# Patient Record
Sex: Male | Born: 1965
Health system: Southern US, Community
[De-identification: ages and names within clinical notes are randomized; demographics above are authoritative.]

## PROBLEM LIST (undated history)

## (undated) DIAGNOSIS — D649 Anemia, unspecified: Secondary | ICD-10-CM

## (undated) DIAGNOSIS — M1990 Unspecified osteoarthritis, unspecified site: Secondary | ICD-10-CM

## (undated) DIAGNOSIS — Z86010 Personal history of colonic polyps: Secondary | ICD-10-CM

## (undated) DIAGNOSIS — J45909 Unspecified asthma, uncomplicated: Secondary | ICD-10-CM

## (undated) DIAGNOSIS — E785 Hyperlipidemia, unspecified: Secondary | ICD-10-CM

## (undated) DIAGNOSIS — K603 Anal fistula: Secondary | ICD-10-CM

## (undated) DIAGNOSIS — T8859XA Other complications of anesthesia, initial encounter: Secondary | ICD-10-CM

## (undated) DIAGNOSIS — Z8679 Personal history of other diseases of the circulatory system: Secondary | ICD-10-CM

## (undated) DIAGNOSIS — G4730 Sleep apnea, unspecified: Secondary | ICD-10-CM

## (undated) DIAGNOSIS — H1030 Unspecified acute conjunctivitis, unspecified eye: Secondary | ICD-10-CM

## (undated) DIAGNOSIS — A0472 Enterocolitis due to Clostridium difficile, not specified as recurrent: Secondary | ICD-10-CM

## (undated) DIAGNOSIS — I10 Essential (primary) hypertension: Secondary | ICD-10-CM

## (undated) DIAGNOSIS — J019 Acute sinusitis, unspecified: Secondary | ICD-10-CM

## (undated) DIAGNOSIS — I491 Atrial premature depolarization: Secondary | ICD-10-CM

## (undated) DIAGNOSIS — M109 Gout, unspecified: Secondary | ICD-10-CM

## (undated) HISTORY — DX: Hyperlipidemia, unspecified: E78.5

## (undated) HISTORY — DX: Anal fistula: K60.3

## (undated) HISTORY — DX: Enterocolitis due to Clostridium difficile, not specified as recurrent: A04.72

## (undated) HISTORY — PX: OTHER SURGICAL HISTORY: SHX169

## (undated) HISTORY — DX: Personal history of colonic polyps: Z86.010

## (undated) HISTORY — PX: ANKLE DEBRIDEMENT: SHX1147

## (undated) HISTORY — DX: Essential (primary) hypertension: I10

## (undated) HISTORY — DX: Unspecified acute conjunctivitis, unspecified eye: H10.30

## (undated) HISTORY — DX: Atrial premature depolarization: I49.1

## (undated) HISTORY — DX: Personal history of other diseases of the circulatory system: Z86.79

## (undated) HISTORY — DX: Gout, unspecified: M10.9

## (undated) HISTORY — DX: Acute sinusitis, unspecified: J01.90

---

## 2006-09-10 ENCOUNTER — Encounter: Payer: Self-pay | Admitting: Internal Medicine

## 2006-10-09 ENCOUNTER — Encounter: Payer: Self-pay | Admitting: Internal Medicine

## 2008-05-24 ENCOUNTER — Encounter: Payer: Self-pay | Admitting: Internal Medicine

## 2008-07-04 ENCOUNTER — Encounter: Payer: Self-pay | Admitting: Internal Medicine

## 2008-09-28 ENCOUNTER — Encounter: Payer: Self-pay | Admitting: Internal Medicine

## 2008-09-29 ENCOUNTER — Encounter: Payer: Self-pay | Admitting: Internal Medicine

## 2009-03-16 ENCOUNTER — Encounter: Payer: Self-pay | Admitting: Internal Medicine

## 2009-05-18 ENCOUNTER — Encounter: Payer: Self-pay | Admitting: Internal Medicine

## 2009-08-23 ENCOUNTER — Encounter: Payer: Self-pay | Admitting: Internal Medicine

## 2009-08-23 LAB — CONVERTED CEMR LAB: PSA: NORMAL ng/mL

## 2009-12-21 ENCOUNTER — Telehealth: Payer: Self-pay | Admitting: Internal Medicine

## 2009-12-21 ENCOUNTER — Ambulatory Visit: Payer: Self-pay | Admitting: Internal Medicine

## 2009-12-21 DIAGNOSIS — Z860100 Personal history of colon polyps, unspecified: Secondary | ICD-10-CM | POA: Insufficient documentation

## 2009-12-21 DIAGNOSIS — K603 Anal fistula, unspecified: Secondary | ICD-10-CM

## 2009-12-21 DIAGNOSIS — E785 Hyperlipidemia, unspecified: Secondary | ICD-10-CM | POA: Insufficient documentation

## 2009-12-21 DIAGNOSIS — M109 Gout, unspecified: Secondary | ICD-10-CM

## 2009-12-21 DIAGNOSIS — I10 Essential (primary) hypertension: Secondary | ICD-10-CM

## 2009-12-21 DIAGNOSIS — I491 Atrial premature depolarization: Secondary | ICD-10-CM

## 2009-12-21 HISTORY — DX: Atrial premature depolarization: I49.1

## 2009-12-21 HISTORY — DX: Essential (primary) hypertension: I10

## 2009-12-21 HISTORY — DX: Hyperlipidemia, unspecified: E78.5

## 2009-12-21 HISTORY — DX: Personal history of colon polyps, unspecified: Z86.0100

## 2009-12-21 HISTORY — DX: Personal history of colonic polyps: Z86.010

## 2009-12-21 HISTORY — DX: Anal fistula: K60.3

## 2009-12-21 HISTORY — DX: Gout, unspecified: M10.9

## 2009-12-21 HISTORY — DX: Anal fistula, unspecified: K60.30

## 2009-12-21 LAB — CONVERTED CEMR LAB
AST: 41 units/L — ABNORMAL HIGH (ref 0–37)
Alkaline Phosphatase: 56 units/L (ref 39–117)
Bilirubin, Direct: 0.1 mg/dL (ref 0.0–0.3)
Cholesterol: 251 mg/dL — ABNORMAL HIGH (ref 0–200)
Total Bilirubin: 0.6 mg/dL (ref 0.3–1.2)
Total CHOL/HDL Ratio: 6
Uric Acid, Serum: 5.3 mg/dL (ref 4.0–7.8)
VLDL: 179.6 mg/dL — ABNORMAL HIGH (ref 0.0–40.0)

## 2010-01-23 ENCOUNTER — Ambulatory Visit: Payer: Self-pay | Admitting: Internal Medicine

## 2010-01-23 LAB — CONVERTED CEMR LAB
AST: 28 units/L (ref 0–37)
Alkaline Phosphatase: 43 units/L (ref 39–117)
Bilirubin, Direct: 0.2 mg/dL (ref 0.0–0.3)
Cholesterol: 157 mg/dL (ref 0–200)
LDL Cholesterol: 81 mg/dL (ref 0–99)
Total Protein: 6.6 g/dL (ref 6.0–8.3)

## 2010-01-24 LAB — CONVERTED CEMR LAB
HCV Ab: NEGATIVE
Hep A IgM: NEGATIVE

## 2010-04-11 ENCOUNTER — Ambulatory Visit: Payer: Self-pay | Admitting: Internal Medicine

## 2010-04-11 DIAGNOSIS — J0101 Acute recurrent maxillary sinusitis: Secondary | ICD-10-CM | POA: Insufficient documentation

## 2010-04-11 DIAGNOSIS — H1030 Unspecified acute conjunctivitis, unspecified eye: Secondary | ICD-10-CM | POA: Insufficient documentation

## 2010-04-11 DIAGNOSIS — J019 Acute sinusitis, unspecified: Secondary | ICD-10-CM

## 2010-04-11 HISTORY — DX: Unspecified acute conjunctivitis, unspecified eye: H10.30

## 2010-04-11 HISTORY — DX: Acute sinusitis, unspecified: J01.90

## 2010-04-19 ENCOUNTER — Ambulatory Visit: Payer: Self-pay | Admitting: Internal Medicine

## 2010-04-19 DIAGNOSIS — A0472 Enterocolitis due to Clostridium difficile, not specified as recurrent: Secondary | ICD-10-CM

## 2010-04-19 HISTORY — DX: Enterocolitis due to Clostridium difficile, not specified as recurrent: A04.72

## 2010-04-20 LAB — CONVERTED CEMR LAB
ALT: 41 units/L (ref 0–53)
Basophils Relative: 1 % (ref 0.0–3.0)
Bilirubin Urine: NEGATIVE
Chloride: 108 meq/L (ref 96–112)
Eosinophils Relative: 2.6 % (ref 0.0–5.0)
HCT: 38 % — ABNORMAL LOW (ref 39.0–52.0)
Hemoglobin, Urine: NEGATIVE
Leukocytes, UA: NEGATIVE
Lipase: 13 units/L (ref 11.0–59.0)
Lymphs Abs: 2.2 10*3/uL (ref 0.7–4.0)
MCV: 91.9 fL (ref 78.0–100.0)
Monocytes Absolute: 0.4 10*3/uL (ref 0.1–1.0)
Neutro Abs: 3.3 10*3/uL (ref 1.4–7.7)
Nitrite: NEGATIVE
Potassium: 4.4 meq/L (ref 3.5–5.1)
RBC: 4.14 M/uL — ABNORMAL LOW (ref 4.22–5.81)
Total Protein, Urine: NEGATIVE mg/dL
Total Protein: 6.8 g/dL (ref 6.0–8.3)
WBC: 6.1 10*3/uL (ref 4.5–10.5)

## 2010-04-24 ENCOUNTER — Encounter: Payer: Self-pay | Admitting: Internal Medicine

## 2010-08-09 ENCOUNTER — Ambulatory Visit: Payer: Self-pay | Admitting: Internal Medicine

## 2010-08-09 ENCOUNTER — Encounter: Payer: Self-pay | Admitting: Internal Medicine

## 2010-08-09 LAB — CONVERTED CEMR LAB
BUN: 17 mg/dL (ref 6–23)
Basophils Absolute: 0.1 10*3/uL (ref 0.0–0.1)
Bilirubin, Direct: 0.1 mg/dL (ref 0.0–0.3)
Chloride: 107 meq/L (ref 96–112)
Creatinine, Ser: 0.8 mg/dL (ref 0.4–1.5)
Eosinophils Absolute: 0.3 10*3/uL (ref 0.0–0.7)
Eosinophils Relative: 4.2 % (ref 0.0–5.0)
HDL: 43.5 mg/dL (ref >39.00)
Hemoglobin, Urine: NEGATIVE
Ketones, ur: NEGATIVE mg/dL
MCV: 90.2 fL (ref 78.0–100.0)
Monocytes Absolute: 0.4 10*3/uL (ref 0.1–1.0)
Neutrophils Relative %: 60.1 % (ref 43.0–77.0)
Platelets: 266 10*3/uL (ref 150.0–400.0)
TSH: 1.16 microintl units/mL (ref 0.35–5.50)
Total Bilirubin: 0.5 mg/dL (ref 0.3–1.2)
Total CHOL/HDL Ratio: 4
Triglycerides: 239 mg/dL — ABNORMAL HIGH (ref 0.0–149.0)
Urine Glucose: NEGATIVE mg/dL
Urobilinogen, UA: 0.2 (ref 0.0–1.0)
VLDL: 47.8 mg/dL — ABNORMAL HIGH (ref 0.0–40.0)
WBC: 7.1 10*3/uL (ref 4.5–10.5)

## 2010-12-24 NOTE — Letter (Signed)
Summary: Tereasa Coop MD  Tereasa Coop MD   Imported By: Sherian Rein 12/25/2009 14:46:42  _____________________________________________________________________  External Attachment:    Type:   Image     Comment:   External Document

## 2010-12-24 NOTE — Assessment & Plan Note (Signed)
Summary: CPX / [facility]  #   Vital Signs:  Patient profile:   45 year old male Height:      71 inches Weight:      263.13 pounds BMI:     36.83 O2 Sat:      95 % on Room air Temp:     98.5 degrees F oral Pulse rate:   59 / minute BP sitting:   100 / 70  (left arm) Cuff size:   large  Vitals Entered By: Zella Ball Ewing CMA Duncan Dull) (August 09, 2010 8:41 AM)  O2 Flow:  Room air  CC: Adult Physical/RE   CC:  Adult Physical/RE.  History of Present Illness: here for wellness; crestor costs $500 for 3 mo;  Pt denies CP, worsening sob, doe, wheezing, orthopnea, pnd, worsening LE edema, palps, dizziness or syncope Pt denies new neuro symptoms such as headache, facial or extremity weakness  No fever, wt loss, night sweats, loss of appetite or other constitutional symptoms  No overt OSA symptoms.   Problems Prior to Update: 1)  Preventive Health Care  (ICD-V70.0) 2)  Clostridium Difficile Colitis  (ICD-008.45) 3)  Sinusitis- Acute-nos  (ICD-461.9) 4)  Conjunctivitis, Acute  (ICD-372.00) 5)  Hepatotoxicity, Drug-induced, Risk of  (ICD-V58.69) 6)  Anal Fistula  (ICD-565.1) 7)  Colonic Polyps, Hx of  (ICD-V12.72) 8)  Pac  (ICD-427.61) 9)  Hypertension  (ICD-401.9) 10)  Hyperlipidemia  (ICD-272.4) 11)  Gout  (ICD-274.9)  Medications Prior to Update: 1)  Ambien 10 Mg Tabs (Zolpidem Tartrate) .Marland Kitchen.. 1 By Mouth As Needed 2)  Lisinopril 10 Mg Tabs (Lisinopril) .Marland Kitchen.. 1 By Mouth Once Daily 3)  Allopurinol 300 Mg Tabs (Allopurinol) .Marland Kitchen.. 1 By Mouth Once Daily 4)  Crestor 10 Mg Tabs (Rosuvastatin Calcium) .Marland Kitchen.. 1 By Mouth Once Daily 5)  Metoprolol Tartrate 50 Mg Tabs (Metoprolol Tartrate) .Marland Kitchen.. 1 By Mouth Once Daily 6)  Aspir-Low 81 Mg Tbec (Aspirin) .Marland Kitchen.. 1 By Mouth Once Daily 7)  Daily Multiple Vitamins  Tabs (Multiple Vitamin) .Marland Kitchen.. 1 By Mouth Once Daily 8)  Fenofibrate 160 Mg Tabs (Fenofibrate) .Marland Kitchen.. 1 By Mouth Once Daily 9)  Fish Oil 1000 Mg Caps (Omega-3 Fatty Acids) .Marland Kitchen.. 1 By Mouth Once Daily 10)   Metronidazole 250 Mg Tabs (Metronidazole) .Marland Kitchen.. 1 By Mouth Four Times Per Day 11)  Vancocin Hcl 250 Mg Caps (Vancomycin Hcl) .Marland Kitchen.. 1 By Mouth Four Times Per Day For 14 Days 12)  Promethazine Hcl 25 Mg Supp (Promethazine Hcl) .Marland Kitchen.. 1 Pr Q 6 Hrs As Needed Nausea  Current Medications (verified): 1)  Zolpidem Tartrate 10 Mg Tabs (Zolpidem Tartrate) .Marland Kitchen.. 1 By Mouth At Bedtime As Needed 2)  Lisinopril 10 Mg Tabs (Lisinopril) .Marland Kitchen.. 1 By Mouth Once Daily 3)  Allopurinol 300 Mg Tabs (Allopurinol) .Marland Kitchen.. 1 By Mouth Once Daily 4)  Simvastatin 40 Mg Tabs (Simvastatin) .Marland Kitchen.. 1po Once Daily 5)  Metoprolol Tartrate 50 Mg Tabs (Metoprolol Tartrate) .... 1/2 By Mouth Once Daily 6)  Aspir-Low 81 Mg Tbec (Aspirin) .Marland Kitchen.. 1 By Mouth Once Daily 7)  Daily Multiple Vitamins  Tabs (Multiple Vitamin) .Marland Kitchen.. 1 By Mouth Once Daily 8)  Fenofibrate 160 Mg Tabs (Fenofibrate) .Marland Kitchen.. 1 By Mouth Once Daily 9)  Fish Oil 1000 Mg Caps (Omega-3 Fatty Acids) .Marland Kitchen.. 1 By Mouth Once Daily  Allergies (verified): 1)  * Avelox  Past History:  Past Medical History: Last updated: 12/21/2009 Gout Hyperlipidemia Hypertension hx of C Diff colitis after avelox 2009 PAC's PFT's normal 2007 Colonic polyps, hx of  hx of pneumonia chronic right shoulder pain   Past Surgical History: Last updated: 12/21/2009 s/p anal lateral inferior sphincterotomy from c diff, subsequent fistula s/p right ankle ORIF about 45yo  Family History: Last updated: 12/21/2009 adopted; biol mother native [place], father cuacasion  Social History: Last updated: 12/21/2009 Single no children work - Therapist, music - Arts development officer Never Smoked Alcohol use-yes Drug use-no  Risk Factors: Smoking Status: never (12/21/2009)  Review of Systems  The patient denies anorexia, fever, vision loss, decreased hearing, hoarseness, chest pain, syncope, dyspnea on exertion, peripheral edema, prolonged cough, headaches, hemoptysis, abdominal pain, melena, hematochezia,  severe indigestion/heartburn, hematuria, muscle weakness, suspicious skin lesions, transient blindness, difficulty walking, depression, unusual weight change, abnormal bleeding, enlarged lymph nodes, and angioedema.         all otherwise negative per pt -    Physical Exam  General:  alert and overweight-appearing. Head:  normocephalic and atraumatic.   Eyes:  vision grossly intact, pupils equal, and pupils round.   Ears:  R ear normal and L ear normal.   Nose:  no external deformity and no nasal discharge.   Mouth:  no gingival abnormalities and pharyngeal erythema.   Neck:  supple and no masses.   Lungs:  normal respiratory effort and normal breath sounds.   Heart:  normal rate and regular rhythm.   Abdomen:  soft, normal bowel sounds, no distention, no masses, and no guarding Msk:  no joint tenderness and no joint swelling.   Extremities:  no edema, no erythema  Neurologic:  cranial nerves II-XII intact and strength normal in all extremities.   Skin:  color normal and no rashes.   Psych:  not anxious appearing and not depressed appearing.     Impression & Recommendations:  Problem # 1:  Preventive Health Care (ICD-V70.0)  Overall doing well, age appropriate education and counseling updated and referral for appropriate preventive services done unless declined, immunizations up to date or declined, diet counseling done if overweight, urged to quit smoking if smokes , most recent labs reviewed and current ordered if appropriate, ecg reviewed or declined (interpretation per ECG scanned in the EMR if done); information regarding Medicare Prevention requirements given if appropriate; speciality referrals updated as appropriate   Orders: TLB-BMP (Basic Metabolic Panel-BMET) (80048-METABOL) TLB-CBC Platelet - w/Differential (85025-CBCD) TLB-Hepatic/Liver Function Pnl (80076-HEPATIC) TLB-Lipid Panel (80061-LIPID) TLB-TSH (Thyroid Stimulating Hormone) (84443-TSH) TLB-PSA (Prostate Specific  Antigen) (84153-PSA) TLB-Udip ONLY (81003-UDIP)  Problem # 2:  HYPERLIPIDEMIA (ICD-272.4)  His updated medication list for this problem includes:    Simvastatin 40 Mg Tabs (Simvastatin) .Marland Kitchen... 1po once daily    Fenofibrate 160 Mg Tabs (Fenofibrate) .Marland Kitchen... 1 by mouth once daily ok to change to simvastatin due to cost  Labs Reviewed: SGOT: 32 (04/19/2010)   SGPT: 41 (04/19/2010)   HDL:51.80 (01/23/2010), 45.50 (12/21/2009)  LDL:81 (01/23/2010)  Chol:157 (01/23/2010), 251 (12/21/2009)  Trig:120.0 (01/23/2010), 898.0 (12/21/2009)  Problem # 3:  HYPERTENSION (ICD-401.9)  His updated medication list for this problem includes:    Lisinopril 10 Mg Tabs (Lisinopril) .Marland Kitchen... 1 by mouth once daily    Metoprolol Tartrate 50 Mg Tabs (Metoprolol tartrate) .Marland Kitchen... 1/2 by mouth once daily  BP today: 100/70 Prior BP: 110/72 (04/19/2010)  Labs Reviewed: K+: 4.4 (04/19/2010) Creat: : 0.8 (04/19/2010)   Chol: 157 (01/23/2010)   HDL: 51.80 (01/23/2010)   LDL: 81 (01/23/2010)   TG: 120.0 (01/23/2010) ? overcontrolled with home fatigue - ok to decrease the metoprolol to 265 mg per day  Complete  Medication List: 1)  Zolpidem Tartrate 10 Mg Tabs (Zolpidem tartrate) .Marland Kitchen.. 1 by mouth at bedtime as needed 2)  Lisinopril 10 Mg Tabs (Lisinopril) .Marland Kitchen.. 1 by mouth once daily 3)  Allopurinol 300 Mg Tabs (Allopurinol) .Marland Kitchen.. 1 by mouth once daily 4)  Simvastatin 40 Mg Tabs (Simvastatin) .Marland Kitchen.. 1po once daily 5)  Metoprolol Tartrate 50 Mg Tabs (Metoprolol tartrate) .... 1/2 by mouth once daily 6)  Aspir-low 81 Mg Tbec (Aspirin) .Marland Kitchen.. 1 by mouth once daily 7)  Daily Multiple Vitamins Tabs (Multiple vitamin) .Marland Kitchen.. 1 by mouth once daily 8)  Fenofibrate 160 Mg Tabs (Fenofibrate) .Marland Kitchen.. 1 by mouth once daily 9)  Fish Oil 1000 Mg Caps (Omega-3 fatty acids) .Marland Kitchen.. 1 by mouth once daily  Other Orders: EKG w/ Interpretation (93000) Admin 1st Vaccine (16109) Flu Vaccine 26yrs + (60454) TLB-Uric Acid, Blood (84550-URIC)  Patient  Instructions: 1)  you had  the flu shot today 2)  stop the crestor 3)  start the simvastatin 40 mg per day 4)  Please go to the Lab in the basement for your blood and/or urine tests today 5)  Please call the number on the [facility] Card for results of your testing  6)  Please schedule a follow-up appointment in 1 year or sooner if needed Prescriptions: FENOFIBRATE 160 MG TABS (FENOFIBRATE) 1 by mouth once daily  #90 x 3   Entered and Authorized by:   Corwin Levins MD   Signed by:   Corwin Levins MD on 08/09/2010   Method used:   Print then Give to Patient   RxID:   [id number] ALLOPURINOL 300 MG TABS (ALLOPURINOL) 1 by mouth once daily  #90 x 3   Entered and Authorized by:   Corwin Levins MD   Signed by:   Corwin Levins MD on 08/09/2010   Method used:   Print then Give to Patient   RxID:   [id number] METOPROLOL TARTRATE 50 MG TABS (METOPROLOL TARTRATE) 1/2 by mouth once daily  #45 x 3   Entered and Authorized by:   Corwin Levins MD   Signed by:   Corwin Levins MD on 08/09/2010   Method used:   Print then Give to Patient   RxID:   [id number] LISINOPRIL 10 MG TABS (LISINOPRIL) 1 by mouth once daily  #90 x 3   Entered and Authorized by:   Corwin Levins MD   Signed by:   Corwin Levins MD on 08/09/2010   Method used:   Print then Give to Patient   RxID:   [id number] ZOLPIDEM TARTRATE 10 MG TABS (ZOLPIDEM TARTRATE) 1 by mouth at bedtime as needed  #90 x 1   Entered and Authorized by:   Corwin Levins MD   Signed by:   Corwin Levins MD on 08/09/2010   Method used:   Print then Give to Patient   RxID:   [id number] SIMVASTATIN 40 MG TABS (SIMVASTATIN) 1po once daily  #90 x 3   Entered and Authorized by:   Corwin Levins MD   Signed by:   Corwin Levins MD on 08/09/2010   Method used:   Print then Give to Patient   RxID:   [id number]      Flu Vaccine Consent Questions     Do you have a history of severe allergic reactions to this vaccine? no    Any prior history of  allergic reactions to egg and/or gelatin? no  Do you have a sensitivity to the preservative Thimersol? no    Do you have a past history of Guillan-Barre Syndrome? no    Do you currently have an acute febrile illness? no    Have you ever had a severe reaction to latex? no    Vaccine information given and explained to patient? yes    Are you currently pregnant? no    Lot Number:[id number]   Exp Date:05/24/2011   Site Given  Right Deltoid IMu

## 2010-12-24 NOTE — Assessment & Plan Note (Signed)
Summary: R FACE SWELLING--EYE INFECTION ???---STC   Vital Signs:  Patient profile:   45 year old male Height:      71 inches Weight:      261 pounds BMI:     36.53 O2 Sat:      97 % on Room air Temp:     99.5 degrees F oral Pulse rate:   69 / minute BP sitting:   124 / 74  (left arm) Cuff size:   large  Vitals Entered ByZella Ball Ewing (Apr 11, 2010 2:35 PM)  O2 Flow:  Room air  CC: Right eye swollen and red. Sinus pain/RE   CC:  Right eye swollen and red. Sinus pain/RE.  History of Present Illness: here wtih mild to mod acute onset 3 days facial pain, pressure, fever  and greenish d/c; with slight ST and mild nonprod cough, but also right eye matting this AM and noticed a tender white area to the inner aspect of the lower lid  - tender but not draining, and not swollen.  Pt denies CP, sob, doe, wheezing, orthopnea, pnd, worsening LE edema, palps, dizziness or syncope     Problems Prior to Update: 1)  Sinusitis- Acute-nos  (ICD-461.9) 2)  Conjunctivitis, Acute  (ICD-372.00) 3)  Hepatotoxicity, Drug-induced, Risk of  (ICD-V58.69) 4)  Anal Fistula  (ICD-565.1) 5)  Colonic Polyps, Hx of  (ICD-V12.72) 6)  Pac  (ICD-427.61) 7)  Hypertension  (ICD-401.9) 8)  Hyperlipidemia  (ICD-272.4) 9)  Gout  (ICD-274.9)  Medications Prior to Update: 1)  Ambien 10 Mg Tabs (Zolpidem Tartrate) .Marland Kitchen.. 1 By Mouth As Needed 2)  Lisinopril 10 Mg Tabs (Lisinopril) .Marland Kitchen.. 1 By Mouth Once Daily 3)  Allopurinol 300 Mg Tabs (Allopurinol) .Marland Kitchen.. 1 By Mouth Once Daily 4)  Crestor 10 Mg Tabs (Rosuvastatin Calcium) .Marland Kitchen.. 1 By Mouth Once Daily 5)  Metoprolol Tartrate 50 Mg Tabs (Metoprolol Tartrate) .Marland Kitchen.. 1 By Mouth Once Daily 6)  Aspir-Low 81 Mg Tbec (Aspirin) .Marland Kitchen.. 1 By Mouth Once Daily 7)  Daily Multiple Vitamins  Tabs (Multiple Vitamin) .Marland Kitchen.. 1 By Mouth Once Daily 8)  Fenofibrate 160 Mg Tabs (Fenofibrate) .Marland Kitchen.. 1 By Mouth Once Daily  Current Medications (verified): 1)  Ambien 10 Mg Tabs (Zolpidem Tartrate) .Marland Kitchen..  1 By Mouth As Needed 2)  Lisinopril 10 Mg Tabs (Lisinopril) .Marland Kitchen.. 1 By Mouth Once Daily 3)  Allopurinol 300 Mg Tabs (Allopurinol) .Marland Kitchen.. 1 By Mouth Once Daily 4)  Crestor 10 Mg Tabs (Rosuvastatin Calcium) .Marland Kitchen.. 1 By Mouth Once Daily 5)  Metoprolol Tartrate 50 Mg Tabs (Metoprolol Tartrate) .Marland Kitchen.. 1 By Mouth Once Daily 6)  Aspir-Low 81 Mg Tbec (Aspirin) .Marland Kitchen.. 1 By Mouth Once Daily 7)  Daily Multiple Vitamins  Tabs (Multiple Vitamin) .Marland Kitchen.. 1 By Mouth Once Daily 8)  Fenofibrate 160 Mg Tabs (Fenofibrate) .Marland Kitchen.. 1 By Mouth Once Daily 9)  Fish Oil 1000 Mg Caps (Omega-3 Fatty Acids) .Marland Kitchen.. 1 By Mouth Once Daily 10)  Azithromycin 250 Mg Tabs (Azithromycin) .... 2po Qd For 1 Day, Then 1po Qd For 4days, Then Stop 11)  Erythromycin 5 Mg/gm Oint (Erythromycin) .... 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  , mild ill  Head:  normocephalic and atraumatic.   Eyes:  vision grossly intact, pupils equal, and pupils round.   Ears:  bilat tm's red, sinus tender bilat,  right conjunct with erythema and slight d/c, inner mid aspect lower lid with 3 mm area whitis tender area but not palpable from the outer aspect, nonfluctuant, Nose:  nasal dischargemucosal pallor and mucosal edema.   Mouth:   pharyngeal erythema and fair dentition.   Neck:  supple and cervical lymphadenopathy.   Lungs:  normal respiratory effort and normal breath sounds.   Heart:  normal rate and regular rhythm.   Extremities:  no edema, no erythema    Impression & Recommendations:  Problem # 1:  SINUSITIS- ACUTE-NOS (ICD-461.9)  His updated medication list for this problem includes:    Azithromycin 250 Mg Tabs (Azithromycin) .Marland Kitchen... 2po qd for 1 day, then 1po qd for 4days, then stop treat as above, f/u any worsening signs or symptoms   Problem # 2:  CONJUNCTIVITIS, ACUTE (ICD-372.00)  His updated medication list for this problem includes:    Erythromycin 5 Mg/gm Oint (Erythromycin) ..... Use asd four times per day treat as above, f/u any worsening signs or symptoms   Problem # 3:  HYPERTENSION (ICD-401.9)  His updated medication list for this problem includes:    Lisinopril 10 Mg Tabs (Lisinopril) .Marland Kitchen... 1 by mouth once daily    Metoprolol Tartrate 50 Mg Tabs (Metoprolol tartrate) .Marland Kitchen... 1 by mouth once daily  BP today: 124/74 Prior BP: 122/80 (12/21/2009)  Labs Reviewed: Chol: 157 (01/23/2010)   HDL: 51.80 (01/23/2010)   LDL: 81 (01/23/2010)   TG: 120.0 (01/23/2010) stable overall by hx and exam, ok to continue meds/tx as is   Complete Medication List: 1)  Ambien 10 Mg Tabs (Zolpidem tartrate) .Marland Kitchen.. 1 by mouth as needed 2)  Lisinopril 10 Mg Tabs (Lisinopril) .Marland Kitchen.. 1 by mouth once daily 3)  Allopurinol 300 Mg Tabs (Allopurinol) .Marland Kitchen.. 1 by mouth once daily 4)  Crestor 10 Mg Tabs (Rosuvastatin calcium) .Marland Kitchen.. 1 by mouth once daily 5)  Metoprolol Tartrate 50 Mg Tabs (Metoprolol tartrate) .Marland Kitchen.. 1 by mouth once daily 6)  Aspir-low 81 Mg Tbec (Aspirin) .Marland Kitchen.. 1 by mouth once daily 7)  Daily Multiple Vitamins Tabs (Multiple vitamin) .Marland Kitchen.. 1 by mouth once daily 8)  Fenofibrate 160 Mg Tabs (Fenofibrate) .Marland Kitchen.. 1 by mouth once daily 9)  Fish Oil 1000 Mg Caps (Omega-3 fatty acids) .Marland Kitchen.. 1 by mouth once daily 10)   Azithromycin 250 Mg Tabs (Azithromycin) .... 2po qd for 1 day, then 1po qd for 4days, then stop 11)  Erythromycin 5 Mg/gm Oint (Erythromycin) .... Use asd four times per day  Patient Instructions: 1)  Please take all new medications as prescribed 2)  Continue all previous medications as before this visit  3)  Please schedule a follow-up appointment in 4 months with CPX labs Prescriptions: ERYTHROMYCIN 5 MG/GM OINT (ERYTHROMYCIN) use asd four times per day  #1 x 1   Entered and Authorized by:   Corwin Levins MD   Signed by:   Corwin Levins MD on 04/11/2010  Method used:   Print then Give to Patient   RxID:   [id number] AZITHROMYCIN 250 MG TABS (AZITHROMYCIN) 2po qd for 1 day, then 1po qd for 4days, then stop  #6 x 1   Entered and Authorized by:   Corwin Levins MD   Signed by:   Corwin Levins MD on 04/11/2010   Method used:   Print then Give to Patient   RxID:   [id number]

## 2010-12-24 NOTE — Letter (Signed)
Summary: H&P/George Esham MD  H&P/George Esham MD   Imported By: Sherian Rein 12/25/2009 14:45:41  _____________________________________________________________________  External Attachment:    Type:   Image     Comment:   External Document

## 2010-12-24 NOTE — Assessment & Plan Note (Signed)
Summary: INFECTION/NWS   Vital Signs:  Patient profile:   45 year old male Height:      71 inches Weight:      261 pounds BMI:     36.53 O2 Sat:      98 % on Room air Temp:     98.7 degrees F oral Pulse rate:   50 / minute BP sitting:   110 / 72  (left arm) Cuff size:   large  Vitals Entered ByZella Ball Ewing (Apr 19, 2010 1:55 PM)  O2 Flow:  Room air CC: Infection/RE   CC:  Infection/RE.  History of Present Illness: here after zpack course with onset 1-2 days general weakness, malaise , low grade temp, diffuse abd pain mild worse lower abd, assoc with multiple episode watery stool foul smelling  and yellowish as well - has had 4 episodes this am alone.  States he may be early in the course but is very concerned due to the very complicated prolonged previous episode ending in surgury as well.  No current vomiting athough has significant nausea and requests phenergan supp as this did help 2009 as well.  No overt bleeding or heme pos stool to date.  Also with loss of appetitie and hot flashes  Problems Prior to Update: 1)  Clostridium Difficile Colitis  (ICD-008.45) 2)  Sinusitis- Acute-nos  (ICD-461.9) 3)  Conjunctivitis, Acute  (ICD-372.00) 4)  Hepatotoxicity, Drug-induced, Risk of  (ICD-V58.69) 5)  Anal Fistula  (ICD-565.1) 6)  Colonic Polyps, Hx of  (ICD-V12.72) 7)  Pac  (ICD-427.61) 8)  Hypertension  (ICD-401.9) 9)  Hyperlipidemia  (ICD-272.4) 10)  Gout  (ICD-274.9)  Medications Prior to Update: 1)  Ambien 10 Mg Tabs (Zolpidem Tartrate) .Marland Kitchen.. 1 By Mouth As Needed 2)  Lisinopril 10 Mg Tabs (Lisinopril) .Marland Kitchen.. 1 By Mouth Once Daily 3)  Allopurinol 300 Mg Tabs (Allopurinol) .Marland Kitchen.. 1 By Mouth Once Daily 4)  Crestor 10 Mg Tabs (Rosuvastatin Calcium) .Marland Kitchen.. 1 By Mouth Once Daily 5)  Metoprolol Tartrate 50 Mg Tabs (Metoprolol Tartrate) .Marland Kitchen.. 1 By Mouth Once Daily 6)  Aspir-Low 81 Mg Tbec (Aspirin) .Marland Kitchen.. 1 By Mouth Once Daily 7)  Daily Multiple Vitamins  Tabs (Multiple Vitamin) .Marland Kitchen.. 1 By  Mouth Once Daily 8)  Fenofibrate 160 Mg Tabs (Fenofibrate) .Marland Kitchen.. 1 By Mouth Once Daily 9)  Fish Oil 1000 Mg Caps (Omega-3 Fatty Acids) .Marland Kitchen.. 1 By Mouth Once Daily 10)  Azithromycin 250 Mg Tabs (Azithromycin) .... 2po Qd For 1 Day, Then 1po Qd For 4days, Then Stop 11)  Erythromycin 5 Mg/gm Oint (Erythromycin) .... Use Asd Four Times Per Day  Current Medications (verified): 1)  Ambien 10 Mg Tabs (Zolpidem Tartrate) .Marland Kitchen.. 1 By Mouth As Needed 2)  Lisinopril 10 Mg Tabs (Lisinopril) .Marland Kitchen.. 1 By Mouth Once Daily 3)  Allopurinol 300 Mg Tabs (Allopurinol) .Marland Kitchen.. 1 By Mouth Once Daily 4)  Crestor 10 Mg Tabs (Rosuvastatin Calcium) .Marland Kitchen.. 1 By Mouth Once Daily 5)  Metoprolol Tartrate 50 Mg Tabs (Metoprolol Tartrate) .Marland Kitchen.. 1 By Mouth Once Daily 6)  Aspir-Low 81 Mg Tbec (Aspirin) .Marland Kitchen.. 1 By Mouth Once Daily 7)  Daily Multiple Vitamins  Tabs (Multiple Vitamin) .Marland Kitchen.. 1 By Mouth Once Daily 8)  Fenofibrate 160 Mg Tabs (Fenofibrate) .Marland Kitchen.. 1 By Mouth Once Daily 9)  Fish Oil 1000 Mg Caps (Omega-3 Fatty Acids) .Marland Kitchen.. 1 By Mouth Once Daily 10)  Metronidazole 250 Mg Tabs (Metronidazole) .Marland Kitchen.. 1 By Mouth Four Times Per Day 11)  Vancocin Hcl 250 Mg Caps (  Vancomycin Hcl) .Marland Kitchen.. 1 By Mouth Four Times Per Day For 14 Days 12)  Promethazine Hcl 25 Mg Supp (Promethazine Hcl) .Marland Kitchen.. 1 Pr Q 6 Hrs As Needed Nausea  Allergies (verified): 1)  * Avelox  Past History:  Past Medical History: Last updated: 12/21/2009 Gout Hyperlipidemia Hypertension hx of C Diff colitis after avelox 2009 PAC's PFT's normal 2007 Colonic polyps, hx of hx of pneumonia chronic right shoulder pain   Past Surgical History: Last updated: 12/21/2009 s/p anal lateral inferior sphincterotomy from c diff, subsequent fistula s/p right ankle ORIF about 45yo  Social History: Last updated: 12/21/2009 Single no children work - Therapist, music - student affairs admin Never Smoked Alcohol use-yes Drug use-no  Risk Factors: Smoking Status: never  (12/21/2009)  Review of Systems       all otherwise negative per pt -    Physical Exam  General:  alert and overweight-appearing., mild ill , uncomfortable feeling Head:  normocephalic and atraumatic.   Eyes:  vision grossly intact, pupils equal, and pupils round.   Ears:  R ear normal and L ear normal.   Nose:  no external deformity and no nasal discharge.   Mouth:  no gingival abnormalities and pharyngeal erythema.   Neck:  supple and no masses.   Lungs:  normal respiratory effort and normal breath sounds.   Heart:  normal rate and regular rhythm.   Abdomen:  soft, normal bowel sounds, no distention, no masses, and no guarding.  but diffuse mild tender to the mid and lower abd bilteral Msk:  no joint tenderness and no joint swelling.   Extremities:  no edema, no erythema  Skin:  color normal and no rashes.     Impression & Recommendations:  Problem # 1:  CLOSTRIDIUM DIFFICILE COLITIS (ICD-008.45)  presumed given hx of similar symptoms  and severe previous prolonged course of tx and assoc surgury  2009 -   for flagyl and vancocin dual med to start, check c diff toxin, routine labs, refer to GI if not immed improved over the next 3 to 4 days  Orders: T-Culture, C-Diff Toxin A/B (01027-25366) T-Stool for O&P (44034-74259) T-Culture, Stool ([id number]) TLB-BMP (Basic Metabolic Panel-BMET) (80048-METABOL) TLB-CBC Platelet - w/Differential (85025-CBCD) TLB-Hepatic/Liver Function Pnl (80076-HEPATIC) TLB-Lipase (83690-LIPASE) TLB-Udip ONLY (81003-UDIP) TLB-Sedimentation Rate (ESR) (85652-ESR)  Problem # 2:  SINUSITIS- ACUTE-NOS (ICD-461.9)  The following medications were removed from the medication list:    Azithromycin 250 Mg Tabs (Azithromycin) .Marland Kitchen... 2po qd for 1 day, then 1po qd for 4days, then stop His updated medication list for this problem includes:    Metronidazole 250 Mg Tabs (Metronidazole) .Marland Kitchen... 1 by mouth four times per day    Vancocin Hcl 250 Mg Caps  (Vancomycin hcl) .Marland Kitchen... 1 by mouth four times per day for 14 days resolved = no further antibx needed at this time  Problem # 3:  HYPERTENSION (ICD-401.9)  His updated medication list for this problem includes:    Lisinopril 10 Mg Tabs (Lisinopril) .Marland Kitchen... 1 by mouth once daily    Metoprolol Tartrate 50 Mg Tabs (Metoprolol tartrate) .Marland Kitchen... 1 by mouth once daily  BP today: 110/72 Prior BP: 124/74 (04/11/2010)  Labs Reviewed: Chol: 157 (01/23/2010)   HDL: 51.80 (01/23/2010)   LDL: 81 (01/23/2010)   TG: 120.0 (01/23/2010) stable overall by hx and exam, ok to continue meds/tx as is   Complete Medication List: 1)  Ambien 10 Mg Tabs (Zolpidem tartrate) .Marland Kitchen.. 1 by mouth as needed 2)  Lisinopril 10 Mg Tabs (  Lisinopril) .Marland Kitchen.. 1 by mouth once daily 3)  Allopurinol 300 Mg Tabs (Allopurinol) .Marland Kitchen.. 1 by mouth once daily 4)  Crestor 10 Mg Tabs (Rosuvastatin calcium) .Marland Kitchen.. 1 by mouth once daily 5)  Metoprolol Tartrate 50 Mg Tabs (Metoprolol tartrate) .Marland Kitchen.. 1 by mouth once daily 6)  Aspir-low 81 Mg Tbec (Aspirin) .Marland Kitchen.. 1 by mouth once daily 7)  Daily Multiple Vitamins Tabs (Multiple vitamin) .Marland Kitchen.. 1 by mouth once daily 8)  Fenofibrate 160 Mg Tabs (Fenofibrate) .Marland Kitchen.. 1 by mouth once daily 9)  Fish Oil 1000 Mg Caps (Omega-3 fatty acids) .Marland Kitchen.. 1 by mouth once daily 10)  Metronidazole 250 Mg Tabs (Metronidazole) .Marland Kitchen.. 1 by mouth four times per day 11)  Vancocin Hcl 250 Mg Caps (Vancomycin hcl) .Marland Kitchen.. 1 by mouth four times per day for 14 days 12)  Promethazine Hcl 25 Mg Supp (Promethazine hcl) .Marland Kitchen.. 1 pr q 6 hrs as needed nausea  Patient Instructions: 1)  Please take all new medications as prescribed 2)  Continue all previous medications as before this visit , except obviously no further azithromycin 3)  Please go to the Lab in the basement for your blood and/or stool  tests today  4)  call on june 1 (or sooner if needed) for any worsening, for GI referral 5)  Please schedule a follow-up appointment as  needed. Prescriptions: PROMETHAZINE HCL 25 MG SUPP (PROMETHAZINE HCL) 1 pr q 6 hrs as needed nausea  #20 x 2   Entered and Authorized by:   Corwin Levins MD   Signed by:   Corwin Levins MD on 04/19/2010   Method used:   Print then Give to Patient   RxID:   [id number] VANCOCIN HCL 250 MG CAPS (VANCOMYCIN HCL) 1 by mouth four times per day for 14 days  #56 x 0   Entered and Authorized by:   Corwin Levins MD   Signed by:   Corwin Levins MD on 04/19/2010   Method used:   Print then Give to Patient   RxID:   [id number] METRONIDAZOLE 250 MG TABS (METRONIDAZOLE) 1 by mouth four times per day  #56 x 0   Entered and Authorized by:   Corwin Levins MD   Signed by:   Corwin Levins MD on 04/19/2010   Method used:   Print then Give to Patient   RxID:   [id number]

## 2010-12-24 NOTE — Assessment & Plan Note (Signed)
Summary: NEW / BCBS / #/ CD   Vital Signs:  Patient profile:   45 year old male Height:      71 inches Weight:      256 pounds BMI:     35.83 O2 Sat:      97 % on Room air Temp:     97.8 degrees F oral Pulse rate:   66 / minute BP sitting:   122 / 80  (left arm) Cuff size:   large  Vitals Entered ByZella Ball Ewing (December 21, 2009 9:34 AM)  O2 Flow:  Room air  Preventive Care Screening  Last Tetanus Booster:    Date:  12/21/2009    Results:  Tdap  PSA:    Date:  08/23/2009    Results:  normal ng/mL  Colonoscopy:    Date:  05/24/2008    Results:  normal   CC: new pt, new BCBS/RE   CC:  new pt and new BCBS/RE.  History of Present Illness: overall doing well, but still has some anal leakage that he does not want surgury eval at this time;  not sure when he is due for f/u colonoscopy;  no gout symptoms;  trying to follow lower chol diet, good complaince with meds,  needs med refills,  Pt denies CP, sob, doe, wheezing, orthopnea, pnd, worsening LE edema, palps, dizziness or syncope   Pt denies new neuro symptoms such as headache, facial or extremity weakness     Preventive Screening-Counseling & Management  Alcohol-Tobacco     Smoking Status: never      Drug Use:  no.    Problems Prior to Update: 1)  Hepatotoxicity, Drug-induced, Risk of  (ICD-V58.69) 2)  Anal Fistula  (ICD-565.1) 3)  Colonic Polyps, Hx of  (ICD-V12.72) 4)  Pac  (ICD-427.61) 5)  Hypertension  (ICD-401.9) 6)  Hyperlipidemia  (ICD-272.4) 7)  Gout  (ICD-274.9)  Medications Prior to Update: 1)  None  Current Medications (verified): 1)  Ambien 10 Mg Tabs (Zolpidem Tartrate) .Marland Kitchen.. 1 By Mouth As Needed 2)  Lisinopril 10 Mg Tabs (Lisinopril) .Marland Kitchen.. 1 By Mouth Once Daily 3)  Allopurinol 300 Mg Tabs (Allopurinol) .Marland Kitchen.. 1 By Mouth Once Daily 4)  Crestor 10 Mg Tabs (Rosuvastatin Calcium) .Marland Kitchen.. 1 By Mouth Once Daily 5)  Metoprolol Tartrate 50 Mg Tabs (Metoprolol Tartrate) .Marland Kitchen.. 1 By Mouth Once Daily 6)   Aspir-Low 81 Mg Tbec (Aspirin) .Marland Kitchen.. 1 By Mouth Once Daily 7)  Daily Multiple Vitamins  Tabs (Multiple Vitamin) .Marland Kitchen.. 1 By Mouth Once Daily  Allergies (verified): No Known Drug Allergies  Past History:  Family History: Last updated: 12/21/2009 adopted; biol mother native [place], father [place]  Social History: Last updated: 12/21/2009 Single no children work - Therapist, music - Arts development officer Never Smoked Alcohol use-yes Drug use-no  Risk Factors: Smoking Status: never (12/21/2009)  Past Medical History: Gout Hyperlipidemia Hypertension hx of C Diff colitis after avelox 2009 PAC's PFT's normal 2007 Colonic polyps, hx of hx of pneumonia chronic right shoulder pain   Past Surgical History: s/p anal lateral inferior sphincterotomy from c diff, subsequent fistula s/p right ankle ORIF about 45yo  Family History: Reviewed history and no changes required. adopted; biol mother native [place], father cuacasion  Social History: Reviewed history and no changes required. Single no children work - Therapist, music - Arts development officer Never Smoked Alcohol use-yes Drug use-no Smoking Status:  never Drug Use:  no  Review of Systems       all  otherwise negative per pt   Physical Exam  General:  alert and overweight-appearing.   Head:  normocephalic and atraumatic.   Eyes:  vision grossly intact, pupils equal, and pupils round.   Ears:  R ear normal and L ear normal.   Nose:  no external deformity and no nasal discharge.   Mouth:  no gingival abnormalities and pharynx pink and moist.   Neck:  supple and no masses.   Lungs:  normal respiratory effort and normal breath sounds.   Heart:  normal rate and regular rhythm.   Abdomen:  soft, non-tender, and normal bowel sounds.   Rectal:  deferred Msk:  no joint tenderness and no joint swelling.   Extremities:  no edema, no erythema    Impression & Recommendations:  Problem # 1:  HYPERLIPIDEMIA (ICD-272.4)  His  updated medication list for this problem includes:    Crestor 10 Mg Tabs (Rosuvastatin calcium) .Marland Kitchen... 1 by mouth once daily  Orders: TLB-Lipid Panel (80061-LIPID) treat as above, f/u any worsening signs or symptoms , Pt to continue diet efforts, good med tolerance; to check labs - goal LDL less than 70   Problem # 2:  ANAL FISTULA (ICD-565.1) still mild sympt with leakage;  declines referral to GI and surgury  Problem # 3:  COLONIC POLYPS, HX OF (ICD-V12.72) pt to call prev GI to find out pathology and/or rec for f/u colonoscopy  Problem # 4:  HYPERTENSION (ICD-401.9)  His updated medication list for this problem includes:    Lisinopril 10 Mg Tabs (Lisinopril) .Marland Kitchen... 1 by mouth once daily    Metoprolol Tartrate 50 Mg Tabs (Metoprolol tartrate) .Marland Kitchen... 1 by mouth once daily treat as above, f/u any worsening signs or symptoms   BP today: 122/80  Problem # 5:  GOUT (ICD-274.9)  His updated medication list for this problem includes:    Allopurinol 300 Mg Tabs (Allopurinol) .Marland Kitchen... 1 by mouth once daily to check uric acid level  Orders: TLB-Uric Acid, Blood (84550-URIC)  Complete Medication List: 1)  Ambien 10 Mg Tabs (Zolpidem tartrate) .Marland Kitchen.. 1 by mouth as needed 2)  Lisinopril 10 Mg Tabs (Lisinopril) .Marland Kitchen.. 1 by mouth once daily 3)  Allopurinol 300 Mg Tabs (Allopurinol) .Marland Kitchen.. 1 by mouth once daily 4)  Crestor 10 Mg Tabs (Rosuvastatin calcium) .Marland Kitchen.. 1 by mouth once daily 5)  Metoprolol Tartrate 50 Mg Tabs (Metoprolol tartrate) .Marland Kitchen.. 1 by mouth once daily 6)  Aspir-low 81 Mg Tbec (Aspirin) .Marland Kitchen.. 1 by mouth once daily 7)  Daily Multiple Vitamins Tabs (Multiple vitamin) .Marland Kitchen.. 1 by mouth once daily  Other Orders: Tdap => 54yrs IM (66063) Admin 1st Vaccine (01601) TLB-Hepatic/Liver Function Pnl (80076-HEPATIC)   Patient Instructions: 1)  please call your previous GI to find out pathology and/or recomendation for f/u colonoscopy 2)  Please go to the Lab in the basement for your blood and/or  urine tests today  3)  You are given the refills today 4)  Please schedule a follow-up appointment in Sept 2011 with CPX labs and: 5)  uric acid: 274.9 Prescriptions: METOPROLOL TARTRATE 50 MG TABS (METOPROLOL TARTRATE) 1 by mouth once daily  #90 x 3   Entered and Authorized by:   Corwin Levins MD   Signed by:   Corwin Levins MD on 12/21/2009   Method used:   Print then Give to Patient   RxID:   [id number] CRESTOR 10 MG TABS (ROSUVASTATIN CALCIUM) 1 by mouth once daily  #90 x 3  Entered and Authorized by:   Corwin Levins MD   Signed by:   Corwin Levins MD on 12/21/2009   Method used:   Print then Give to Patient   RxID:   [id number] ALLOPURINOL 300 MG TABS (ALLOPURINOL) 1 by mouth once daily  #90 x 3   Entered and Authorized by:   Corwin Levins MD   Signed by:   Corwin Levins MD on 12/21/2009   Method used:   Print then Give to Patient   RxID:   [id number] LISINOPRIL 10 MG TABS (LISINOPRIL) 1 by mouth once daily  #90 x 3   Entered and Authorized by:   Corwin Levins MD   Signed by:   Corwin Levins MD on 12/21/2009   Method used:   Print then Give to Patient   RxID:   [id number] AMBIEN 10 MG TABS (ZOLPIDEM TARTRATE) 1 by mouth as needed  #90 x 1   Entered and Authorized by:   Corwin Levins MD   Signed by:   Corwin Levins MD on 12/21/2009   Method used:   Print then Give to Patient   RxID:   [id number]    Immunization History:  Influenza Immunization History:    Influenza:  historical (08/24/2009)  Immunizations Administered:  Tetanus Vaccine:    Vaccine Type: Tdap    Site: right deltoid    Mfr: GlaxoSmithKline    Dose: 0.5 ml    Route: IM    Given by: Robin Ewing    Exp. Date: 01/19/2012    Lot #: [id number]    VIS given: 10/12/07 version given December 21, 2009.

## 2010-12-24 NOTE — Letter (Signed)
Summary: [facility]  [facility]   Imported By: Sherian Rein 12/25/2009 14:44:33  _____________________________________________________________________  External Attachment:    Type:   Image     Comment:   External Document

## 2010-12-24 NOTE — Progress Notes (Signed)
  Phone Note Refill Request  on December 21, 2009 4:58 PM  Refills Requested: Medication #1:  FENOFIBRATE 160 MG TABS 1 by mouth once daily.   Dosage confirmed as above?Dosage Confirmed   Notes: [facility] Initial call taken by: Scharlene Gloss,  December 21, 2009 4:58 PM    Prescriptions: FENOFIBRATE 160 MG TABS (FENOFIBRATE) 1 by mouth once daily  #90 x 3   Entered by:   Scharlene Gloss   Authorized by:   Corwin Levins MD   Signed by:   Scharlene Gloss on 12/21/2009   Method used:   Faxed to ...       [facility]* (mail-order)             ,          Ph: [phone]       Fax: [phone]   RxID:   [id number]

## 2011-04-03 ENCOUNTER — Encounter: Payer: Self-pay | Admitting: Internal Medicine

## 2011-04-04 ENCOUNTER — Ambulatory Visit (INDEPENDENT_AMBULATORY_CARE_PROVIDER_SITE_OTHER): Payer: BC Managed Care – PPO | Admitting: Internal Medicine

## 2011-04-04 ENCOUNTER — Encounter: Payer: Self-pay | Admitting: Internal Medicine

## 2011-04-04 VITALS — BP 100/68 | HR 60 | Temp 98.1°F | Ht 71.0 in | Wt 252.4 lb

## 2011-04-04 DIAGNOSIS — R21 Rash and other nonspecific skin eruption: Secondary | ICD-10-CM

## 2011-04-04 DIAGNOSIS — R062 Wheezing: Secondary | ICD-10-CM

## 2011-04-04 DIAGNOSIS — J209 Acute bronchitis, unspecified: Secondary | ICD-10-CM | POA: Insufficient documentation

## 2011-04-04 DIAGNOSIS — Z0000 Encounter for general adult medical examination without abnormal findings: Secondary | ICD-10-CM | POA: Insufficient documentation

## 2011-04-04 DIAGNOSIS — K603 Anal fistula: Secondary | ICD-10-CM

## 2011-04-04 MED ORDER — SULFACETAMIDE SODIUM-SULFUR 10-5 % EX LOTN: 1.0000 "application " | TOPICAL_LOTION | Freq: Two times a day (BID) | CUTANEOUS | Status: DC | PRN

## 2011-04-04 MED ORDER — PREDNISONE 10 MG PO TABS: 10.0000 mg | ORAL_TABLET | Freq: Every day | ORAL | Status: AC

## 2011-04-04 MED ORDER — LEVOFLOXACIN 500 MG PO TABS: 500.0000 mg | ORAL_TABLET | Freq: Every day | ORAL | Status: AC

## 2011-04-04 MED ORDER — KETOCONAZOLE 2 % EX CREA: TOPICAL_CREAM | Freq: Every day | CUTANEOUS | Status: DC

## 2011-04-04 NOTE — Patient Instructions (Signed)
Take all new medications as prescribed Continue all other medications as before You will be contacted regarding the referral for: Colorectal Surgeon, [facility]

## 2011-04-06 ENCOUNTER — Encounter: Payer: Self-pay | Admitting: Internal Medicine

## 2011-04-06 NOTE — Assessment & Plan Note (Signed)
Mild to mod, for antibx course,  to f/u any worsening symptoms or concerns 

## 2011-04-06 NOTE — Assessment & Plan Note (Signed)
Mild, declines inhaler, but will tx with predpack,  to f/u any worsening symptoms or concerns

## 2011-04-06 NOTE — Progress Notes (Signed)
Subjective:    Patient ID: Rickey Hurley, male    DOB: 28-Jul-1966, 45 y.o.   MRN: [id number]  HPI Here with acute onset mild to mod 2 wks ST, HA, general weakness and malaise, with prod cough greenish and rusty sputum despite mucinex, and antihist use, generalized anterior pleuritic bilat CP Left > right, but Pt denies other chest pain, orthopnea, PND, increased LE swelling, palpitations, dizziness or syncope, with mild onset wheezing, with mild sob/doe today.  Pt denies new neurological symptoms such as new headache, or facial or extremity weakness or numbness  Pt denies polydipsia, polyuria  Pt states overall good compliance with meds, trying to follow lower cholesterol diet, wt overall stable but little exercise however, and has gained signficant wt.  Also asks for refills of 2 creams for race for recurrent rash that were previously effective after derm evaluation.  Also mentions a chronic anal fistula problem with occasional stool accidents, that he blames on scarring related from his previous c diff colitis.   Past Medical History  Diagnosis Date  . CLOSTRIDIUM DIFFICILE COLITIS 04/19/2010  . HYPERLIPIDEMIA 12/21/2009  . GOUT 12/21/2009  . CONJUNCTIVITIS, ACUTE 04/11/2010  . HYPERTENSION 12/21/2009  . [facility] 12/21/2009  . COLONIC POLYPS, HX OF 12/21/2009  . Anal fistula 12/21/2009  . SINUSITIS- ACUTE-NOS 04/11/2010   Past Surgical History  Procedure Date  . Anal lateral inferior sphincterotomy from c diff   . Right ankle orif     reports that he has never smoked. He does not have any smokeless tobacco history on file. He reports that he drinks alcohol. He reports that he does not use illicit drugs. family history is not on file.  He is adopted. Allergies  Allergen Reactions  . Moxifloxacin     REACTION: c diff with subsequent bowel resection   Current Outpatient Prescriptions on File Prior to Visit  Medication Sig Dispense Refill  . allopurinol (ZYLOPRIM) 300 MG tablet Take 300 mg by mouth  daily.        Marland Kitchen aspirin 81 MG tablet Take 81 mg by mouth daily.        . fenofibrate 160 MG tablet Take 160 mg by mouth daily.        . metoprolol (TOPROL-XL) 50 MG 24 hr tablet Take 50 mg by mouth daily. Take 1/2 tab daily       . Multiple Vitamin (MULTIVITAMIN) tablet Take 1 tablet by mouth daily.        . Omega-3 Fatty Acids (FISH OIL) 1000 MG CAPS Take by mouth daily.        . simvastatin (ZOCOR) 40 MG tablet Take 40 mg by mouth at bedtime.        Marland Kitchen zolpidem (AMBIEN) 10 MG tablet Take 10 mg by mouth at bedtime as needed.          Review of SystemsAll otherwise neg per pt     Objective:   Physical Exam BP 100/68  Pulse 60  Temp(Src) 98.1 F (36.7 C) (Oral)  Ht 5\' 11"  (1.803 m)  Wt 252 lb 6 oz (114.477 kg)  BMI 35.20 kg/m2  SpO2 93% Physical Exam  VS noted obese, mild ill Constitutional: Pt appears well-developed and well-nourished.  HENT: Head: Normocephalic.  Right Ear: External ear normal.  Left Ear: External ear normal.  Bilat tm's mild erythema.  Sinus nontender.  Pharynx mild erythema Eyes: Conjunctivae and EOM are normal. Pupils are equal, round, and reactive to light.  Neck: Normal range of motion. Neck  supple.  Cardiovascular: Normal rate and regular rhythm.   Pulmonary/Chest: Effort normal and breath sounds decreased with mild wheezes Abd:  Soft, NT, non-distended, + BS Neurological: Pt is alert. No cranial nerve deficit.  Skin: Skin is warm. No erythema. except for mild erythema diffusely to nasal and cheek areas Psychiatric: Pt behavior is normal. Thought content normal. 1+ nervous       Assessment & Plan:

## 2011-04-06 NOTE — Assessment & Plan Note (Signed)
Chronic recurrent problem, for gen surgury at [facility] per pt reqeust

## 2011-04-06 NOTE — Assessment & Plan Note (Signed)
Mild, for cream tx as per emr;  to f/u any worsening symptoms or concerns Consider f/u with derm

## 2011-08-08 ENCOUNTER — Ambulatory Visit: Payer: BC Managed Care – PPO | Admitting: Internal Medicine

## 2011-08-21 ENCOUNTER — Other Ambulatory Visit: Payer: Self-pay | Admitting: Internal Medicine

## 2011-08-21 ENCOUNTER — Encounter: Payer: Self-pay | Admitting: Internal Medicine

## 2011-08-21 ENCOUNTER — Other Ambulatory Visit (INDEPENDENT_AMBULATORY_CARE_PROVIDER_SITE_OTHER): Payer: BC Managed Care – PPO

## 2011-08-21 ENCOUNTER — Ambulatory Visit (INDEPENDENT_AMBULATORY_CARE_PROVIDER_SITE_OTHER): Payer: BC Managed Care – PPO | Admitting: Internal Medicine

## 2011-08-21 ENCOUNTER — Telehealth: Payer: Self-pay

## 2011-08-21 VITALS — BP 104/72 | HR 67 | Temp 98.9°F | Ht 71.0 in | Wt 227.0 lb

## 2011-08-21 DIAGNOSIS — Z1289 Encounter for screening for malignant neoplasm of other sites: Secondary | ICD-10-CM

## 2011-08-21 DIAGNOSIS — R21 Rash and other nonspecific skin eruption: Secondary | ICD-10-CM

## 2011-08-21 DIAGNOSIS — Z0000 Encounter for general adult medical examination without abnormal findings: Secondary | ICD-10-CM

## 2011-08-21 DIAGNOSIS — Z23 Encounter for immunization: Secondary | ICD-10-CM

## 2011-08-21 DIAGNOSIS — M109 Gout, unspecified: Secondary | ICD-10-CM

## 2011-08-21 DIAGNOSIS — L989 Disorder of the skin and subcutaneous tissue, unspecified: Secondary | ICD-10-CM | POA: Insufficient documentation

## 2011-08-21 LAB — CBC WITH DIFFERENTIAL/PLATELET
Basophils Relative: 0.7 % (ref 0.0–3.0)
Eosinophils Relative: 2.4 % (ref 0.0–5.0)
HCT: 38.6 % — ABNORMAL LOW (ref 39.0–52.0)
Hemoglobin: 13.3 g/dL (ref 13.0–17.0)
Lymphs Abs: 1.5 10*3/uL (ref 0.7–4.0)
MCV: 90.8 fl (ref 78.0–100.0)
Monocytes Absolute: 0.3 10*3/uL (ref 0.1–1.0)
Neutro Abs: 2.3 10*3/uL (ref 1.4–7.7)
RBC: 4.25 Mil/uL (ref 4.22–5.81)
WBC: 4.3 10*3/uL — ABNORMAL LOW (ref 4.5–10.5)

## 2011-08-21 LAB — URINALYSIS, ROUTINE W REFLEX MICROSCOPIC
Leukocytes, UA: NEGATIVE
Specific Gravity, Urine: 1.025 (ref 1.000–1.030)
Urobilinogen, UA: 0.2 (ref 0.0–1.0)

## 2011-08-21 MED ORDER — SULFACETAMIDE SODIUM-SULFUR 10-5 % EX CREA: 1.0000 "application " | TOPICAL_CREAM | Freq: Two times a day (BID) | CUTANEOUS | Status: DC | PRN

## 2011-08-21 MED ORDER — KETOCONAZOLE 2 % EX CREA: TOPICAL_CREAM | Freq: Every day | CUTANEOUS | Status: DC

## 2011-08-21 MED ORDER — SIMVASTATIN 40 MG PO TABS: 40.0000 mg | ORAL_TABLET | Freq: Every day | ORAL | Status: DC

## 2011-08-21 MED ORDER — FENOFIBRATE 160 MG PO TABS: 160.0000 mg | ORAL_TABLET | Freq: Every day | ORAL | Status: DC

## 2011-08-21 MED ORDER — ZOLPIDEM TARTRATE 10 MG PO TABS: 10.0000 mg | ORAL_TABLET | Freq: Every evening | ORAL | Status: DC | PRN

## 2011-08-21 MED ORDER — ALLOPURINOL 300 MG PO TABS: 300.0000 mg | ORAL_TABLET | Freq: Every day | ORAL | Status: DC

## 2011-08-21 NOTE — Telephone Encounter (Signed)
Put order in for physical labs. 

## 2011-08-21 NOTE — Patient Instructions (Addendum)
You had the blood tests today and the results are pending (the uric acid was not ordered, but will be added) Please call the phone number [phone] (the [employer]Tree System) for results of testing in 2-3 days;  When calling, simply dial the number, and when prompted enter the MRN number above (the Medical Record Number) and the # key, then the message should start. You had the flu shot today Continue all other medications as before;  All of the refills were sent in You will be contacted regarding the referral for: dermatology Ok to stay off the toprol as you have done Please return in 1 year for your yearly visit, or sooner if needed, with Lab testing done 3-5 days before

## 2011-08-22 LAB — BASIC METABOLIC PANEL
BUN: 17 mg/dL (ref 6–23)
Calcium: 9 mg/dL (ref 8.4–10.5)
GFR: 132.06 mL/min (ref >60.00)
Glucose, Bld: 93 mg/dL (ref 70–99)

## 2011-08-22 LAB — TSH: TSH: 0.79 u[IU]/mL (ref 0.35–5.50)

## 2011-08-22 LAB — HEPATIC FUNCTION PANEL: Albumin: 4.3 g/dL (ref 3.5–5.2)

## 2011-08-22 LAB — LIPID PANEL: VLDL: 47.6 mg/dL — ABNORMAL HIGH (ref 0.0–40.0)

## 2011-08-22 LAB — PSA: PSA: 0.43 ng/mL (ref 0.10–4.00)

## 2011-08-23 ENCOUNTER — Encounter: Payer: Self-pay | Admitting: Internal Medicine

## 2011-08-23 NOTE — Assessment & Plan Note (Signed)
Overall doing well, age appropriate education and counseling updated, referrals for preventative services and immunizations addressed, dietary and smoking counseling addressed, most recent labs and ECG reviewed.  I have personally reviewed and have noted: 1) the patient's medical and social history 2) The pt's use of alcohol, tobacco, and illicit drugs 3) The patient's current medications and supplements 4) Functional ability including ADL's, fall risk, home safety risk, hearing and visual impairment 5) Diet and physical activities 6) Evidence for depression or mood disorder 7) The patient's height, weight, and BMI have been recorded in the chart I have made referrals, and provided counseling and education based on review of the above For flu shot

## 2011-08-23 NOTE — Progress Notes (Signed)
Subjective:    Patient ID: Rickey Hurley, male    DOB: 1966-04-01, 45 y.o.   MRN: [id number]  HPI  Here for wellness and f/u;  Overall doing ok;  Pt denies CP, worsening SOB, DOE, wheezing, orthopnea, PND, worsening LE edema, palpitations, dizziness or syncope.  Pt denies neurological change such as new Headache, facial or extremity weakness.  Pt denies polydipsia, polyuria, or low sugar symptoms. Pt states overall good compliance with treatment and medications, good tolerability, and trying to follow lower cholesterol diet.  Pt denies worsening depressive symptoms, suicidal ideation or panic. No fever, unintentional, night sweats, loss of appetite, or other constitutional symptoms.  Pt states good ability with ADL's, low fall risk, home safety reviewed and adequate, no significant changes in hearing or vision, and occasionally active with exercise.  Needs all med refills, flu shot. Did stop the toprol xl due to dizziness and lower BP with recent low carb diet and wt loss.  Needs derm referall for total body check with hx of mult moles removed in the past Wt down from from 263 to 227 in the past yr documented Past Medical History  Diagnosis Date  . CLOSTRIDIUM DIFFICILE COLITIS 04/19/2010  . HYPERLIPIDEMIA 12/21/2009  . GOUT 12/21/2009  . CONJUNCTIVITIS, ACUTE 04/11/2010  . HYPERTENSION 12/21/2009  . [facility] 12/21/2009  . COLONIC POLYPS, HX OF 12/21/2009  . Anal fistula 12/21/2009  . SINUSITIS- ACUTE-NOS 04/11/2010   Past Surgical History  Procedure Date  . Anal lateral inferior sphincterotomy from c diff   . Right ankle orif     reports that he has never smoked. He does not have any smokeless tobacco history on file. He reports that he drinks alcohol. He reports that he does not use illicit drugs. family history is not on file.  He is adopted. Allergies  Allergen Reactions  . Moxifloxacin     REACTION: c diff with subsequent bowel resection   Current Outpatient Prescriptions on File Prior to Visit    Medication Sig Dispense Refill  . aspirin 81 MG tablet Take 81 mg by mouth daily.        . Multiple Vitamin (MULTIVITAMIN) tablet Take 1 tablet by mouth daily.        . Omega-3 Fatty Acids (FISH OIL) 1000 MG CAPS Take by mouth daily.         Review of Systems Review of Systems  Constitutional: Negative for diaphoresis, activity change, appetite change and unexpected weight change.  HENT: Negative for hearing loss, ear pain, facial swelling, mouth sores and neck stiffness.   Eyes: Negative for pain, redness and visual disturbance.  Respiratory: Negative for shortness of breath and wheezing.   Cardiovascular: Negative for chest pain and palpitations.  Gastrointestinal: Negative for diarrhea, blood in stool, abdominal distention and rectal pain.  Genitourinary: Negative for hematuria, flank pain and decreased urine volume.  Musculoskeletal: Negative for myalgias and joint swelling.  Skin: Negative for color change and wound.  Neurological: Negative for syncope and numbness.  Hematological: Negative for adenopathy.  Psychiatric/Behavioral: Negative for hallucinations, self-injury, decreased concentration and agitation.     Objective:   Physical Exam BP 104/72  Pulse 67  Temp(Src) 98.9 F (37.2 C) (Oral)  Ht 5\' 11"  (1.803 m)  Wt 227 lb (102.967 kg)  BMI 31.66 kg/m2  SpO2 96% Physical Exam  VS noted Constitutional: Pt is oriented to person, place, and time. Appears well-developed and well-nourished.  HENT:  Head: Normocephalic and atraumatic.  Right Ear: External ear normal.  Left Ear: External ear normal.  Nose: Nose normal.  Mouth/Throat: Oropharynx is clear and moist.  Eyes: Conjunctivae and EOM are normal. Pupils are equal, round, and reactive to light.  Neck: Normal range of motion. Neck supple. No JVD present. No tracheal deviation present.  Cardiovascular: Normal rate, regular rhythm, normal heart sounds and intact distal pulses.   Pulmonary/Chest: Effort normal and breath  sounds normal.  Abdominal: Soft. Bowel sounds are normal. There is no tenderness.  Musculoskeletal: Normal range of motion. Exhibits no edema.  Lymphadenopathy:  Has no cervical adenopathy.  Neurological: Pt is alert and oriented to person, place, and time. Pt has normal reflexes. No cranial nerve deficit.  Skin: Skin is warm and dry. No rash noted. mult moles noted to back , none atypical appearing Psychiatric:  Has  normal mood and affect. Behavior is normal. 1+ nervous       Assessment & Plan:

## 2011-08-25 NOTE — Progress Notes (Signed)
Quick Note:  Voice message left on [employer] system - lab is negative, normal or otherwise stable, pt to continue same tx ______ 

## 2011-09-18 ENCOUNTER — Other Ambulatory Visit: Payer: Self-pay

## 2011-09-18 MED ORDER — FENOFIBRATE 160 MG PO TABS: 160.0000 mg | ORAL_TABLET | Freq: Every day | ORAL | Status: DC

## 2011-09-18 MED ORDER — SIMVASTATIN 40 MG PO TABS: 40.0000 mg | ORAL_TABLET | Freq: Every day | ORAL | Status: DC

## 2011-09-18 MED ORDER — ALLOPURINOL 300 MG PO TABS: 300.0000 mg | ORAL_TABLET | Freq: Every day | ORAL | Status: DC

## 2012-05-13 ENCOUNTER — Telehealth: Payer: Self-pay | Admitting: Internal Medicine

## 2012-05-14 ENCOUNTER — Other Ambulatory Visit (INDEPENDENT_AMBULATORY_CARE_PROVIDER_SITE_OTHER): Payer: BC Managed Care – PPO

## 2012-05-14 ENCOUNTER — Ambulatory Visit (INDEPENDENT_AMBULATORY_CARE_PROVIDER_SITE_OTHER)
Admission: RE | Admit: 2012-05-14 | Discharge: 2012-05-14 | Disposition: A | Discharge status: Home / Self Care | Payer: BC Managed Care – PPO | Source: Ambulatory Visit | Attending: Internal Medicine | Admitting: Internal Medicine

## 2012-05-14 ENCOUNTER — Ambulatory Visit (INDEPENDENT_AMBULATORY_CARE_PROVIDER_SITE_OTHER): Payer: BC Managed Care – PPO | Admitting: Internal Medicine

## 2012-05-14 ENCOUNTER — Encounter: Payer: Self-pay | Admitting: Internal Medicine

## 2012-05-14 VITALS — BP 104/70 | HR 59 | Temp 97.2°F | Ht 71.0 in | Wt 269.0 lb

## 2012-05-14 DIAGNOSIS — R002 Palpitations: Secondary | ICD-10-CM

## 2012-05-14 DIAGNOSIS — R079 Chest pain, unspecified: Secondary | ICD-10-CM

## 2012-05-14 DIAGNOSIS — R42 Dizziness and giddiness: Secondary | ICD-10-CM

## 2012-05-14 DIAGNOSIS — I10 Essential (primary) hypertension: Secondary | ICD-10-CM

## 2012-05-14 LAB — BASIC METABOLIC PANEL
BUN: 16 mg/dL (ref 6–23)
Chloride: 106 mEq/L (ref 96–112)
Potassium: 4.1 mEq/L (ref 3.5–5.1)

## 2012-05-14 LAB — CBC WITH DIFFERENTIAL/PLATELET
Basophils Relative: 0.8 % (ref 0.0–3.0)
Eosinophils Absolute: 0.1 10*3/uL (ref 0.0–0.7)
MCHC: 34.4 g/dL (ref 30.0–36.0)
MCV: 89.9 fl (ref 78.0–100.0)
Monocytes Absolute: 0.6 10*3/uL (ref 0.1–1.0)
Neutrophils Relative %: 53.6 % (ref 43.0–77.0)
Platelets: 242 10*3/uL (ref 150.0–400.0)

## 2012-05-14 LAB — HEPATIC FUNCTION PANEL
Bilirubin, Direct: 0.1 mg/dL (ref 0.0–0.3)
Total Protein: 6.9 g/dL (ref 6.0–8.3)

## 2012-05-14 LAB — TSH: TSH: 1.31 u[IU]/mL (ref 0.35–5.50)

## 2012-05-14 NOTE — Patient Instructions (Addendum)
Your EKG was OK today Please go to XRAY in the Basement for the x-ray test Please go to LAB in the Basement for the blood and/or urine tests to be done today You will be contacted by phone if any changes need to be made immediately.  Otherwise, you will receive a letter about your results with an explanation. You will be contacted regarding the referral for: Holter monitor We can consider an Echocardiogram and/or Cardiology referral if symptoms persist

## 2012-05-14 NOTE — Assessment & Plan Note (Addendum)
ECG reviewed as per emr, not orthostatic today, also for urine tests r/o pheo,  to f/u any worsening symptoms or concerns

## 2012-05-15 ENCOUNTER — Encounter: Payer: Self-pay | Admitting: Internal Medicine

## 2012-05-15 NOTE — Progress Notes (Signed)
Subjective:    Patient ID: Rickey Hurley, male    DOB: Jan 02, 1966, 46 y.o.   MRN: [id number]  HPI here with c/o onset increased now daily recurrent palp's described as skipped beat followed by hard beat for 6 wks occuring mult times per day and today with a 6 beat episode in a row per pt;  Has had dizziness intermittent as well for 2 wks, not clear if associated, and more sob in the past wk.  Denies worsening depressive symptoms, suicidal ideation, or panic, though has ongoing anxiety.  Denies worsening stressors.  Has 3 cups coffee per day, maybe 4 soda per wk.  Has long hx of APC on toprol for yrs.  No fever, sweats, and  Pt denies fever, wt loss, night sweats, loss of appetite, or other constitutional symptoms  Pt denies , wheezing, orthopnea, PND, increased LE swelling, or syncope but has had a kind of left  chest heaviness off and on with a mild new nonprod cough.   Past Medical History  Diagnosis Date  . CLOSTRIDIUM DIFFICILE COLITIS 04/19/2010  . HYPERLIPIDEMIA 12/21/2009  . GOUT 12/21/2009  . CONJUNCTIVITIS, ACUTE 04/11/2010  . HYPERTENSION 12/21/2009  . [facility] 12/21/2009  . COLONIC POLYPS, HX OF 12/21/2009  . Anal fistula 12/21/2009  . SINUSITIS- ACUTE-NOS 04/11/2010   Past Surgical History  Procedure Date  . Anal lateral inferior sphincterotomy from c diff   . Right ankle orif     reports that he has never smoked. He does not have any smokeless tobacco history on file. He reports that he drinks alcohol. He reports that he does not use illicit drugs. family history is not on file.  He is adopted. Allergies  Allergen Reactions  . Moxifloxacin     REACTION: c diff with subsequent bowel resection   Current Outpatient Prescriptions on File Prior to Visit  Medication Sig Dispense Refill  . allopurinol (ZYLOPRIM) 300 MG tablet Take 1 tablet (300 mg total) by mouth daily.  90 tablet  3  . aspirin 81 MG tablet Take 81 mg by mouth daily.        . fenofibrate 160 MG tablet Take 1 tablet (160 mg  total) by mouth daily.  90 tablet  3  . ketoconazole (NIZORAL) 2 % cream Apply topically daily.  75 g  2  . Multiple Vitamin (MULTIVITAMIN) tablet Take 1 tablet by mouth daily.        . Omega-3 Fatty Acids (FISH OIL) 1000 MG CAPS Take by mouth daily.        . simvastatin (ZOCOR) 40 MG tablet Take 1 tablet (40 mg total) by mouth at bedtime.  90 tablet  3  . Sulfacetamide Sodium-Sulfur 10-5 % CREA Apply 1 application topically 2 (two) times daily as needed.  120 g  1  . zolpidem (AMBIEN) 10 MG tablet Take 1 tablet (10 mg total) by mouth at bedtime as needed.  90 tablet  1   Review of Systems Review of Systems  Constitutional: Negative for diaphoresis and unexpected weight change.  HENT: Negative for drooling and tinnitus.   Eyes: Negative for photophobia and visual disturbance.  Respiratory: Negative for choking and stridor.   Gastrointestinal: Negative for vomiting and blood in stool.  Genitourinary: Negative for hematuria and decreased urine volume.  Musculoskeletal: Negative for gait problem.  Skin: Negative for color change and wound.  Neurological: Negative for tremors and numbness.  Psychiatric/Behavioral: Negative for decreased concentration. The patient is not hyperactive.  Objective:   Physical Exam BP 104/70  Pulse 59  Temp 97.2 F (36.2 C) (Oral)  Ht 5\' 11"  (1.803 m)  Wt 269 lb (122.018 kg)  BMI 37.52 kg/m2  SpO2 95% Physical Exam  VS noted Constitutional: Pt appears well-developed and well-nourished.  HENT: Head: Normocephalic.  Right Ear: External ear normal.  Left Ear: External ear normal.  Eyes: Conjunctivae and EOM are normal. Pupils are equal, round, and reactive to light.  Neck: Normal range of motion. Neck supple.  Cardiovascular: Normal rate and regular rhythm.   Pulmonary/Chest: Effort normal and breath sounds normal.  Abd:  Soft, NT, non-distended, + BS Neurological: Pt is alert. Not confused, motor.gait intact Skin: Skin is warm. No erythema.    Psychiatric: Pt behavior is normal. Thought content normal. 1+ nervous    Assessment & Plan:

## 2012-05-15 NOTE — Assessment & Plan Note (Signed)
ECG reviewed as per emr, suspect increased apc's, to cont toprol for now due to current bradycardia, for holter to confirm, consider cardiology,

## 2012-05-15 NOTE — Assessment & Plan Note (Signed)
Unclear etiology, doubt cardiac, GI, [facility] - for CXR to r/o pulm, doubt PE as well

## 2012-05-15 NOTE — Assessment & Plan Note (Signed)
stable overall by hx and exam, most recent data reviewed with pt, and pt to continue medical treatment as before BP Readings from Last 3 Encounters:  05/14/12 104/70  08/21/11 104/72  04/04/11 100/68

## 2012-05-18 ENCOUNTER — Other Ambulatory Visit: Payer: BC Managed Care – PPO

## 2012-05-18 DIAGNOSIS — R002 Palpitations: Secondary | ICD-10-CM

## 2012-05-21 LAB — CATECHOLAMINES, FRACTIONATED, URINE, 24 HOUR
Calculated Total (E+NE): 72 mcg/24 h (ref 26–121)
Total Volume - CF 24Hr U: 2600 mL

## 2012-05-22 LAB — METANEPHRINES, URINE, 24 HOUR
Metaneph Total, Ur: 596 mcg/24 h (ref 182–739)
Metanephrines, Ur: 103 mcg/24 h (ref 58–203)
Normetanephrine, 24H Ur: 493 mcg/24 h (ref 88–649)

## 2012-05-24 ENCOUNTER — Encounter (INDEPENDENT_AMBULATORY_CARE_PROVIDER_SITE_OTHER): Payer: BC Managed Care – PPO

## 2012-05-24 ENCOUNTER — Encounter: Payer: Self-pay | Admitting: Internal Medicine

## 2012-05-24 DIAGNOSIS — R002 Palpitations: Secondary | ICD-10-CM

## 2012-05-31 ENCOUNTER — Encounter: Payer: Self-pay | Admitting: Internal Medicine

## 2012-05-31 ENCOUNTER — Telehealth: Payer: Self-pay | Admitting: Internal Medicine

## 2012-05-31 DIAGNOSIS — R002 Palpitations: Secondary | ICD-10-CM

## 2012-05-31 DIAGNOSIS — I471 Supraventricular tachycardia: Secondary | ICD-10-CM

## 2012-05-31 NOTE — Telephone Encounter (Signed)
Message copied by Corwin Levins on Mon May 31, 2012  6:11 PM ------      Message from: Scharlene Gloss B      Created: Mon May 31, 2012  4:49 PM       Called the patient and he has not ever seen a cardiologist. Corinda [facility] Card. Is fine with him

## 2012-05-31 NOTE — Telephone Encounter (Signed)
Ok for referral - done 

## 2012-06-03 ENCOUNTER — Ambulatory Visit (INDEPENDENT_AMBULATORY_CARE_PROVIDER_SITE_OTHER): Payer: BC Managed Care – PPO | Admitting: Cardiology

## 2012-06-03 ENCOUNTER — Encounter: Payer: Self-pay | Admitting: Cardiology

## 2012-06-03 VITALS — BP 122/78 | HR 48 | Ht 71.0 in | Wt 273.0 lb

## 2012-06-03 DIAGNOSIS — I491 Atrial premature depolarization: Secondary | ICD-10-CM

## 2012-06-03 DIAGNOSIS — I4949 Other premature depolarization: Secondary | ICD-10-CM

## 2012-06-03 DIAGNOSIS — R079 Chest pain, unspecified: Secondary | ICD-10-CM

## 2012-06-03 DIAGNOSIS — I493 Ventricular premature depolarization: Secondary | ICD-10-CM

## 2012-06-03 NOTE — Patient Instructions (Signed)
We will schedule you for a stress Echo.  Continue your current dose of Toprol

## 2012-06-03 NOTE — Assessment & Plan Note (Signed)
I suspect his palpitations are related to his PACs but his Holter monitor was really fairly benign. His PACs are infrequent. He is well beta blocked on metoprolol. I don't feel that further titration is going to help him. I have recommended avoidance of caffeine. I think that regular aerobic exercise would be beneficial. He does not smoke or drink alcohol excessively. He does not use decongestants.

## 2012-06-03 NOTE — Progress Notes (Signed)
Rickey Hurley Date of Birth: 09-15-1966 Medical Record #[id number]  History of Present Illness: Rickey Hurley is seen today at the request of Dr. Jonny Ruiz for evaluation of palpitations, shortness of breath, and chest discomfort. 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 He reports a long history of PACs and has been on metoprolol for many years. Over the past 5 weeks he has noted increased palpitations. These may awaken him at night. He thinks they are more frequent and may be more severe. His palpitations are described as a skipped beat followed by a forceful beat. He rarely has any tachycardia. In addition of the past 2 weeks he has complained of symptoms of shortness of breath and tightness in his chest. He has a decrease in energy level. Usually he is very active and walk 30-40 minutes a day. He has found this very difficult recently. He has experienced some lightheadedness. He does report a history of viral pericarditis 11 years ago. He thinks he had a stress test 5 years ago that was normal.  Current Outpatient Prescriptions on File Prior to Visit  Medication Sig Dispense Refill  . allopurinol (ZYLOPRIM) 300 MG tablet Take 1 tablet (300 mg total) by mouth daily.  90 tablet  3  . aspirin 81 MG tablet Take 81 mg by mouth daily.        . fenofibrate 160 MG tablet Take 1 tablet (160 mg total) by mouth daily.  90 tablet  3  . ketoconazole (NIZORAL) 2 % cream Apply topically daily.  75 g  2  . lisinopril (PRINIVIL,ZESTRIL) 10 MG tablet Take 10 mg by mouth daily.      . metoprolol tartrate (LOPRESSOR) 25 MG tablet Take 25 mg by mouth 2 (two) times daily.       . Multiple Vitamin (MULTIVITAMIN) tablet Take 1 tablet by mouth daily.        . Omega-3 Fatty Acids (FISH OIL) 1000 MG CAPS Take by mouth daily.        . simvastatin (ZOCOR) 40 MG tablet Take 1 tablet (40 mg total) by mouth at bedtime.  90 tablet  3  . Sulfacetamide Sodium-Sulfur 10-5 % CREA Apply 1 application topically  2 (two) times daily as needed.  120 g  1  . zolpidem (AMBIEN) 10 MG tablet Take 1 tablet (10 mg total) by mouth at bedtime as needed.  90 tablet  1    Allergies  Allergen Reactions  . Moxifloxacin     REACTION: c diff with subsequent bowel resection    Past Medical History  Diagnosis Date  . CLOSTRIDIUM DIFFICILE COLITIS 04/19/2010  . HYPERLIPIDEMIA 12/21/2009  . GOUT 12/21/2009  . CONJUNCTIVITIS, ACUTE 04/11/2010  . HYPERTENSION 12/21/2009  . [facility] 12/21/2009  . COLONIC POLYPS, HX OF 12/21/2009  . Anal fistula 12/21/2009  . SINUSITIS- ACUTE-NOS 04/11/2010  . H/O viral pericarditis     Past Surgical History  Procedure Date  . Anal lateral inferior sphincterotomy from c diff   . Right ankle orif     History  Smoking status  . Never Smoker   Smokeless tobacco  . Not on file    History  Alcohol Use  . Yes    Family History  Problem Relation Age of Onset  . Adopted: Yes    Review of Systems: As noted in history of present illness.  All other systems were reviewed and are negative.  Physical Exam: BP 122/78  Pulse 48  Ht  5\' 11"  (1.803 m)  Wt 273 lb (123.832 kg)  BMI 38.08 kg/m2  SpO2 98% He is an obese white male in no acute distress.The patient is alert and oriented x 3.  The mood and affect are normal.  The skin is warm and dry.  Color is normal.  The HEENT exam reveals that the sclera are nonicteric.  The mucous membranes are moist.  The carotids are 2+ without bruits.  There is no thyromegaly.  There is no JVD.  The lungs are clear.  The chest wall is non tender.  The heart exam reveals a regular rate with a normal S1 and S2.  There are no murmurs, gallops, or rubs.  The PMI is not displaced.   Abdominal exam reveals good bowel sounds.  There is no guarding or rebound.  There is no hepatosplenomegaly or tenderness.  There are no masses.  Exam of the legs reveal no clubbing, cyanosis, or edema.  The legs are without rashes.  The distal pulses are intact.  Cranial nerves II  - XII are intact.  Motor and sensory functions are intact.  The gait is normal.  LABORATORY DATA: ECG demonstrates normal sinus rhythm with a normal ECG. Recent lab work including chemistries, CBC, and TSH were all normal. Urine catecholamine levels were normal. Chest x-ray was normal. 24-hour Holter monitor demonstrated rare PACs and PVCs. He had a total of 19 PVCs that were unifocal. He had a total of 24 PACs with one PAC triplet.  Assessment / Plan:

## 2012-06-03 NOTE — Assessment & Plan Note (Signed)
He has symptoms of chest pain and dyspnea on exertion. We will schedule her for a stress echo. This will allow [place] to rule out significant structural heart disease or ischemia. If normal I think we can reassure him and I would encourage him to resume his exercise program.

## 2012-06-11 ENCOUNTER — Encounter: Payer: Self-pay | Admitting: Cardiology

## 2012-06-11 ENCOUNTER — Ambulatory Visit (HOSPITAL_COMMUNITY): Payer: BC Managed Care – PPO | Attending: Cardiovascular Disease | Admitting: Radiology

## 2012-06-11 ENCOUNTER — Ambulatory Visit (HOSPITAL_BASED_OUTPATIENT_CLINIC_OR_DEPARTMENT_OTHER): Payer: BC Managed Care – PPO

## 2012-06-11 DIAGNOSIS — I4949 Other premature depolarization: Secondary | ICD-10-CM | POA: Insufficient documentation

## 2012-06-11 DIAGNOSIS — R079 Chest pain, unspecified: Secondary | ICD-10-CM | POA: Insufficient documentation

## 2012-06-11 DIAGNOSIS — R0989 Other specified symptoms and signs involving the circulatory and respiratory systems: Secondary | ICD-10-CM

## 2012-06-11 DIAGNOSIS — E785 Hyperlipidemia, unspecified: Secondary | ICD-10-CM | POA: Insufficient documentation

## 2012-06-11 DIAGNOSIS — R42 Dizziness and giddiness: Secondary | ICD-10-CM | POA: Insufficient documentation

## 2012-06-11 DIAGNOSIS — R0609 Other forms of dyspnea: Secondary | ICD-10-CM | POA: Insufficient documentation

## 2012-06-11 DIAGNOSIS — I491 Atrial premature depolarization: Secondary | ICD-10-CM

## 2012-06-11 DIAGNOSIS — R002 Palpitations: Secondary | ICD-10-CM | POA: Insufficient documentation

## 2012-06-11 DIAGNOSIS — I10 Essential (primary) hypertension: Secondary | ICD-10-CM | POA: Insufficient documentation

## 2012-06-11 DIAGNOSIS — I493 Ventricular premature depolarization: Secondary | ICD-10-CM

## 2012-06-11 DIAGNOSIS — R072 Precordial pain: Secondary | ICD-10-CM | POA: Insufficient documentation

## 2012-06-11 DIAGNOSIS — Z8679 Personal history of other diseases of the circulatory system: Secondary | ICD-10-CM | POA: Insufficient documentation

## 2012-06-11 NOTE — Progress Notes (Signed)
Echocardiogram performed.  

## 2012-06-27 ENCOUNTER — Encounter: Payer: Self-pay | Admitting: Internal Medicine

## 2012-06-28 ENCOUNTER — Telehealth: Payer: Self-pay | Admitting: Internal Medicine

## 2012-06-28 NOTE — Telephone Encounter (Signed)
Message copied by Etheleen Sia on Mon Jun 28, 2012 10:21 AM ------      Message from: Etta Grandchild      Created: Sun Jun 27, 2012  5:06 PM      Regarding: RE: SWITCH PCP       yes      ----- Message -----         From: Etheleen Sia         Sent: 06/24/2012  11:07 AM           To: Corwin Levins, MD, Etheleen Sia, #      Subject: [facility] PCP                                               Caralyn Guile would like to switch PCP from Dr. Jonny Ruiz to Dr. Yetta Barre.      Will this be ok?      Pt's phone [phone]

## 2012-06-28 NOTE — Telephone Encounter (Signed)
Message copied by Etheleen Sia on Mon Jun 28, 2012 10:20 AM ------      Message from: Corwin Levins      Created: Thu Jun 24, 2012  7:36 PM      Regarding: RE: SWITCH PCP       Ok with me      ----- Message -----         From: Etheleen Sia         Sent: 06/24/2012  11:07 AM           To: Corwin Levins, MD, Etheleen Sia, #      Subject: [facility] PCP                                               Rickey Hurley would like to switch PCP from Dr. Jonny Ruiz to Dr. Yetta Barre.      Will this be ok?      Pt's phone [phone]

## 2012-07-21 ENCOUNTER — Encounter: Payer: Self-pay | Admitting: Internal Medicine

## 2012-07-21 ENCOUNTER — Ambulatory Visit (INDEPENDENT_AMBULATORY_CARE_PROVIDER_SITE_OTHER): Payer: BC Managed Care – PPO | Admitting: Internal Medicine

## 2012-07-21 VITALS — BP 120/80 | HR 58 | Temp 98.3°F | Resp 14 | Ht 72.0 in | Wt 277.5 lb

## 2012-07-21 DIAGNOSIS — M109 Gout, unspecified: Secondary | ICD-10-CM

## 2012-07-21 DIAGNOSIS — I10 Essential (primary) hypertension: Secondary | ICD-10-CM

## 2012-07-21 DIAGNOSIS — I491 Atrial premature depolarization: Secondary | ICD-10-CM

## 2012-07-21 DIAGNOSIS — E785 Hyperlipidemia, unspecified: Secondary | ICD-10-CM

## 2012-07-21 MED ORDER — ZOLPIDEM TARTRATE 10 MG PO TABS: 10.0000 mg | ORAL_TABLET | Freq: Every evening | ORAL | Status: DC | PRN

## 2012-07-21 MED ORDER — METOPROLOL TARTRATE 25 MG PO TABS: 25.0000 mg | ORAL_TABLET | Freq: Two times a day (BID) | ORAL | Status: DC

## 2012-07-21 MED ORDER — FENOFIBRATE 160 MG PO TABS: 160.0000 mg | ORAL_TABLET | Freq: Every day | ORAL | Status: DC

## 2012-07-21 MED ORDER — ALLOPURINOL 300 MG PO TABS: 300.0000 mg | ORAL_TABLET | Freq: Every day | ORAL | Status: DC

## 2012-07-21 MED ORDER — SIMVASTATIN 40 MG PO TABS: 40.0000 mg | ORAL_TABLET | Freq: Every day | ORAL | Status: DC

## 2012-07-21 MED ORDER — LISINOPRIL 10 MG PO TABS: 10.0000 mg | ORAL_TABLET | Freq: Every day | ORAL | Status: DC

## 2012-07-21 NOTE — Assessment & Plan Note (Signed)
No palpitations, continue  metoprolol

## 2012-07-21 NOTE — Assessment & Plan Note (Signed)
His BP is well controlled, I will check his lytes and renal function 

## 2012-07-21 NOTE — Assessment & Plan Note (Signed)
I will check his uric acid level and renal function today

## 2012-07-21 NOTE — Progress Notes (Signed)
  Subjective:    Patient ID: Rickey Hurley, male    DOB: [birth year], 46 y.o.   MRN: [id number]  Hyperlipidemia This is a chronic problem. The current episode started more than 1 year ago. The problem is controlled. Recent lipid tests were reviewed and are variable. Exacerbating diseases include obesity. He has no history of chronic renal disease, diabetes, liver disease or nephrotic syndrome. Factors aggravating his hyperlipidemia include fatty foods. Pertinent negatives include no chest pain, focal sensory loss, focal weakness, leg pain, myalgias or shortness of breath. Current antihyperlipidemic treatment includes statins and fibric acid derivatives. The current treatment provides moderate improvement of lipids. Compliance problems include adherence to exercise and adherence to diet.  Risk factors for coronary artery disease include hypertension.      Review of Systems  Constitutional: Positive for unexpected weight change (some weight gain). Negative for fever, chills, diaphoresis, activity change, appetite change and fatigue.  HENT: Negative.   Eyes: Negative.   Respiratory: Negative for cough, chest tightness, shortness of breath, wheezing and stridor.   Cardiovascular: Negative for chest pain, palpitations and leg swelling.  Gastrointestinal: Negative for nausea, vomiting, abdominal pain, diarrhea and blood in stool.  Genitourinary: Negative.   Musculoskeletal: Negative for myalgias, joint swelling, arthralgias and gait problem.  Skin: Negative.   Neurological: Negative.  Negative for focal weakness.  Hematological: Negative for adenopathy. Does not bruise/bleed easily.  Psychiatric/Behavioral: Negative.        Objective:   Physical Exam  Vitals reviewed. Constitutional: He is oriented to person, place, and time. He appears well-developed and well-nourished. No distress.  HENT:  Head: Normocephalic and atraumatic.  Mouth/Throat: Oropharynx is clear and moist. No oropharyngeal  exudate.  Eyes: Conjunctivae are normal. Right eye exhibits no discharge. Left eye exhibits no discharge. No scleral icterus.  Neck: Normal range of motion. Neck supple. No JVD present. No tracheal deviation present. No thyromegaly present.  Cardiovascular: Normal rate, regular rhythm, normal heart sounds and intact distal pulses.  Exam reveals no gallop and no friction rub.   No murmur heard. Pulmonary/Chest: Effort normal and breath sounds normal. No stridor. No respiratory distress. He has no wheezes. He has no rales. He exhibits no tenderness.  Abdominal: Soft. Bowel sounds are normal. He exhibits no distension and no mass. There is no tenderness. There is no rebound and no guarding.  Musculoskeletal: Normal range of motion. He exhibits no edema and no tenderness.  Lymphadenopathy:    He has no cervical adenopathy.  Neurological: He is oriented to person, place, and time.  Skin: Skin is warm and dry. No rash noted. He is not diaphoretic. No erythema. No pallor.  Psychiatric: He has a normal mood and affect. His behavior is normal. Judgment and thought content normal.      Lab Results  Component Value Date   WBC 6.2 05/14/2012   HGB 13.2 05/14/2012   HCT 38.4* 05/14/2012   PLT 242.0 05/14/2012   GLUCOSE 80 05/14/2012   CHOL 177 08/21/2011   TRIG 238.0* 08/21/2011   HDL 34.70* 08/21/2011   LDLDIRECT 100.8 08/21/2011   LDLCALC 81 01/23/2010   ALT 45 05/14/2012   AST 31 05/14/2012   NA 141 05/14/2012   K 4.1 05/14/2012   CL 106 05/14/2012   CREATININE 0.9 05/14/2012   BUN 16 05/14/2012   CO2 26 05/14/2012   TSH 1.31 05/14/2012   PSA 0.43 08/21/2011      Assessment & Plan:

## 2012-07-21 NOTE — Patient Instructions (Signed)
Hypertriglyceridemia  Diet for High blood levels of Triglycerides Most fats in food are triglycerides. Triglycerides in your blood are stored as fat in your body. High levels of triglycerides in your blood may put you at a greater risk for heart disease and stroke.  Normal triglyceride levels are less than 150 mg/dL. Borderline high levels are 150-199 mg/dl. High levels are 200 - 499 mg/dL, and very high triglyceride levels are greater than 500 mg/dL. The decision to treat high triglycerides is generally based on the level. For people with borderline or high triglyceride levels, treatment includes weight loss and exercise. Drugs are recommended for people with very high triglyceride levels. Many people who need treatment for high triglyceride levels have metabolic syndrome. This syndrome is a collection of disorders that often include: insulin resistance, high blood pressure, blood clotting problems, high cholesterol and triglycerides. TESTING PROCEDURE FOR TRIGLYCERIDES  You should not eat 4 hours before getting your triglycerides measured. The normal range of triglycerides is between 10 and 250 milligrams per deciliter (mg/dl). Some people may have extreme levels (1000 or above), but your triglyceride level may be too high if it is above 150 mg/dl, depending on what other risk factors you have for heart disease.   People with high blood triglycerides may also have high blood cholesterol levels. If you have high blood cholesterol as well as high blood triglycerides, your risk for heart disease is probably greater than if you only had high triglycerides. High blood cholesterol is one of the main risk factors for heart disease.  CHANGING YOUR DIET  Your weight can affect your blood triglyceride level. If you are more than 20% above your ideal body weight, you may be able to lower your blood triglycerides by losing weight. Eating less and exercising regularly is the best way to combat this. Fat provides  more calories than any other food. The best way to lose weight is to eat less fat. Only 30% of your total calories should come from fat. Less than 7% of your diet should come from saturated fat. A diet low in fat and saturated fat is the same as a diet to decrease blood cholesterol. By eating a diet lower in fat, you may lose weight, lower your blood cholesterol, and lower your blood triglyceride level.  Eating a diet low in fat, especially saturated fat, may also help you lower your blood triglyceride level. Ask your dietitian to help you figure how much fat you can eat based on the number of calories your caregiver has prescribed for you.  Exercise, in addition to helping with weight loss may also help lower triglyceride levels.   Alcohol can increase blood triglycerides. You may need to stop drinking alcoholic beverages.   Too much carbohydrate in your diet may also increase your blood triglycerides. Some complex carbohydrates are necessary in your diet. These may include bread, rice, potatoes, other starchy vegetables and cereals.   Reduce "simple" carbohydrates. These may include pure sugars, candy, honey, and jelly without losing other nutrients. If you have the kind of high blood triglycerides that is affected by the amount of carbohydrates in your diet, you will need to eat less sugar and less high-sugar foods. Your caregiver can help you with this.   Adding 2-4 grams of fish oil (EPA+ DHA) may also help lower triglycerides. Speak with your caregiver before adding any supplements to your regimen.  Following the Diet  Maintain your ideal weight. Your caregivers can help you with a diet. Generally,   eating less food and getting more exercise will help you lose weight. Joining a weight control group may also help. Ask your caregivers for a good weight control group in your area.  Eat low-fat foods instead of high-fat foods. This can help you lose weight too.  These foods are lower in fat. Eat MORE  of these:   Dried beans, peas, and lentils.   Egg whites.   Low-fat cottage cheese.   Fish.   Lean cuts of meat, such as round, sirloin, rump, and flank (cut extra fat off meat you fix).   Whole grain breads, cereals and pasta.   Skim and nonfat dry milk.   Low-fat yogurt.   Poultry without the skin.   Cheese made with skim or part-skim milk, such as mozzarella, parmesan, farmers', ricotta, or pot cheese.  These are higher fat foods. Eat LESS of these:   Whole milk and foods made from whole milk, such as American, blue, cheddar, monterey jack, and swiss cheese   High-fat meats, such as luncheon meats, sausages, knockwurst, bratwurst, hot dogs, ribs, corned beef, ground pork, and regular ground beef.   Fried foods.  Limit saturated fats in your diet. Substituting unsaturated fat for saturated fat may decrease your blood triglyceride level. You will need to read package labels to know which products contain saturated fats.  These foods are high in saturated fat. Eat LESS of these:   Fried pork skins.   Whole milk.   Skin and fat from poultry.   Palm oil.   Butter.   Shortening.   Cream cheese.   Bacon.   Margarines and baked goods made from listed oils.   Vegetable shortenings.   Chitterlings.   Fat from meats.   Coconut oil.   Palm kernel oil.   Lard.   Cream.   Sour cream.   Fatback.   Coffee whiteners and non-dairy creamers made with these oils.   Cheese made from whole milk.  Use unsaturated fats (both polyunsaturated and monounsaturated) moderately. Remember, even though unsaturated fats are better than saturated fats; you still want a diet low in total fat.  These foods are high in unsaturated fat:   Canola oil.   Sunflower oil.   Mayonnaise.   Almonds.   Peanuts.   Pine nuts.   Margarines made with these oils.   Safflower oil.   Olive oil.   Avocados.   Cashews.   Peanut butter.   Sunflower seeds.   Soybean oil.     Peanut oil.   Olives.   Pecans.   Walnuts.   Pumpkin seeds.  Avoid sugar and other high-sugar foods. This will decrease carbohydrates without decreasing other nutrients. Sugar in your food goes rapidly to your blood. When there is excess sugar in your blood, your liver may use it to make more triglycerides. Sugar also contains calories without other important nutrients.  Eat LESS of these:   Sugar, brown sugar, powdered sugar, jam, jelly, preserves, honey, syrup, molasses, pies, candy, cakes, cookies, frosting, pastries, colas, soft drinks, punches, fruit drinks, and regular gelatin.   Avoid alcohol. Alcohol, even more than sugar, may increase blood triglycerides. In addition, alcohol is high in calories and low in nutrients. Ask for sparkling water, or a diet soft drink instead of an alcoholic beverage.  Suggestions for planning and preparing meals   Bake, broil, grill or roast meats instead of frying.   Remove fat from meats and skin from poultry before cooking.   Add spices,   herbs, lemon juice or vinegar to vegetables instead of salt, rich sauces or gravies.   Use a non-stick skillet without fat or use no-stick sprays.   Cool and refrigerate stews and broth. Then remove the hardened fat floating on the surface before serving.   Refrigerate meat drippings and skim off fat to make low-fat gravies.   Serve more fish.   Use less butter, margarine and other high-fat spreads on bread or vegetables.   Use skim or reconstituted non-fat dry milk for cooking.   Cook with low-fat cheeses.   Substitute low-fat yogurt or cottage cheese for all or part of the sour cream in recipes for sauces, dips or congealed salads.   Use half yogurt/half mayonnaise in salad recipes.   Substitute evaporated skim milk for cream. Evaporated skim milk or reconstituted non-fat dry milk can be whipped and substituted for whipped cream in certain recipes.   Choose fresh fruits for dessert instead of  high-fat foods such as pies or cakes. Fruits are naturally low in fat.  When Dining Out   Order low-fat appetizers such as fruit or vegetable juice, pasta with vegetables or tomato sauce.   Select clear, rather than cream soups.   Ask that dressings and gravies be served on the side. Then use less of them.   Order foods that are baked, broiled, poached, steamed, stir-fried, or roasted.   Ask for margarine instead of butter, and use only a small amount.   Drink sparkling water, unsweetened tea or coffee, or diet soft drinks instead of alcohol or other sweet beverages.  QUESTIONS AND ANSWERS ABOUT OTHER FATS IN THE BLOOD: SATURATED FAT, TRANS FAT, AND CHOLESTEROL What is trans fat? Trans fat is a type of fat that is formed when vegetable oil is hardened through a process called hydrogenation. This process helps makes foods more solid, gives them shape, and prolongs their shelf life. Trans fats are also called hydrogenated or partially hydrogenated oils.  What do saturated fat, trans fat, and cholesterol in foods have to do with heart disease? Saturated fat, trans fat, and cholesterol in the diet all raise the level of LDL "bad" cholesterol in the blood. The higher the LDL cholesterol, the greater the risk for coronary heart disease (CHD). Saturated fat and trans fat raise LDL similarly.  What foods contain saturated fat, trans fat, and cholesterol? High amounts of saturated fat are found in animal products, such as fatty cuts of meat, chicken skin, and full-fat dairy products like butter, whole milk, cream, and cheese, and in tropical vegetable oils such as palm, palm kernel, and coconut oil. Trans fat is found in some of the same foods as saturated fat, such as vegetable shortening, some margarines (especially hard or stick margarine), crackers, cookies, baked goods, fried foods, salad dressings, and other processed foods made with partially hydrogenated vegetable oils. Small amounts of trans fat  also occur naturally in some animal products, such as milk products, beef, and lamb. Foods high in cholesterol include liver, other organ meats, egg yolks, shrimp, and full-fat dairy products. How can I use the new food label to make heart-healthy food choices? Check the Nutrition Facts panel of the food label. Choose foods lower in saturated fat, trans fat, and cholesterol. For saturated fat and cholesterol, you can also use the Percent Daily Value (%DV): 5% DV or less is low, and 20% DV or more is high. (There is no %DV for trans fat.) Use the Nutrition Facts panel to choose foods low in   saturated fat and cholesterol, and if the trans fat is not listed, read the ingredients and limit products that list shortening or hydrogenated or partially hydrogenated vegetable oil, which tend to be high in trans fat. POINTS TO REMEMBER: YOU NEED A LITTLE TLC (THERAPEUTIC LIFESTYLE CHANGES)  Discuss your risk for heart disease with your caregivers, and take steps to reduce risk factors.   Change your diet. Choose foods that are low in saturated fat, trans fat, and cholesterol.   Add exercise to your daily routine if it is not already being done. Participate in physical activity of moderate intensity, like brisk walking, for at least 30 minutes on most, and preferably all days of the week. No time? Break the 30 minutes into three, 10-minute segments during the day.   Stop smoking. If you do smoke, contact your caregiver to discuss ways in which they can help you quit.   Do not use street drugs.   Maintain a normal weight.   Maintain a healthy blood pressure.   Keep up with your blood work for checking the fats in your blood as directed by your caregiver.  Document Released: 08/28/2004 Document Revised: 10/30/2011 Document Reviewed: 03/26/2009 [employer] Patient Information 2012 [employer]. 

## 2012-07-21 NOTE — Assessment & Plan Note (Signed)
He is doing well on his current regimen, he will work to improve his lifestyle modifications, I will check his FLP and will advise further

## 2012-07-30 ENCOUNTER — Other Ambulatory Visit (INDEPENDENT_AMBULATORY_CARE_PROVIDER_SITE_OTHER): Payer: BC Managed Care – PPO

## 2012-07-30 DIAGNOSIS — M109 Gout, unspecified: Secondary | ICD-10-CM

## 2012-07-30 DIAGNOSIS — E785 Hyperlipidemia, unspecified: Secondary | ICD-10-CM

## 2012-07-30 DIAGNOSIS — I10 Essential (primary) hypertension: Secondary | ICD-10-CM

## 2012-07-30 LAB — COMPREHENSIVE METABOLIC PANEL
ALT: 69 U/L — ABNORMAL HIGH (ref 0–53)
AST: 46 U/L — ABNORMAL HIGH (ref 0–37)
CO2: 20 mEq/L (ref 19–32)
Calcium: 9.5 mg/dL (ref 8.4–10.5)
Chloride: 110 mEq/L (ref 96–112)
GFR: 83.76 mL/min (ref >60.00)
Sodium: 142 mEq/L (ref 135–145)
Total Bilirubin: 0.5 mg/dL (ref 0.3–1.2)
Total Protein: 7.3 g/dL (ref 6.0–8.3)

## 2012-07-30 LAB — LIPID PANEL
HDL: 37 mg/dL — ABNORMAL LOW (ref >39.00)
Total CHOL/HDL Ratio: 4
VLDL: 34.8 mg/dL (ref 0.0–40.0)

## 2012-07-30 LAB — TSH: TSH: 1.13 u[IU]/mL (ref 0.35–5.50)

## 2012-08-01 ENCOUNTER — Encounter: Payer: Self-pay | Admitting: Internal Medicine

## 2012-08-23 ENCOUNTER — Encounter: Payer: Self-pay | Admitting: Internal Medicine

## 2012-08-23 ENCOUNTER — Ambulatory Visit (INDEPENDENT_AMBULATORY_CARE_PROVIDER_SITE_OTHER): Payer: BC Managed Care – PPO | Admitting: Internal Medicine

## 2012-08-23 ENCOUNTER — Ambulatory Visit (INDEPENDENT_AMBULATORY_CARE_PROVIDER_SITE_OTHER)
Admission: RE | Admit: 2012-08-23 | Discharge: 2012-08-23 | Disposition: A | Discharge status: Home / Self Care | Payer: BC Managed Care – PPO | Source: Ambulatory Visit | Attending: Internal Medicine | Admitting: Internal Medicine

## 2012-08-23 VITALS — BP 112/70 | HR 65 | Temp 99.0°F | Resp 16 | Ht 71.0 in | Wt 275.0 lb

## 2012-08-23 DIAGNOSIS — S99919A Unspecified injury of unspecified ankle, initial encounter: Secondary | ICD-10-CM

## 2012-08-23 DIAGNOSIS — M7660 Achilles tendinitis, unspecified leg: Secondary | ICD-10-CM

## 2012-08-23 DIAGNOSIS — S86002A Unspecified injury of left Achilles tendon, initial encounter: Secondary | ICD-10-CM

## 2012-08-23 MED ORDER — CELECOXIB 200 MG PO CAPS: 200.0000 mg | ORAL_CAPSULE | Freq: Two times a day (BID) | ORAL | Status: DC

## 2012-08-23 NOTE — Patient Instructions (Signed)
Achilles Tendinitis Tendinitis a swelling and soreness of the tendon. The pain in the tendon (cord-like structure which attaches muscle to bone) is produced by tiny tears and the inflammation present in that tendon. It commonly occurs at the shoulders, heels, and elbows. It is usually caused by overusing the tendon and joint involved. Achilles tendinitis involves the Achilles tendon. This is the large tendon in the back of the leg just above the foot. It attaches the large muscles of the lower leg to the heel bone (called calcaneus).  This diagnosis (learning what is wrong) is made by examination. X-rays will be generally be normal if only tendinitis is present. HOME CARE INSTRUCTIONS   Apply ice to the injury for 15 to 20 minutes, 3 to 4 times per day. Put the ice in a plastic bag and place a towel between the bag of ice and your skin.   Try to avoid use other than gentle range of motion while the tendon is painful. Do not resume use until instructed by your caregiver. Then begin use gradually. Do not increase use to the point of pain. If pain does develop, decrease use and continue the above measures. Gradually increase activities that do not cause discomfort until you gradually achieve normal use.   Only take over-the-counter or prescription medicines for pain, discomfort, or fever as directed by your caregiver.  SEEK MEDICAL CARE IF:   Your pain and swelling increase or pain is uncontrolled with medications.   You develop new, unexplained problems (symptoms) or an increase of the symptoms that brought you to your caregiver.   You develop an inability to move your toes or foot, develop warmth and swelling in your foot, or begin running an unexplained temperature.  MAKE SURE YOU:   Understand these instructions.   Will watch your condition.   Will get help right away if you are not doing well or get worse.  Document Released: 08/20/2005 Document Revised: 10/30/2011 Document Reviewed:  06/28/2008 [employer] Patient Information 2012 [employer]. 

## 2012-08-24 ENCOUNTER — Encounter: Payer: Self-pay | Admitting: Internal Medicine

## 2012-08-24 NOTE — Assessment & Plan Note (Signed)
Start celebrex. He was given pt ed material. I asked him to rest and ice the left ankle and to start PT soon.

## 2012-08-24 NOTE — Progress Notes (Signed)
  Subjective:    Patient ID: Rickey Hurley, male    DOB: December 02, 1965, 46 y.o.   MRN: [id number]  Ankle Injury  Incident onset: 8 days ago. The incident occurred at home. The injury mechanism was a twisting injury. The pain is present in the left ankle. The quality of the pain is described as aching. The pain is at a severity of 3/10. The pain is mild. The pain has been constant since onset. Associated symptoms include a loss of motion. Pertinent negatives include no inability to bear weight, loss of sensation, muscle weakness, numbness or tingling. The symptoms are aggravated by movement, palpation and weight bearing. He has tried acetaminophen for the symptoms. The treatment provided no relief.      Review of Systems  Constitutional: Negative.   HENT: Negative.   Eyes: Negative.   Respiratory: Negative.   Cardiovascular: Negative.   Gastrointestinal: Negative.   Genitourinary: Negative.   Musculoskeletal: Positive for arthralgias (left posterior ankle). Negative for myalgias, back pain, joint swelling and gait problem.  Skin: Negative.   Neurological: Negative.  Negative for tingling and numbness.  Hematological: Negative.   Psychiatric/Behavioral: Negative.        Objective:   Physical Exam  Musculoskeletal:       Left ankle: He exhibits normal range of motion, no swelling, no ecchymosis, no deformity, no laceration and normal pulse. tenderness. Achilles tendon exhibits pain. Achilles tendon exhibits no defect and normal Thompson's test results.          Assessment & Plan:

## 2012-08-24 NOTE — Assessment & Plan Note (Signed)
Plain film is negative for fracture

## 2012-08-27 ENCOUNTER — Telehealth: Payer: Self-pay | Admitting: Internal Medicine

## 2012-08-27 DIAGNOSIS — M7660 Achilles tendinitis, unspecified leg: Secondary | ICD-10-CM

## 2012-08-27 DIAGNOSIS — S86002A Unspecified injury of left Achilles tendon, initial encounter: Secondary | ICD-10-CM

## 2012-08-27 NOTE — Telephone Encounter (Signed)
Pt advised.

## 2012-08-27 NOTE — Telephone Encounter (Signed)
Patient was under the impression that he was being referred to be fitted for a boot before starting PT, he would like to discuss this with someone

## 2012-08-27 NOTE — Telephone Encounter (Signed)
Referral done

## 2012-08-27 NOTE — Telephone Encounter (Signed)
Pt states that [facility] advised him that they do not do fittings for boot at that clinic. Pt is requesting additional referral to have this done as discussed at [facility], please advise.

## 2013-01-08 ENCOUNTER — Other Ambulatory Visit: Payer: Self-pay

## 2013-03-04 ENCOUNTER — Encounter: Payer: Self-pay | Admitting: Internal Medicine

## 2013-03-04 ENCOUNTER — Ambulatory Visit (INDEPENDENT_AMBULATORY_CARE_PROVIDER_SITE_OTHER): Payer: BC Managed Care – PPO | Admitting: Internal Medicine

## 2013-03-04 ENCOUNTER — Ambulatory Visit (INDEPENDENT_AMBULATORY_CARE_PROVIDER_SITE_OTHER)
Admission: RE | Admit: 2013-03-04 | Discharge: 2013-03-04 | Disposition: A | Discharge status: Home / Self Care | Payer: BC Managed Care – PPO | Source: Ambulatory Visit | Attending: Internal Medicine | Admitting: Internal Medicine

## 2013-03-04 VITALS — BP 130/72 | HR 60 | Temp 98.2°F | Resp 16 | Wt 293.8 lb

## 2013-03-04 DIAGNOSIS — M7660 Achilles tendinitis, unspecified leg: Secondary | ICD-10-CM

## 2013-03-04 DIAGNOSIS — M25579 Pain in unspecified ankle and joints of unspecified foot: Secondary | ICD-10-CM

## 2013-03-04 DIAGNOSIS — M25572 Pain in left ankle and joints of left foot: Secondary | ICD-10-CM

## 2013-03-04 DIAGNOSIS — M7662 Achilles tendinitis, left leg: Secondary | ICD-10-CM

## 2013-03-04 NOTE — Patient Instructions (Signed)
Foot Sprain The muscles and cord like structures which attach muscle to bone (tendons) that surround the feet are made up of units. A foot sprain can occur at the weakest spot in any of these units. This condition is most often caused by injury to or overuse of the foot, as from playing contact sports, or aggravating a previous injury, or from poor conditioning, or obesity. SYMPTOMS  Pain with movement of the foot.  Tenderness and swelling at the injury site.  Loss of strength is present in moderate or severe sprains. THE THREE GRADES OR SEVERITY OF FOOT SPRAIN ARE:  Mild (Grade I): Slightly pulled muscle without tearing of muscle or tendon fibers or loss of strength.  Moderate (Grade II): Tearing of fibers in a muscle, tendon, or at the attachment to bone, with small decrease in strength.  Severe (Grade III): Rupture of the muscle-tendon-bone attachment, with separation of fibers. Severe sprain requires surgical repair. Often repeating (chronic) sprains are caused by overuse. Sudden (acute) sprains are caused by direct injury or over-use. DIAGNOSIS  Diagnosis of this condition is usually by your own observation. If problems continue, a caregiver may be required for further evaluation and treatment. X-rays may be required to make sure there are not breaks in the bones (fractures) present. Continued problems may require physical therapy for treatment. PREVENTION  Use strength and conditioning exercises appropriate for your sport.  Warm up properly prior to working out.  Use athletic shoes that are made for the sport you are participating in.  Allow adequate time for healing. Early return to activities makes repeat injury more likely, and can lead to an unstable arthritic foot that can result in prolonged disability. Mild sprains generally heal in 3 to 10 days, with moderate and severe sprains taking 2 to 10 weeks. Your caregiver can help you determine the proper time required for  healing. HOME CARE INSTRUCTIONS   Apply ice to the injury for 15 to 20 minutes, 3 to 4 times per day. Put the ice in a plastic bag and place a towel between the bag of ice and your skin.  An elastic wrap (like an Ace bandage) may be used to keep swelling down.  Keep foot above the level of the heart, or at least raised on a footstool, when swelling and pain are present.  Try to avoid use other than gentle range of motion while the foot is painful. Do not resume use until instructed by your caregiver. Then begin use gradually, not increasing use to the point of pain. If pain does develop, decrease use and continue the above measures, gradually increasing activities that do not cause discomfort, until you gradually achieve normal use.  Use crutches if and as instructed, and for the length of time instructed.  Keep injured foot and ankle wrapped between treatments.  Massage foot and ankle for comfort and to keep swelling down. Massage from the toes up towards the knee.  Only take over-the-counter or prescription medicines for pain, discomfort, or fever as directed by your caregiver. SEEK IMMEDIATE MEDICAL CARE IF:   Your pain and swelling increase, or pain is not controlled with medications.  You have loss of feeling in your foot or your foot turns cold or blue.  You develop new, unexplained symptoms, or an increase of the symptoms that brought you to your caregiver. MAKE SURE YOU:   Understand these instructions.  Will watch your condition.  Will get help right away if you are not doing well or   get worse. Document Released: 05/02/2002 Document Revised: 02/02/2012 Document Reviewed: 06/29/2008 [employer] Patient Information 2013 [employer].  

## 2013-03-04 NOTE — Progress Notes (Signed)
  Subjective:    Patient ID: Rickey Hurley, male    DOB: [birth year], 47 y.o.   MRN: [id number]  Arthritis Presents for follow-up visit. He complains of pain, stiffness and joint swelling. He reports no joint warmth. The symptoms have been worsening. Affected locations include the left foot and left ankle. His pain is at a severity of 2/10. Pertinent negatives include no diarrhea, dry eyes, dry mouth, dysuria, fatigue, fever, pain at night, pain while resting, rash, Raynaud's syndrome, uveitis or weight loss. Compliance with total regimen is 0-25%. Compliance problems include psychosocial issues.  Compliance with medications is 51-75%.      Review of Systems  Constitutional: Positive for unexpected weight change (weight gain). Negative for fever, chills, weight loss, diaphoresis, activity change, appetite change and fatigue.  HENT: Negative.   Eyes: Negative.   Respiratory: Negative.  Negative for cough, choking, chest tightness, shortness of breath, wheezing and stridor.   Cardiovascular: Negative.  Negative for chest pain, palpitations and leg swelling.  Gastrointestinal: Negative.  Negative for nausea, vomiting, abdominal pain and diarrhea.  Endocrine: Negative.   Genitourinary: Negative.  Negative for dysuria.  Musculoskeletal: Positive for joint swelling, arthritis and stiffness. Negative for myalgias, back pain, arthralgias and gait problem.  Skin: Negative.  Negative for color change, pallor, rash and wound.  Allergic/Immunologic: Negative.   Neurological: Negative.   Hematological: Negative.  Negative for adenopathy. Does not bruise/bleed easily.  Psychiatric/Behavioral: Negative.        Objective:   Physical Exam  Musculoskeletal:       Left ankle: He exhibits swelling. He exhibits normal range of motion, no ecchymosis, no deformity, no laceration and normal pulse. Tenderness. Lateral malleolus tenderness found. No medial malleolus, no AITFL, no CF ligament, no posterior TFL, no  head of 5th metatarsal and no proximal fibula tenderness found. Achilles tendon exhibits pain. Achilles tendon exhibits no defect and normal Thompson's test results.       Left foot: He exhibits tenderness, bony tenderness, swelling and deformity. He exhibits normal range of motion, normal capillary refill, no crepitus and no laceration.       Feet:     Lab Results  Component Value Date   WBC 6.2 05/14/2012   HGB 13.2 05/14/2012   HCT 38.4* 05/14/2012   PLT 242.0 05/14/2012   GLUCOSE 94 07/30/2012   CHOL 130 07/30/2012   TRIG 174.0* 07/30/2012   HDL 37.00* 07/30/2012   LDLDIRECT 100.8 08/21/2011   LDLCALC 58 07/30/2012   ALT 69* 07/30/2012   AST 46* 07/30/2012   NA 142 07/30/2012   K 3.9 07/30/2012   CL 110 07/30/2012   CREATININE 1.0 07/30/2012   BUN 17 07/30/2012   CO2 20 07/30/2012   TSH 1.13 07/30/2012   PSA 0.43 08/21/2011       Assessment & Plan:

## 2013-03-06 ENCOUNTER — Encounter: Payer: Self-pay | Admitting: Internal Medicine

## 2013-03-06 NOTE — Assessment & Plan Note (Signed)
He will continue nsaids I have asked him to see sports medicine for further evaluation

## 2013-03-06 NOTE — Assessment & Plan Note (Signed)
This started about 8 months ago, he was referred to ortho and they told him to start PT which he did not do His s/s have persisted and worsened to some degree His plain films today are normal I have asked him to see sports medicine for further evaluation

## 2013-06-14 ENCOUNTER — Encounter: Payer: Self-pay | Admitting: Internal Medicine

## 2013-08-08 ENCOUNTER — Other Ambulatory Visit: Payer: Self-pay | Admitting: Internal Medicine

## 2013-09-01 ENCOUNTER — Other Ambulatory Visit: Payer: Self-pay | Admitting: Internal Medicine

## 2013-09-29 ENCOUNTER — Other Ambulatory Visit: Payer: Self-pay

## 2014-01-25 ENCOUNTER — Other Ambulatory Visit: Payer: Self-pay | Admitting: Internal Medicine

## 2014-01-25 ENCOUNTER — Encounter: Payer: Self-pay | Admitting: Internal Medicine

## 2014-01-25 DIAGNOSIS — Z0000 Encounter for general adult medical examination without abnormal findings: Secondary | ICD-10-CM

## 2014-01-25 DIAGNOSIS — M109 Gout, unspecified: Secondary | ICD-10-CM

## 2014-01-25 MED ORDER — DICLOFENAC SODIUM 75 MG PO TBEC: 75.0000 mg | DELAYED_RELEASE_TABLET | Freq: Two times a day (BID) | ORAL | Status: DC

## 2014-01-30 ENCOUNTER — Other Ambulatory Visit (INDEPENDENT_AMBULATORY_CARE_PROVIDER_SITE_OTHER): Payer: BC Managed Care – PPO

## 2014-01-30 ENCOUNTER — Encounter: Payer: Self-pay | Admitting: Internal Medicine

## 2014-01-30 DIAGNOSIS — M109 Gout, unspecified: Secondary | ICD-10-CM

## 2014-01-30 DIAGNOSIS — Z0000 Encounter for general adult medical examination without abnormal findings: Secondary | ICD-10-CM

## 2014-01-30 LAB — URINALYSIS, ROUTINE W REFLEX MICROSCOPIC
Bilirubin Urine: NEGATIVE
HGB URINE DIPSTICK: NEGATIVE
Ketones, ur: NEGATIVE
LEUKOCYTES UA: NEGATIVE
NITRITE: NEGATIVE
PH: 5.5 (ref 5.0–8.0)
RBC / HPF: NONE SEEN (ref 0–?)
Specific Gravity, Urine: >=1.03 — AB (ref 1.000–1.030)
TOTAL PROTEIN, URINE-UPE24: NEGATIVE
Urine Glucose: NEGATIVE
Urobilinogen, UA: 0.2 (ref 0.0–1.0)

## 2014-01-30 LAB — CBC WITH DIFFERENTIAL/PLATELET
BASOS PCT: 1.3 % (ref 0.0–3.0)
Basophils Absolute: 0.1 10*3/uL (ref 0.0–0.1)
EOS PCT: 2.3 % (ref 0.0–5.0)
Eosinophils Absolute: 0.1 10*3/uL (ref 0.0–0.7)
HCT: 38 % — ABNORMAL LOW (ref 39.0–52.0)
HEMOGLOBIN: 12.9 g/dL — AB (ref 13.0–17.0)
LYMPHS PCT: 33.4 % (ref 12.0–46.0)
Lymphs Abs: 2 10*3/uL (ref 0.7–4.0)
MCHC: 33.9 g/dL (ref 30.0–36.0)
MCV: 90.2 fl (ref 78.0–100.0)
MONOS PCT: 8.9 % (ref 3.0–12.0)
Monocytes Absolute: 0.5 10*3/uL (ref 0.1–1.0)
NEUTROS ABS: 3.2 10*3/uL (ref 1.4–7.7)
NEUTROS PCT: 54.1 % (ref 43.0–77.0)
Platelets: 268 10*3/uL (ref 150.0–400.0)
RBC: 4.21 Mil/uL — AB (ref 4.22–5.81)
RDW: 13.3 % (ref 11.5–14.6)
WBC: 5.9 10*3/uL (ref 4.5–10.5)

## 2014-01-30 LAB — COMPREHENSIVE METABOLIC PANEL
ALBUMIN: 4.4 g/dL (ref 3.5–5.2)
ALT: 59 U/L — ABNORMAL HIGH (ref 0–53)
AST: 29 U/L (ref 0–37)
Alkaline Phosphatase: 42 U/L (ref 39–117)
BUN: 17 mg/dL (ref 6–23)
CALCIUM: 9.6 mg/dL (ref 8.4–10.5)
CHLORIDE: 107 meq/L (ref 96–112)
CO2: 25 meq/L (ref 19–32)
CREATININE: 1 mg/dL (ref 0.4–1.5)
GFR: 83.21 mL/min (ref >60.00)
GLUCOSE: 113 mg/dL — AB (ref 70–99)
Potassium: 4.4 mEq/L (ref 3.5–5.1)
Sodium: 140 mEq/L (ref 135–145)
Total Bilirubin: 0.6 mg/dL (ref 0.3–1.2)
Total Protein: 6.9 g/dL (ref 6.0–8.3)

## 2014-01-30 LAB — LIPID PANEL
CHOLESTEROL: 168 mg/dL (ref 0–200)
HDL: 40.4 mg/dL (ref >39.00)
LDL CALC: 97 mg/dL (ref 0–99)
Total CHOL/HDL Ratio: 4
Triglycerides: 154 mg/dL — ABNORMAL HIGH (ref 0.0–149.0)
VLDL: 30.8 mg/dL (ref 0.0–40.0)

## 2014-01-30 LAB — URIC ACID: URIC ACID, SERUM: 5 mg/dL (ref 4.0–7.8)

## 2014-01-30 LAB — TSH: TSH: 2.55 u[IU]/mL (ref 0.35–5.50)

## 2014-04-25 ENCOUNTER — Encounter: Payer: Self-pay | Admitting: Internal Medicine

## 2014-05-02 ENCOUNTER — Other Ambulatory Visit (INDEPENDENT_AMBULATORY_CARE_PROVIDER_SITE_OTHER): Payer: BC Managed Care – PPO

## 2014-05-02 ENCOUNTER — Encounter: Payer: Self-pay | Admitting: Internal Medicine

## 2014-05-02 ENCOUNTER — Ambulatory Visit (INDEPENDENT_AMBULATORY_CARE_PROVIDER_SITE_OTHER): Payer: BC Managed Care – PPO | Admitting: Internal Medicine

## 2014-05-02 VITALS — BP 120/76 | HR 65 | Temp 98.6°F | Resp 16 | Ht 71.0 in | Wt 300.6 lb

## 2014-05-02 DIAGNOSIS — R7309 Other abnormal glucose: Secondary | ICD-10-CM

## 2014-05-02 DIAGNOSIS — D539 Nutritional anemia, unspecified: Secondary | ICD-10-CM | POA: Insufficient documentation

## 2014-05-02 DIAGNOSIS — Z860100 Personal history of colon polyps, unspecified: Secondary | ICD-10-CM

## 2014-05-02 DIAGNOSIS — D649 Anemia, unspecified: Secondary | ICD-10-CM

## 2014-05-02 DIAGNOSIS — J4520 Mild intermittent asthma, uncomplicated: Secondary | ICD-10-CM | POA: Insufficient documentation

## 2014-05-02 DIAGNOSIS — I10 Essential (primary) hypertension: Secondary | ICD-10-CM

## 2014-05-02 DIAGNOSIS — J45909 Unspecified asthma, uncomplicated: Secondary | ICD-10-CM

## 2014-05-02 DIAGNOSIS — Z0000 Encounter for general adult medical examination without abnormal findings: Secondary | ICD-10-CM

## 2014-05-02 LAB — VITAMIN B12: VITAMIN B 12: 347 pg/mL (ref 211–911)

## 2014-05-02 LAB — CBC WITH DIFFERENTIAL/PLATELET
BASOS PCT: 0.7 % (ref 0.0–3.0)
Basophils Absolute: 0 10*3/uL (ref 0.0–0.1)
EOS ABS: 0.1 10*3/uL (ref 0.0–0.7)
EOS PCT: 2.2 % (ref 0.0–5.0)
HEMATOCRIT: 36.5 % — AB (ref 39.0–52.0)
Hemoglobin: 12.7 g/dL — ABNORMAL LOW (ref 13.0–17.0)
LYMPHS ABS: 2 10*3/uL (ref 0.7–4.0)
Lymphocytes Relative: 30.4 % (ref 12.0–46.0)
MCHC: 34.9 g/dL (ref 30.0–36.0)
MCV: 88.9 fl (ref 78.0–100.0)
MONO ABS: 0.4 10*3/uL (ref 0.1–1.0)
Monocytes Relative: 6.5 % (ref 3.0–12.0)
NEUTROS PCT: 60.2 % (ref 43.0–77.0)
Neutro Abs: 4 10*3/uL (ref 1.4–7.7)
PLATELETS: 258 10*3/uL (ref 150.0–400.0)
RBC: 4.1 Mil/uL — ABNORMAL LOW (ref 4.22–5.81)
RDW: 13.4 % (ref 11.5–15.5)
WBC: 6.7 10*3/uL (ref 4.0–10.5)

## 2014-05-02 LAB — BASIC METABOLIC PANEL
BUN: 19 mg/dL (ref 6–23)
CHLORIDE: 107 meq/L (ref 96–112)
CO2: 23 meq/L (ref 19–32)
CREATININE: 0.9 mg/dL (ref 0.4–1.5)
Calcium: 9.5 mg/dL (ref 8.4–10.5)
GFR: 91.33 mL/min (ref >60.00)
Glucose, Bld: 92 mg/dL (ref 70–99)
POTASSIUM: 4.2 meq/L (ref 3.5–5.1)
Sodium: 138 mEq/L (ref 135–145)

## 2014-05-02 LAB — IBC PANEL
IRON: 94 ug/dL (ref 42–165)
Saturation Ratios: 25.3 % (ref 20.0–50.0)
Transferrin: 265.4 mg/dL (ref 212.0–360.0)

## 2014-05-02 LAB — RETICULOCYTES
ABS RETIC: 71.1 10*3/uL (ref 19.0–186.0)
RBC.: 4.18 MIL/uL — AB (ref 4.22–5.81)
RETIC CT PCT: 1.7 % (ref 0.4–2.3)

## 2014-05-02 LAB — HEMOGLOBIN A1C: HEMOGLOBIN A1C: 5.8 % (ref 4.6–6.5)

## 2014-05-02 LAB — FOLATE

## 2014-05-02 LAB — FERRITIN: Ferritin: 162.1 ng/mL (ref 22.0–322.0)

## 2014-05-02 MED ORDER — SULFACETAMIDE SODIUM-SULFUR 10-5 % EX CREA: 1.0000 "application " | TOPICAL_CREAM | Freq: Two times a day (BID) | CUTANEOUS | Status: DC | PRN

## 2014-05-02 MED ORDER — FENOFIBRATE 160 MG PO TABS: ORAL_TABLET | ORAL | Status: DC

## 2014-05-02 MED ORDER — SIMVASTATIN 40 MG PO TABS: ORAL_TABLET | ORAL | Status: DC

## 2014-05-02 MED ORDER — METOPROLOL TARTRATE 25 MG PO TABS: ORAL_TABLET | ORAL | Status: DC

## 2014-05-02 MED ORDER — FLUTICASONE FUROATE-VILANTEROL 100-25 MCG/INH IN AEPB: 1.0000 | INHALATION_SPRAY | Freq: Every day | RESPIRATORY_TRACT | Status: DC

## 2014-05-02 MED ORDER — DICLOFENAC SODIUM 75 MG PO TBEC: 75.0000 mg | DELAYED_RELEASE_TABLET | Freq: Two times a day (BID) | ORAL | Status: DC

## 2014-05-02 MED ORDER — LISINOPRIL 10 MG PO TABS: ORAL_TABLET | ORAL | Status: DC

## 2014-05-02 MED ORDER — ALLOPURINOL 300 MG PO TABS: ORAL_TABLET | ORAL | Status: DC

## 2014-05-02 NOTE — Progress Notes (Signed)
Pre visit review using our clinic review tool, if applicable. No additional management support is needed unless otherwise documented below in the visit note. 

## 2014-05-02 NOTE — Progress Notes (Signed)
   Subjective:    Patient ID: Rickey Hurley, male    DOB: 1966-03-08, 48 y.o.   MRN: [id number]  Anemia Presents for follow-up visit. There has been no abdominal pain, anorexia, bruising/bleeding easily, confusion, fever, leg swelling, light-headedness, malaise/fatigue, pallor, palpitations, paresthesias, pica or weight loss. Signs of blood loss that are not present include hematemesis, hematochezia and melena. Past treatments include nothing. There are no compliance problems.       Review of Systems  Constitutional: Negative.  Negative for fever, chills, weight loss, malaise/fatigue, diaphoresis, appetite change and fatigue.  HENT: Negative.   Eyes: Negative.   Respiratory: Positive for wheezing (he wheezes at night). Negative for apnea, cough, choking, chest tightness, shortness of breath and stridor.   Cardiovascular: Negative.  Negative for chest pain, palpitations and leg swelling.  Gastrointestinal: Positive for diarrhea. Negative for nausea, vomiting, abdominal pain, constipation, blood in stool, melena, hematochezia, anorexia and hematemesis.  Endocrine: Negative.   Genitourinary: Negative.   Musculoskeletal: Negative.  Negative for arthralgias, back pain, gait problem, joint swelling, myalgias, neck pain and neck stiffness.  Skin: Negative.  Negative for color change, pallor and wound.  Allergic/Immunologic: Negative.   Neurological: Negative.  Negative for dizziness, syncope, speech difficulty, weakness, light-headedness, numbness, headaches and paresthesias.  Hematological: Negative.  Negative for adenopathy. Does not bruise/bleed easily.  Psychiatric/Behavioral: Negative.  Negative for confusion.       Objective:   Physical Exam  Vitals reviewed. Constitutional: He is oriented to person, place, and time. He appears well-developed and well-nourished. No distress.  HENT:  Head: Normocephalic and atraumatic.  Mouth/Throat: Oropharynx is clear and moist. No oropharyngeal  exudate.  Eyes: Conjunctivae are normal. Right eye exhibits no discharge. Left eye exhibits no discharge. No scleral icterus.  Neck: Normal range of motion. Neck supple. No JVD present. No tracheal deviation present. No thyromegaly present.  Cardiovascular: Normal rate, regular rhythm, normal heart sounds and intact distal pulses.  Exam reveals no gallop and no friction rub.   No murmur heard. Pulmonary/Chest: Effort normal and breath sounds normal. No stridor. No respiratory distress. He has no wheezes. He has no rales. He exhibits no tenderness.  Abdominal: Soft. Bowel sounds are normal. He exhibits no distension and no mass. There is no tenderness. There is no rebound and no guarding.  Musculoskeletal: Normal range of motion. He exhibits no edema and no tenderness.  Lymphadenopathy:    He has no cervical adenopathy.  Neurological: He is oriented to person, place, and time.  Skin: Skin is warm and dry. No rash noted. He is not diaphoretic. No erythema. No pallor.  Psychiatric: He has a normal mood and affect. His behavior is normal. Judgment and thought content normal.     Lab Results  Component Value Date   WBC 5.9 01/30/2014   HGB 12.9* 01/30/2014   HCT 38.0* 01/30/2014   PLT 268.0 01/30/2014   GLUCOSE 113* 01/30/2014   CHOL 168 01/30/2014   TRIG 154.0* 01/30/2014   HDL 40.40 01/30/2014   LDLDIRECT 100.8 08/21/2011   LDLCALC 97 01/30/2014   ALT 59* 01/30/2014   AST 29 01/30/2014   NA 140 01/30/2014   K 4.4 01/30/2014   CL 107 01/30/2014   CREATININE 1.0 01/30/2014   BUN 17 01/30/2014   CO2 25 01/30/2014   TSH 2.55 01/30/2014   PSA 0.43 08/21/2011       Assessment & Plan:

## 2014-05-02 NOTE — Patient Instructions (Signed)

## 2014-05-03 ENCOUNTER — Encounter: Payer: Self-pay | Admitting: Internal Medicine

## 2014-05-03 ENCOUNTER — Telehealth: Payer: Self-pay | Admitting: Internal Medicine

## 2014-05-03 NOTE — Assessment & Plan Note (Signed)
Exam done Vaccines were reviewed Labs reviewed Pt ed material was given

## 2014-05-03 NOTE — Assessment & Plan Note (Signed)
His BP is well controlled 

## 2014-05-03 NOTE — Assessment & Plan Note (Signed)
I have asked him to start [facility] for this

## 2014-05-03 NOTE — Assessment & Plan Note (Signed)
I have asked him to see GI to consider endoscopy to look for sources of blood loss I will recheck his CBC today and will check his vitamin levels He also reports an occasional diarrhea so I will check a celiac panel on him

## 2014-05-03 NOTE — Assessment & Plan Note (Signed)
I will check his A1C to see if he has DM2

## 2014-05-03 NOTE — Telephone Encounter (Signed)
Relevant patient education mailed to patient.  

## 2014-05-03 NOTE — Assessment & Plan Note (Signed)
He is due for a f/up with GI

## 2014-05-04 ENCOUNTER — Encounter: Payer: Self-pay | Admitting: Internal Medicine

## 2014-05-04 LAB — GLIADIN ANTIBODIES, SERUM
Gliadin IgA: 8.4 U/mL (ref <20)
Gliadin IgG: 3.6 U/mL (ref <20)

## 2014-05-04 LAB — TISSUE TRANSGLUTAMINASE, IGA: Tissue Transglutaminase Ab, IgA: 3.4 U/mL (ref <20)

## 2014-05-05 LAB — RETICULIN ANTIBODIES, IGA W TITER: Reticulin Ab, IgA: NEGATIVE

## 2014-05-19 ENCOUNTER — Encounter: Payer: Self-pay | Admitting: Internal Medicine

## 2014-05-19 ENCOUNTER — Other Ambulatory Visit (INDEPENDENT_AMBULATORY_CARE_PROVIDER_SITE_OTHER): Payer: BC Managed Care – PPO

## 2014-05-19 ENCOUNTER — Ambulatory Visit (INDEPENDENT_AMBULATORY_CARE_PROVIDER_SITE_OTHER): Payer: BC Managed Care – PPO | Admitting: Internal Medicine

## 2014-05-19 VITALS — BP 120/80 | HR 89 | Temp 98.1°F | Resp 16 | Ht 71.0 in | Wt 293.4 lb

## 2014-05-19 DIAGNOSIS — R0989 Other specified symptoms and signs involving the circulatory and respiratory systems: Secondary | ICD-10-CM

## 2014-05-19 DIAGNOSIS — R0609 Other forms of dyspnea: Secondary | ICD-10-CM

## 2014-05-19 LAB — CBC WITH DIFFERENTIAL/PLATELET
BASOS ABS: 0.1 10*3/uL (ref 0.0–0.1)
Basophils Relative: 0.8 % (ref 0.0–3.0)
Eosinophils Absolute: 0.1 10*3/uL (ref 0.0–0.7)
Eosinophils Relative: 1.8 % (ref 0.0–5.0)
HCT: 38.7 % — ABNORMAL LOW (ref 39.0–52.0)
HEMOGLOBIN: 13.4 g/dL (ref 13.0–17.0)
LYMPHS PCT: 30.4 % (ref 12.0–46.0)
Lymphs Abs: 2 10*3/uL (ref 0.7–4.0)
MCHC: 34.7 g/dL (ref 30.0–36.0)
MCV: 88.7 fl (ref 78.0–100.0)
Monocytes Absolute: 0.4 10*3/uL (ref 0.1–1.0)
Monocytes Relative: 6.5 % (ref 3.0–12.0)
Neutro Abs: 4.1 10*3/uL (ref 1.4–7.7)
Neutrophils Relative %: 60.5 % (ref 43.0–77.0)
PLATELETS: 274 10*3/uL (ref 150.0–400.0)
RBC: 4.36 Mil/uL (ref 4.22–5.81)
RDW: 13 % (ref 11.5–15.5)
WBC: 6.7 10*3/uL (ref 4.0–10.5)

## 2014-05-19 LAB — BRAIN NATRIURETIC PEPTIDE: Pro B Natriuretic peptide (BNP): 7 pg/mL (ref 0.0–100.0)

## 2014-05-19 LAB — CARDIAC PANEL
CK-MB: 2.2 ng/mL (ref 0.3–4.0)
Relative Index: 0.8 calc (ref 0.0–2.5)
Total CK: 276 U/L — ABNORMAL HIGH (ref 7–232)

## 2014-05-19 LAB — TROPONIN I: Troponin I: 0.02 ng/mL (ref <0.06)

## 2014-05-19 NOTE — Progress Notes (Signed)
   Subjective:    Patient ID: Rickey GuileJim Hurley, male    DOB: [birth year], 48 y.o.   MRN: [id number]  HPI Comments: He returns and complains that he has developed some DOE over the last week.     Review of Systems  Constitutional: Negative.  Negative for fever, chills, diaphoresis, activity change, appetite change, fatigue and unexpected weight change.  HENT: Negative.   Eyes: Negative.   Respiratory: Positive for shortness of breath. Negative for apnea, cough, choking, chest tightness, wheezing and stridor.   Cardiovascular: Negative.  Negative for chest pain, palpitations and leg swelling.  Gastrointestinal: Negative.  Negative for abdominal pain.  Endocrine: Negative.   Genitourinary: Negative.   Musculoskeletal: Negative.   Skin: Negative.   Allergic/Immunologic: Negative.   Neurological: Negative.  Negative for dizziness, tremors, seizures, syncope, facial asymmetry, speech difficulty, weakness, light-headedness, numbness and headaches.  Hematological: Negative.  Negative for adenopathy. Does not bruise/bleed easily.  Psychiatric/Behavioral: Negative.        Objective:   Physical Exam  Vitals reviewed. Constitutional: He is oriented to person, place, and time. He appears well-developed and well-nourished. No distress.  HENT:  Head: Normocephalic and atraumatic.  Mouth/Throat: Oropharynx is clear and moist. No oropharyngeal exudate.  Eyes: Conjunctivae are normal. Right eye exhibits no discharge. Left eye exhibits no discharge. No scleral icterus.  Neck: Normal range of motion. Neck supple. No JVD present. No tracheal deviation present. No thyromegaly present.  Cardiovascular: Normal rate, regular rhythm, normal heart sounds and intact distal pulses.  Exam reveals no gallop and no friction rub.   No murmur heard. Pulmonary/Chest: Effort normal and breath sounds normal. No stridor. No respiratory distress. He has no wheezes. He has no rales. He exhibits no tenderness.  Abdominal:  Soft. Bowel sounds are normal. He exhibits no distension and no mass. There is no tenderness. There is no rebound and no guarding.  Musculoskeletal: Normal range of motion. He exhibits no edema and no tenderness.  Lymphadenopathy:    He has no cervical adenopathy.  Neurological: He is oriented to person, place, and time.  Skin: Skin is warm and dry. No rash noted. He is not diaphoretic. No erythema. No pallor.     Lab Results  Component Value Date   WBC 6.7 05/02/2014   HGB 12.7* 05/02/2014   HCT 36.5* 05/02/2014   PLT 258.0 05/02/2014   GLUCOSE 92 05/02/2014   CHOL 168 01/30/2014   TRIG 154.0* 01/30/2014   HDL 40.40 01/30/2014   LDLDIRECT 100.8 08/21/2011   LDLCALC 97 01/30/2014   ALT 59* 01/30/2014   AST 29 01/30/2014   NA 138 05/02/2014   K 4.2 05/02/2014   CL 107 05/02/2014   CREATININE 0.9 05/02/2014   BUN 19 05/02/2014   CO2 23 05/02/2014   TSH 2.55 01/30/2014   PSA 0.43 08/21/2011   HGBA1C 5.8 05/02/2014       Assessment & Plan:

## 2014-05-19 NOTE — Progress Notes (Signed)
Pre visit review using our clinic review tool, if applicable. No additional management support is needed unless otherwise documented below in the visit note. 

## 2014-05-19 NOTE — Patient Instructions (Signed)
Shortness of Breath  Shortness of breath means you have trouble breathing. It could also mean that you have a medical problem. You should get immediate medical care for shortness of breath.  CAUSES   · Not enough oxygen in the air such as with high altitudes or a smoke-filled room.  · Certain lung diseases, infections, or problems.  · Heart disease or conditions, such as angina or heart failure.  · Low red blood cells (anemia).  · Poor physical fitness, which can cause shortness of breath when you exercise.  · Chest or back injuries or stiffness.  · Being overweight.  · Smoking.  · Anxiety, which can make you feel like you are not getting enough air.  DIAGNOSIS   Serious medical problems can often be found during your physical exam. Tests may also be done to determine why you are having shortness of breath. Tests may include:  · Chest X-rays.  · Lung function tests.  · Blood tests.  · An electrocardiogram (ECG).  · An ambulatory electrocardiogram. An ambulatory ECG records your heartbeat patterns over a 24-hour period.  · Exercise testing.  · A transthoracic echocardiogram (TTE). During echocardiography, sound waves are used to evaluate how blood flows through your heart.  · A transesophageal echocardiogram (TEE).  · Imaging scans.  Your health care Leone Mobley may not be able to find a cause for your shortness of breath after your exam. In this case, it is important to have a follow-up exam with your health care Zara Wendt as directed.   TREATMENT   Treatment for shortness of breath depends on the cause of your symptoms and can vary greatly.  HOME CARE INSTRUCTIONS   · Do not smoke. Smoking is a common cause of shortness of breath. If you smoke, ask for help to quit.  · Avoid being around chemicals or things that may bother your breathing, such as paint fumes and dust.  · Rest as needed. Slowly resume your usual activities.  · If medicines were prescribed, take them as directed for the full length of time directed. This  includes oxygen and any inhaled medicines.  · Keep all follow-up appointments as directed by your health care Jadia Capers.  SEEK MEDICAL CARE IF:   · Your condition does not improve in the time expected.  · You have a hard time doing your normal activities even with rest.  · You have any new symptoms.  SEEK IMMEDIATE MEDICAL CARE IF:   · Your shortness of breath gets worse.  · You feel light-headed, faint, or develop a cough not controlled with medicines.  · You start coughing up blood.  · You have pain with breathing.  · You have chest pain or pain in your arms, shoulders, or abdomen.  · You have a fever.  · You are unable to walk up stairs or exercise the way you normally do.  MAKE SURE YOU:  · Understand these instructions.  · Will watch your condition.  · Will get help right away if you are not doing well or get worse.  Document Released: 08/05/2001 Document Revised: 11/15/2013 Document Reviewed: 01/26/2012  ExitCare® Patient Information ©2015 [employer]. This information is not intended to replace advice given to you by your health care Michaella Imai. Make sure you discuss any questions you have with your health care Lamontae Ricardo.

## 2014-05-20 ENCOUNTER — Encounter: Payer: Self-pay | Admitting: Internal Medicine

## 2014-05-20 NOTE — Assessment & Plan Note (Signed)
EKG shows sinus brady but there are no acute changes, H/H are improved His troponin, CK, and BNP are normal I am concerned that the bradycardia may be causing his symptoms so I have asked him to do an ETT to see if his HR increases appropriately and to screen for CAD IF his DOE is related to the bradycardia then I will taper his metoprolol dose

## 2014-06-16 LAB — HM COLONOSCOPY

## 2014-06-23 ENCOUNTER — Ambulatory Visit (INDEPENDENT_AMBULATORY_CARE_PROVIDER_SITE_OTHER): Payer: BC Managed Care – PPO | Admitting: Physician Assistant

## 2014-06-23 DIAGNOSIS — R0989 Other specified symptoms and signs involving the circulatory and respiratory systems: Secondary | ICD-10-CM

## 2014-06-23 DIAGNOSIS — R0609 Other forms of dyspnea: Secondary | ICD-10-CM

## 2014-06-23 NOTE — Progress Notes (Signed)
Exercise Treadmill Test  Rickey Hurley is a 48 y.o. male non-smoker with a hx of pericarditis 20 years ago, HTN, HL referred by PCP for ETT 2/2 DOE. No CP, syncope.  FHx:  Adopted. Exam ok.  ECG normal.  Pre-Exercise Testing Evaluation Rhythm: normal sinus  Rate: 72 bpm     Test  Exercise Tolerance Test Ordering MD: Peter SwazilandJordan, MD  Interpreting MD: Tereso NewcomerScott Weaver, PA-C  Unique Test No: 1  Treadmill:  1  Indication for ETT: exertional dyspnea  Contraindication to ETT: No   Stress Modality: exercise - treadmill  Cardiac Imaging Performed: non   Protocol: standard Bruce - maximal  Max BP:  209/56  Max MPHR (bpm):  173 85% MPR (bpm):  147  MPHR obtained (bpm):  160 % MPHR obtained:  92  Reached 85% MPHR (min:sec):  5:42 Total Exercise Time (min-sec):  7:00  Workload in METS:  8.4 Borg Scale: 15  Reason ETT Terminated:  desired heart rate attained    ST Segment Analysis At Rest: normal ST segments - no evidence of significant ST depression With Exercise: no evidence of significant ST depression  Other Information Arrhythmia:  No Angina during ETT:  absent (0) Quality of ETT:  diagnostic  ETT Interpretation:  normal - no evidence of ischemia by ST analysis  Comments: Good exercise capacity. No chest pain.  No significant dyspnea. Normal BP and HR response to exercise. No ST changes to suggest ischemia.   Recommendations: F/u with PCP as directed. Signed,  Tereso NewcomerScott Weaver, PA-C   06/23/2014 11:11 AM

## 2014-12-18 ENCOUNTER — Ambulatory Visit (INDEPENDENT_AMBULATORY_CARE_PROVIDER_SITE_OTHER): Payer: BC Managed Care – PPO | Admitting: Internal Medicine

## 2014-12-18 ENCOUNTER — Encounter: Payer: Self-pay | Admitting: Internal Medicine

## 2014-12-18 VITALS — BP 122/80 | HR 66 | Temp 98.7°F | Resp 16 | Ht 71.0 in | Wt 299.8 lb

## 2014-12-18 DIAGNOSIS — J209 Acute bronchitis, unspecified: Secondary | ICD-10-CM | POA: Insufficient documentation

## 2014-12-18 DIAGNOSIS — J202 Acute bronchitis due to streptococcus: Secondary | ICD-10-CM

## 2014-12-18 DIAGNOSIS — I10 Essential (primary) hypertension: Secondary | ICD-10-CM

## 2014-12-18 MED ORDER — HYDROCODONE-HOMATROPINE 5-1.5 MG/5ML PO SYRP: 5.0000 mL | ORAL_SOLUTION | Freq: Three times a day (TID) | ORAL | Status: DC | PRN

## 2014-12-18 MED ORDER — AMOXICILLIN 875 MG PO TABS: 875.0000 mg | ORAL_TABLET | Freq: Two times a day (BID) | ORAL | Status: DC

## 2014-12-18 NOTE — Progress Notes (Signed)
Pre visit review using our clinic review tool, if applicable. No additional management support is needed unless otherwise documented below in the visit note. 

## 2014-12-18 NOTE — Patient Instructions (Signed)

## 2014-12-19 ENCOUNTER — Other Ambulatory Visit: Payer: Self-pay | Admitting: Internal Medicine

## 2014-12-19 ENCOUNTER — Encounter: Payer: Self-pay | Admitting: Internal Medicine

## 2014-12-19 DIAGNOSIS — J209 Acute bronchitis, unspecified: Secondary | ICD-10-CM

## 2014-12-19 MED ORDER — METHYLPREDNISOLONE 4 MG PO KIT: PACK | ORAL | Status: DC

## 2014-12-19 NOTE — Assessment & Plan Note (Signed)
His BP is well controlled 

## 2014-12-19 NOTE — Assessment & Plan Note (Signed)
I will treat the infection with amoxil and will control the cough with hycodan

## 2014-12-19 NOTE — Progress Notes (Signed)
Subjective:    Patient ID: Rickey Hurley, male    DOB: 25-Aug-1966, 49 y.o.   MRN: [id number]  Cough This is a new problem. The current episode started in the past 7 days. The problem has been unchanged. The problem occurs every few hours. The cough is productive of purulent sputum. Associated symptoms include chills and a sore throat. Pertinent negatives include no chest pain, ear congestion, ear pain, fever, headaches, heartburn, hemoptysis, myalgias, nasal congestion, postnasal drip, rash, rhinorrhea, shortness of breath, sweats, weight loss or wheezing. He has tried OTC cough suppressant for the symptoms. The treatment provided mild relief. There is no history of asthma, bronchiectasis, bronchitis, COPD, emphysema, environmental allergies or pneumonia.      Review of Systems  Constitutional: Positive for chills. Negative for fever, weight loss, diaphoresis and fatigue.  HENT: Positive for sore throat. Negative for ear pain, postnasal drip, rhinorrhea, sinus pressure and trouble swallowing.   Eyes: Negative.   Respiratory: Positive for cough. Negative for hemoptysis, choking, chest tightness, shortness of breath, wheezing and stridor.   Cardiovascular: Negative.  Negative for chest pain, palpitations and leg swelling.  Gastrointestinal: Negative.  Negative for heartburn, nausea, vomiting, abdominal pain, diarrhea, constipation and blood in stool.  Endocrine: Negative.   Genitourinary: Negative.   Musculoskeletal: Negative.  Negative for myalgias.  Skin: Negative.  Negative for rash.  Allergic/Immunologic: Negative.  Negative for environmental allergies.  Neurological: Negative.  Negative for headaches.  Hematological: Negative.  Negative for adenopathy. Does not bruise/bleed easily.  Psychiatric/Behavioral: Negative.        Objective:   Physical Exam  Constitutional: He is oriented to person, place, and time. He appears well-developed and well-nourished.  Non-toxic appearance. He does  not have a sickly appearance. He does not appear ill. No distress.  HENT:  Head: Normocephalic and atraumatic.  Right Ear: Hearing, tympanic membrane, external ear and ear canal normal.  Left Ear: Hearing, tympanic membrane, external ear and ear canal normal.  Mouth/Throat: Mucous membranes are normal. Mucous membranes are not pale, not dry and not cyanotic. Posterior oropharyngeal erythema present. No oropharyngeal exudate, posterior oropharyngeal edema or tonsillar abscesses.  Eyes: Conjunctivae are normal. Right eye exhibits no discharge. Left eye exhibits no discharge. No scleral icterus.  Neck: Normal range of motion. Neck supple. No JVD present. No tracheal deviation present. No thyromegaly present.  Cardiovascular: Normal rate, regular rhythm, normal heart sounds and intact distal pulses.  Exam reveals no gallop and no friction rub.   No murmur heard. Pulmonary/Chest: Effort normal and breath sounds normal. No stridor. No respiratory distress. He has no wheezes. He has no rales. He exhibits no tenderness.  Abdominal: Soft. Bowel sounds are normal. He exhibits no distension and no mass. There is no tenderness. There is no rebound and no guarding.  Musculoskeletal: Normal range of motion. He exhibits no edema or tenderness.  Lymphadenopathy:    He has no cervical adenopathy.  Neurological: He is oriented to person, place, and time.  Skin: Skin is warm and dry. No rash noted. He is not diaphoretic. No erythema. No pallor.  Vitals reviewed.   Lab Results  Component Value Date   WBC 6.7 05/19/2014   HGB 13.4 05/19/2014   HCT 38.7* 05/19/2014   PLT 274.0 05/19/2014   GLUCOSE 92 05/02/2014   CHOL 168 01/30/2014   TRIG 154.0* 01/30/2014   HDL 40.40 01/30/2014   LDLDIRECT 100.8 08/21/2011   LDLCALC 97 01/30/2014   ALT 59* 01/30/2014   AST 29 01/30/2014  NA 138 05/02/2014   K 4.2 05/02/2014   CL 107 05/02/2014   CREATININE 0.9 05/02/2014   BUN 19 05/02/2014   CO2 23 05/02/2014    TSH 2.55 01/30/2014   PSA 0.43 08/21/2011   HGBA1C 5.8 05/02/2014        Assessment & Plan:

## 2015-01-15 ENCOUNTER — Ambulatory Visit (INDEPENDENT_AMBULATORY_CARE_PROVIDER_SITE_OTHER): Payer: BC Managed Care – PPO | Admitting: Internal Medicine

## 2015-01-15 ENCOUNTER — Encounter: Payer: Self-pay | Admitting: Internal Medicine

## 2015-01-15 VITALS — BP 138/92 | HR 69 | Temp 98.2°F | Resp 16 | Ht 71.0 in | Wt 301.0 lb

## 2015-01-15 DIAGNOSIS — R058 Other specified cough: Secondary | ICD-10-CM

## 2015-01-15 DIAGNOSIS — R05 Cough: Secondary | ICD-10-CM

## 2015-01-15 DIAGNOSIS — E785 Hyperlipidemia, unspecified: Secondary | ICD-10-CM

## 2015-01-15 DIAGNOSIS — I491 Atrial premature depolarization: Secondary | ICD-10-CM

## 2015-01-15 DIAGNOSIS — I10 Essential (primary) hypertension: Secondary | ICD-10-CM

## 2015-01-15 DIAGNOSIS — M1A09X0 Idiopathic chronic gout, multiple sites, without tophus (tophi): Secondary | ICD-10-CM

## 2015-01-15 DIAGNOSIS — T464X5A Adverse effect of angiotensin-converting-enzyme inhibitors, initial encounter: Secondary | ICD-10-CM | POA: Insufficient documentation

## 2015-01-15 MED ORDER — METOPROLOL TARTRATE 25 MG PO TABS: ORAL_TABLET | ORAL | Status: DC

## 2015-01-15 MED ORDER — HYDROCODONE-HOMATROPINE 5-1.5 MG/5ML PO SYRP: 5.0000 mL | ORAL_SOLUTION | Freq: Three times a day (TID) | ORAL | Status: DC | PRN

## 2015-01-15 MED ORDER — SIMVASTATIN 40 MG PO TABS: ORAL_TABLET | ORAL | Status: DC

## 2015-01-15 MED ORDER — ALLOPURINOL 300 MG PO TABS: ORAL_TABLET | ORAL | Status: DC

## 2015-01-15 MED ORDER — FENOFIBRATE 160 MG PO TABS: ORAL_TABLET | ORAL | Status: DC

## 2015-01-15 NOTE — Progress Notes (Signed)
Pre visit review using our clinic review tool, if applicable. No additional management support is needed unless otherwise documented below in the visit note. 

## 2015-01-15 NOTE — Patient Instructions (Signed)
Cough, Adult  A cough is a reflex that helps clear your throat and airways. It can help heal the body or may be a reaction to an irritated airway. A cough may only last 2 or 3 weeks (acute) or may last more than 8 weeks (chronic).  CAUSES Acute cough:  Viral or bacterial infections. Chronic cough:  Infections.  Allergies.  Asthma.  Post-nasal drip.  Smoking.  Heartburn or acid reflux.  Some medicines.  Chronic lung problems (COPD).  Cancer. SYMPTOMS   Cough.  Fever.  Chest pain.  Increased breathing rate.  High-pitched whistling sound when breathing (wheezing).  Colored mucus that you cough up (sputum). TREATMENT   A bacterial cough may be treated with antibiotic medicine.  A viral cough must run its course and will not respond to antibiotics.  Your caregiver may recommend other treatments if you have a chronic cough. HOME CARE INSTRUCTIONS   Only take over-the-counter or prescription medicines for pain, discomfort, or fever as directed by your caregiver. Use cough suppressants only as directed by your caregiver.  Use a cold steam vaporizer or humidifier in your bedroom or home to help loosen secretions.  Sleep in a semi-upright position if your cough is worse at night.  Rest as needed.  Stop smoking if you smoke. SEEK IMMEDIATE MEDICAL CARE IF:   You have pus in your sputum.  Your cough starts to worsen.  You cannot control your cough with suppressants and are losing sleep.  You begin coughing up blood.  You have difficulty breathing.  You develop pain which is getting worse or is uncontrolled with medicine.  You have a fever. MAKE SURE YOU:   Understand these instructions.  Will watch your condition.  Will get help right away if you are not doing well or get worse. Document Released: 05/09/2011 Document Revised: 02/02/2012 Document Reviewed: 05/09/2011 ExitCare Patient Information 2015 [employer]. This information is not intended  to replace advice given to you by your health care provider. Make sure you discuss any questions you have with your health care provider.  

## 2015-01-15 NOTE — Assessment & Plan Note (Signed)
His BP is not very well controlled but die to the cough he will have to stop the ACEI He will work on his lifestyle modifications Will recheck his BP in 1-2 months and will treat if needed

## 2015-01-15 NOTE — Assessment & Plan Note (Signed)
Will stop the ACEI Will cont hycodan as needed for the cough

## 2015-01-15 NOTE — Progress Notes (Signed)
Subjective:    Patient ID: Rickey Hurley, male    DOB: 1966-02-22, 49 y.o.   MRN: [id number]  Cough This is a recurrent problem. The current episode started 1 to 4 weeks ago. The problem has been unchanged. The cough is non-productive. Pertinent negatives include no chest pain, chills, ear congestion, ear pain, fever, headaches, heartburn, hemoptysis, myalgias, nasal congestion, postnasal drip, rash, rhinorrhea, sore throat, shortness of breath, sweats, weight loss or wheezing. He has tried prescription cough suppressant for the symptoms. The treatment provided mild relief.      Review of Systems  Constitutional: Negative.  Negative for fever, chills, weight loss, diaphoresis, appetite change and fatigue.  HENT: Negative.  Negative for congestion, ear pain, postnasal drip, rhinorrhea, sinus pressure, sore throat, trouble swallowing and voice change.   Eyes: Negative.   Respiratory: Positive for cough. Negative for hemoptysis, choking, chest tightness, shortness of breath, wheezing and stridor.   Cardiovascular: Negative.  Negative for chest pain, palpitations and leg swelling.  Gastrointestinal: Negative.  Negative for heartburn, nausea, vomiting, abdominal pain, diarrhea, constipation and blood in stool.  Endocrine: Negative.   Genitourinary: Negative.   Musculoskeletal: Negative.  Negative for myalgias, back pain and arthralgias.  Skin: Negative.  Negative for rash.  Allergic/Immunologic: Negative.   Neurological: Negative.  Negative for headaches.  Hematological: Negative.  Negative for adenopathy. Does not bruise/bleed easily.  Psychiatric/Behavioral: Negative.        Objective:   Physical Exam  Constitutional: He is oriented to person, place, and time. He appears well-developed and well-nourished.  Non-toxic appearance. He does not have a sickly appearance. He does not appear ill. No distress.  HENT:  Head: Normocephalic and atraumatic.  Mouth/Throat: Oropharynx is clear and  moist. No oropharyngeal exudate.  Eyes: Conjunctivae are normal. Right eye exhibits no discharge. Left eye exhibits no discharge. No scleral icterus.  Neck: Normal range of motion. Neck supple. No JVD present. No tracheal deviation present. No thyromegaly present.  Cardiovascular: Normal rate, regular rhythm, normal heart sounds and intact distal pulses.  Exam reveals no gallop and no friction rub.   No murmur heard. Pulmonary/Chest: Effort normal and breath sounds normal. No accessory muscle usage or stridor. No tachypnea. No respiratory distress. He has no decreased breath sounds. He has no wheezes. He has no rhonchi. He has no rales. He exhibits no tenderness.  Abdominal: Soft. Bowel sounds are normal. He exhibits no distension and no mass. There is no tenderness. There is no rebound and no guarding.  Musculoskeletal: Normal range of motion. He exhibits no edema or tenderness.  Lymphadenopathy:    He has no cervical adenopathy.  Neurological: He is oriented to person, place, and time.  Skin: Skin is warm and dry. No rash noted. He is not diaphoretic. No erythema. No pallor.  Psychiatric: He has a normal mood and affect. His behavior is normal. Judgment and thought content normal.  Vitals reviewed.    Lab Results  Component Value Date   WBC 6.7 05/19/2014   HGB 13.4 05/19/2014   HCT 38.7* 05/19/2014   PLT 274.0 05/19/2014   GLUCOSE 92 05/02/2014   CHOL 168 01/30/2014   TRIG 154.0* 01/30/2014   HDL 40.40 01/30/2014   LDLDIRECT 100.8 08/21/2011   LDLCALC 97 01/30/2014   ALT 59* 01/30/2014   AST 29 01/30/2014   NA 138 05/02/2014   K 4.2 05/02/2014   CL 107 05/02/2014   CREATININE 0.9 05/02/2014   BUN 19 05/02/2014   CO2 23 05/02/2014  TSH 2.55 01/30/2014   PSA 0.43 08/21/2011   HGBA1C 5.8 05/02/2014       Assessment & Plan:

## 2015-07-06 ENCOUNTER — Other Ambulatory Visit: Payer: Self-pay | Admitting: Internal Medicine

## 2015-08-22 ENCOUNTER — Encounter: Payer: Self-pay | Admitting: Internal Medicine

## 2015-08-22 ENCOUNTER — Ambulatory Visit (INDEPENDENT_AMBULATORY_CARE_PROVIDER_SITE_OTHER): Payer: BC Managed Care – PPO | Admitting: Internal Medicine

## 2015-08-22 ENCOUNTER — Ambulatory Visit (HOSPITAL_COMMUNITY)
Admission: RE | Admit: 2015-08-22 | Discharge: 2015-08-22 | Disposition: A | Discharge status: Home / Self Care | Payer: BC Managed Care – PPO | Source: Ambulatory Visit | Attending: Internal Medicine | Admitting: Internal Medicine

## 2015-08-22 VITALS — BP 118/80 | HR 60 | Temp 98.4°F | Resp 16 | Ht 71.0 in | Wt 313.0 lb

## 2015-08-22 DIAGNOSIS — M5416 Radiculopathy, lumbar region: Secondary | ICD-10-CM | POA: Insufficient documentation

## 2015-08-22 MED ORDER — TIZANIDINE HCL 4 MG PO TABS: 4.0000 mg | ORAL_TABLET | Freq: Three times a day (TID) | ORAL | Status: DC | PRN

## 2015-08-22 NOTE — Patient Instructions (Signed)
Back Pain, Adult Low back pain is very common. About 1 in 5 people have back pain.The cause of low back pain is rarely dangerous. The pain often gets better over time.About half of people with a sudden onset of back pain feel better in just 2 weeks. About 8 in 10 people feel better by 6 weeks.  CAUSES Some common causes of back pain include:  Strain of the muscles or ligaments supporting the spine.  Wear and tear (degeneration) of the spinal discs.  Arthritis.  Direct injury to the back. DIAGNOSIS Most of the time, the direct cause of low back pain is not known.However, back pain can be treated effectively even when the exact cause of the pain is unknown.Answering your caregiver's questions about your overall health and symptoms is one of the most accurate ways to make sure the cause of your pain is not dangerous. If your caregiver needs more information, he or she may order lab work or imaging tests (X-rays or MRIs).However, even if imaging tests show changes in your back, this usually does not require surgery. HOME CARE INSTRUCTIONS For many people, back pain returns.Since low back pain is rarely dangerous, it is often a condition that people can learn to manageon their own.   Remain active. It is stressful on the back to sit or stand in one place. Do not sit, drive, or stand in one place for more than 30 minutes at a time. Take short walks on level surfaces as soon as pain allows.Try to increase the length of time you walk each day.  Do not stay in bed.Resting more than 1 or 2 days can delay your recovery.  Do not avoid exercise or work.Your body is made to move.It is not dangerous to be active, even though your back may hurt.Your back will likely heal faster if you return to being active before your pain is gone.  Pay attention to your body when you bend and lift. Many people have less discomfortwhen lifting if they bend their knees, keep the load close to their bodies,and  avoid twisting. Often, the most comfortable positions are those that put less stress on your recovering back.  Find a comfortable position to sleep. Use a firm mattress and lie on your side with your knees slightly bent. If you lie on your back, put a pillow under your knees.  Only take over-the-counter or prescription medicines as directed by your caregiver. Over-the-counter medicines to reduce pain and inflammation are often the most helpful.Your caregiver may prescribe muscle relaxant drugs.These medicines help dull your pain so you can more quickly return to your normal activities and healthy exercise.  Put ice on the injured area.  Put ice in a plastic bag.  Place a towel between your skin and the bag.  Leave the ice on for 15-20 minutes, 03-04 times a day for the first 2 to 3 days. After that, ice and heat may be alternated to reduce pain and spasms.  Ask your caregiver about trying back exercises and gentle massage. This may be of some benefit.  Avoid feeling anxious or stressed.Stress increases muscle tension and can worsen back pain.It is important to recognize when you are anxious or stressed and learn ways to manage it.Exercise is a great option. SEEK MEDICAL CARE IF:  You have pain that is not relieved with rest or medicine.  You have pain that does not improve in 1 week.  You have new symptoms.  You are generally not feeling well. SEEK   IMMEDIATE MEDICAL CARE IF:   You have pain that radiates from your back into your legs.  You develop new bowel or bladder control problems.  You have unusual weakness or numbness in your arms or legs.  You develop nausea or vomiting.  You develop abdominal pain.  You feel faint. Document Released: 11/10/2005 Document Revised: 05/11/2012 Document Reviewed: 03/14/2014 [employer] Patient Information 2015 [employer]. This information is not intended to replace advice given to you by your health care provider. Make sure you  discuss any questions you have with your health care provider.  

## 2015-08-22 NOTE — Progress Notes (Signed)
Pre visit review using our clinic review tool, if applicable. No additional management support is needed unless otherwise documented below in the visit note. 

## 2015-08-23 NOTE — Progress Notes (Signed)
Subjective:  Patient ID: Rickey Hurley, male    DOB: 1966-07-05  Age: 49 y.o. MRN: [id number]  CC: Back Pain   HPI Rickey Hurley presents for five-week history of worsening left lower back pain, left hip pain and radiation into his left leg with left leg weakness. He denies any specific trauma or injury. He has been taking 10 mg of prednisone a day and an anti-inflammatory with only minimal relief from the symptoms. He describes the pain as a constant dull ache that becomes sharp and stabbing with certain movements, positions and activities. He has not had any bowel or bladder incontinence or retention.  Outpatient Prescriptions Prior to Visit  Medication Sig Dispense Refill  . allopurinol (ZYLOPRIM) 300 MG tablet TAKE 1 TABLET DAILY 90 tablet 3  . aspirin 81 MG tablet Take 81 mg by mouth daily.      . diclofenac (VOLTAREN) 75 MG EC tablet TAKE 1 TABLET TWICE A DAY 180 tablet 1  . fenofibrate 160 MG tablet TAKE 1 TABLET DAILY 90 tablet 3  . metoprolol tartrate (LOPRESSOR) 25 MG tablet TAKE 1 TABLET TWICE A DAY 180 tablet 3  . Multiple Vitamin (MULTIVITAMIN) tablet Take 1 tablet by mouth daily.      . Omega-3 Fatty Acids (FISH OIL) 1000 MG CAPS Take by mouth daily.      . simvastatin (ZOCOR) 40 MG tablet TAKE 1 TABLET AT BEDTIME 90 tablet 3  . Sulfacetamide Sodium-Sulfur 10-5 % CREA Apply 1 application topically 2 (two) times daily as needed. 120 g 3  . HYDROcodone-homatropine (HYCODAN) 5-1.5 MG/5ML syrup Take 5 mLs by mouth every 8 (eight) hours as needed for cough. 240 mL 0   No facility-administered medications prior to visit.    ROS Review of Systems  Constitutional: Negative for fever, chills, diaphoresis, appetite change and fatigue.  HENT: Negative.   Eyes: Negative.   Respiratory: Negative.  Negative for cough, choking, chest tightness, shortness of breath and stridor.   Cardiovascular: Negative.  Negative for chest pain, palpitations and leg swelling.  Gastrointestinal: Negative.   Negative for nausea, vomiting, abdominal pain, diarrhea, constipation and blood in stool.  Endocrine: Negative.   Genitourinary: Negative.   Musculoskeletal: Positive for back pain and arthralgias. Negative for myalgias, joint swelling and neck stiffness.  Skin: Negative.   Allergic/Immunologic: Negative.   Neurological: Positive for weakness. Negative for dizziness, light-headedness, numbness and headaches.  Hematological: Negative.  Negative for adenopathy. Does not bruise/bleed easily.  Psychiatric/Behavioral: Negative.     Objective:  BP 118/80 mmHg  Pulse 60  Temp(Src) 98.4 F (36.9 C) (Oral)  Resp 16  Ht  (1.803 m)  Wt 313 lb (141.976 kg)  BMI 43.67 kg/m2  SpO2 97%  BP Readings from Last 3 Encounters:  08/22/15 118/80  01/15/15 138/92  12/18/14 122/80    Wt Readings from Last 3 Encounters:  08/22/15 313 lb (141.976 kg)  01/15/15 301 lb (136.533 kg)  12/18/14 299 lb 12 oz (135.966 kg)    Physical Exam  Constitutional: He is oriented to person, place, and time. No distress.  HENT:  Head: Normocephalic and atraumatic.  Mouth/Throat: Oropharynx is clear and moist. No oropharyngeal exudate.  Eyes: Conjunctivae are normal. Right eye exhibits no discharge. Left eye exhibits no discharge. No scleral icterus.  Neck: Normal range of motion. Neck supple. No JVD present. No tracheal deviation present. No thyromegaly present.  Cardiovascular: Normal rate, regular rhythm, normal heart sounds and intact distal pulses.  Exam reveals no gallop  and no friction rub.   No murmur heard. Pulmonary/Chest: Effort normal and breath sounds normal. No stridor. No respiratory distress. He has no wheezes. He has no rales. He exhibits no tenderness.  Abdominal: Soft. Bowel sounds are normal. He exhibits no distension and no mass. There is no tenderness. There is no rebound and no guarding.  Musculoskeletal: Normal range of motion. He exhibits no edema or tenderness.       Left hip:  Normal. He exhibits normal range of motion, normal strength, no tenderness, no bony tenderness, no swelling, no crepitus, no deformity and no laceration.       Lumbar back: Normal. He exhibits normal range of motion, no tenderness, no bony tenderness, no swelling, no edema, no deformity, no laceration, no pain, no spasm and normal pulse.  Lymphadenopathy:    He has no cervical adenopathy.  Neurological: He is alert and oriented to person, place, and time. He has normal strength. He displays abnormal reflex. He displays no atrophy and no tremor. No cranial nerve deficit or sensory deficit. He exhibits normal muscle tone. He displays a negative Romberg sign. He displays no seizure activity. Coordination and gait normal.  Reflex Scores:      Tricep reflexes are 1+ on the right side and 1+ on the left side.      Bicep reflexes are 1+ on the right side and 1+ on the left side.      Brachioradialis reflexes are 1+ on the right side and 1+ on the left side.      Patellar reflexes are 2+ on the right side and 1+ on the left side.      Achilles reflexes are 0 on the right side and 0 on the left side. +SLR in LLE -SLR in RLE  Skin: Skin is warm and dry. No rash noted. He is not diaphoretic. No erythema. No pallor.  Psychiatric: He has a normal mood and affect. His behavior is normal. Judgment and thought content normal.  Vitals reviewed.   Lab Results  Component Value Date   WBC 6.7 05/19/2014   HGB 13.4 05/19/2014   HCT 38.7* 05/19/2014   PLT 274.0 05/19/2014   GLUCOSE 92 05/02/2014   CHOL 168 01/30/2014   TRIG 154.0* 01/30/2014   HDL 40.40 01/30/2014   LDLDIRECT 100.8 08/21/2011   LDLCALC 97 01/30/2014   ALT 59* 01/30/2014   AST 29 01/30/2014   NA 138 05/02/2014   K 4.2 05/02/2014   CL 107 05/02/2014   CREATININE 0.9 05/02/2014   BUN 19 05/02/2014   CO2 23 05/02/2014   TSH 2.55 01/30/2014   PSA 0.43 08/21/2011   HGBA1C 5.8 05/02/2014    Dg Lumbar Spine Complete  08/22/2015    CLINICAL DATA:  Left pelvic and back pain, initial encounter  EXAM: LUMBAR SPINE - COMPLETE 4+ VIEW  COMPARISON:  None.  FINDINGS: Four lumbar type vertebral bodies are well visualized. Vertebral body height is well maintained. No pars defects are seen. Mild osteophytic changes are noted.  IMPRESSION: Mild degenerative change without acute abnormality.   Electronically Signed   By: Alcide Clever M.D.   On: 08/22/2015 12:49   Dg Hip Unilat With Pelvis 2-3 Views Left  08/22/2015   CLINICAL DATA:  Left pelvic pain for 5 weeks, no known injury, initial encounter  EXAM: DG HIP (WITH OR WITHOUT PELVIS) 2-3V LEFT  COMPARISON:  None.  FINDINGS: Pelvic ring is intact. No acute fracture or dislocation is seen. No soft tissue abnormality  is noted.  IMPRESSION: No acute abnormality noted.   Electronically Signed   By: Alcide Clever M.D.   On: 08/22/2015 12:48    Assessment & Plan:   [place] was seen today for back pain.  Diagnoses and all orders for this visit:  Left lumbar radiculitis- he has a decreased reflex in the left patellar region, the plain films of his lumbar spine are abnormal. I'm concerned that he may have some pathology in his lumbosacral spine such as disc herniation, nerve impingement, mass or tumor. His pain may also be related to sciatica. X-rays of his hip are normal so I think the pain there is radiating from the left lower back or sciatic region. I've asked him to stop taking prednisone. He will try a muscle relaxer and continue the anti-inflammatory for symptom relief. He will start physical therapy. I have ordered an MRI of his lumbar spine to screen for additional pathology. May consider surgical options if indicated based on the MRI results. -     DG HIP UNILAT WITH PELVIS 2-3 VIEWS LEFT; Future -     DG Lumbar Spine Complete; Future -     tiZANidine (ZANAFLEX) 4 MG tablet; Take 1 tablet (4 mg total) by mouth every 8 (eight) hours as needed for muscle spasms. -     Ambulatory referral to  Physical Therapy -     MR Lumbar Spine Wo Contrast; Future   I have discontinued Mr. Futch HYDROcodone-homatropine. I am also having him start on tiZANidine. Additionally, I am having him maintain his aspirin, multivitamin, Fish Oil, Sulfacetamide Sodium-Sulfur, allopurinol, fenofibrate, metoprolol tartrate, simvastatin, and diclofenac.  Meds ordered this encounter  Medications  . tiZANidine (ZANAFLEX) 4 MG tablet    Sig: Take 1 tablet (4 mg total) by mouth every 8 (eight) hours as needed for muscle spasms.    Dispense:  90 tablet    Refill:  2     Follow-up: Return in about 6 weeks (around 10/03/2015).  Sanda Linger, MD

## 2015-08-29 ENCOUNTER — Encounter: Payer: Self-pay | Admitting: Internal Medicine

## 2015-09-05 ENCOUNTER — Other Ambulatory Visit: Payer: Self-pay | Admitting: Internal Medicine

## 2015-09-05 DIAGNOSIS — M5416 Radiculopathy, lumbar region: Secondary | ICD-10-CM

## 2015-09-05 MED ORDER — CYCLOBENZAPRINE HCL ER 15 MG PO CP24: 15.0000 mg | ORAL_CAPSULE | Freq: Every day | ORAL | Status: DC | PRN

## 2015-09-11 ENCOUNTER — Ambulatory Visit
Admission: RE | Admit: 2015-09-11 | Discharge: 2015-09-11 | Disposition: A | Discharge status: Home / Self Care | Payer: BC Managed Care – PPO | Source: Ambulatory Visit | Attending: Internal Medicine | Admitting: Internal Medicine

## 2015-09-11 ENCOUNTER — Encounter: Payer: Self-pay | Admitting: Internal Medicine

## 2015-09-11 DIAGNOSIS — M5416 Radiculopathy, lumbar region: Secondary | ICD-10-CM

## 2015-09-13 ENCOUNTER — Other Ambulatory Visit: Payer: Self-pay | Admitting: Internal Medicine

## 2015-09-13 MED ORDER — DIAZEPAM 5 MG PO TABS: 5.0000 mg | ORAL_TABLET | Freq: Two times a day (BID) | ORAL | Status: DC | PRN

## 2015-09-26 ENCOUNTER — Other Ambulatory Visit: Payer: Self-pay | Admitting: Internal Medicine

## 2015-09-26 DIAGNOSIS — G8929 Other chronic pain: Secondary | ICD-10-CM

## 2015-09-26 DIAGNOSIS — M25552 Pain in left hip: Principal | ICD-10-CM

## 2015-11-30 ENCOUNTER — Other Ambulatory Visit: Payer: Self-pay

## 2015-11-30 DIAGNOSIS — I10 Essential (primary) hypertension: Secondary | ICD-10-CM

## 2015-11-30 DIAGNOSIS — E785 Hyperlipidemia, unspecified: Secondary | ICD-10-CM

## 2015-11-30 DIAGNOSIS — M1A09X0 Idiopathic chronic gout, multiple sites, without tophus (tophi): Secondary | ICD-10-CM

## 2015-11-30 DIAGNOSIS — I491 Atrial premature depolarization: Secondary | ICD-10-CM

## 2015-11-30 MED ORDER — DICLOFENAC SODIUM 75 MG PO TBEC: 75.0000 mg | DELAYED_RELEASE_TABLET | Freq: Two times a day (BID) | ORAL | Status: DC

## 2015-11-30 MED ORDER — METOPROLOL TARTRATE 25 MG PO TABS: ORAL_TABLET | ORAL | Status: DC

## 2015-11-30 MED ORDER — FENOFIBRATE 160 MG PO TABS: ORAL_TABLET | ORAL | Status: DC

## 2015-11-30 MED ORDER — SIMVASTATIN 40 MG PO TABS: ORAL_TABLET | ORAL | Status: DC

## 2015-11-30 MED ORDER — ALLOPURINOL 300 MG PO TABS: ORAL_TABLET | ORAL | Status: DC

## 2016-04-28 ENCOUNTER — Encounter: Payer: Self-pay | Admitting: Internal Medicine

## 2016-05-17 ENCOUNTER — Other Ambulatory Visit: Payer: Self-pay | Admitting: Internal Medicine

## 2016-06-18 ENCOUNTER — Encounter: Payer: Self-pay | Admitting: Internal Medicine

## 2016-06-18 ENCOUNTER — Other Ambulatory Visit (INDEPENDENT_AMBULATORY_CARE_PROVIDER_SITE_OTHER): Payer: BC Managed Care – PPO

## 2016-06-18 ENCOUNTER — Ambulatory Visit (INDEPENDENT_AMBULATORY_CARE_PROVIDER_SITE_OTHER): Payer: BC Managed Care – PPO | Admitting: Internal Medicine

## 2016-06-18 VITALS — BP 124/82 | HR 62 | Temp 98.3°F | Resp 16 | Ht 71.0 in | Wt 316.2 lb

## 2016-06-18 DIAGNOSIS — M1000 Idiopathic gout, unspecified site: Secondary | ICD-10-CM

## 2016-06-18 DIAGNOSIS — R7989 Other specified abnormal findings of blood chemistry: Secondary | ICD-10-CM

## 2016-06-18 DIAGNOSIS — Z0000 Encounter for general adult medical examination without abnormal findings: Secondary | ICD-10-CM

## 2016-06-18 DIAGNOSIS — H6981 Other specified disorders of Eustachian tube, right ear: Secondary | ICD-10-CM

## 2016-06-18 DIAGNOSIS — R739 Hyperglycemia, unspecified: Secondary | ICD-10-CM

## 2016-06-18 DIAGNOSIS — J4520 Mild intermittent asthma, uncomplicated: Secondary | ICD-10-CM | POA: Diagnosis not present

## 2016-06-18 DIAGNOSIS — I10 Essential (primary) hypertension: Secondary | ICD-10-CM

## 2016-06-18 DIAGNOSIS — J302 Other seasonal allergic rhinitis: Secondary | ICD-10-CM | POA: Diagnosis not present

## 2016-06-18 DIAGNOSIS — Z23 Encounter for immunization: Secondary | ICD-10-CM | POA: Diagnosis not present

## 2016-06-18 DIAGNOSIS — R7303 Prediabetes: Secondary | ICD-10-CM | POA: Insufficient documentation

## 2016-06-18 LAB — COMPREHENSIVE METABOLIC PANEL WITH GFR
ALT: 52 U/L (ref 0–53)
AST: 25 U/L (ref 0–37)
Albumin: 4.4 g/dL (ref 3.5–5.2)
Alkaline Phosphatase: 53 U/L (ref 39–117)
BUN: 17 mg/dL (ref 6–23)
CO2: 27 meq/L (ref 19–32)
Calcium: 9.7 mg/dL (ref 8.4–10.5)
Chloride: 108 meq/L (ref 96–112)
Creatinine, Ser: 0.98 mg/dL (ref 0.40–1.50)
GFR: 86.27 mL/min
Glucose, Bld: 137 mg/dL — ABNORMAL HIGH (ref 70–99)
Potassium: 4.4 meq/L (ref 3.5–5.1)
Sodium: 142 meq/L (ref 135–145)
Total Bilirubin: 0.4 mg/dL (ref 0.2–1.2)
Total Protein: 6.7 g/dL (ref 6.0–8.3)

## 2016-06-18 LAB — CBC WITH DIFFERENTIAL/PLATELET
BASOS PCT: 0.7 % (ref 0.0–3.0)
Basophils Absolute: 0 10*3/uL (ref 0.0–0.1)
Eosinophils Absolute: 0.1 10*3/uL (ref 0.0–0.7)
Eosinophils Relative: 2.1 % (ref 0.0–5.0)
HEMATOCRIT: 38.9 % — AB (ref 39.0–52.0)
HEMOGLOBIN: 13.2 g/dL (ref 13.0–17.0)
LYMPHS PCT: 31.1 % (ref 12.0–46.0)
Lymphs Abs: 2 10*3/uL (ref 0.7–4.0)
MCHC: 33.9 g/dL (ref 30.0–36.0)
MCV: 87.8 fl (ref 78.0–100.0)
MONOS PCT: 7.5 % (ref 3.0–12.0)
Monocytes Absolute: 0.5 10*3/uL (ref 0.1–1.0)
NEUTROS ABS: 3.8 10*3/uL (ref 1.4–7.7)
Neutrophils Relative %: 58.6 % (ref 43.0–77.0)
PLATELETS: 299 10*3/uL (ref 150.0–400.0)
RBC: 4.43 Mil/uL (ref 4.22–5.81)
RDW: 13.2 % (ref 11.5–15.5)
WBC: 6.6 10*3/uL (ref 4.0–10.5)

## 2016-06-18 LAB — URINALYSIS, ROUTINE W REFLEX MICROSCOPIC
HGB URINE DIPSTICK: NEGATIVE
KETONES UR: NEGATIVE
LEUKOCYTES UA: NEGATIVE
NITRITE: NEGATIVE
PH: 5.5 (ref 5.0–8.0)
Specific Gravity, Urine: >=1.03 — AB (ref 1.000–1.030)
Urine Glucose: NEGATIVE
Urobilinogen, UA: 0.2 (ref 0.0–1.0)

## 2016-06-18 LAB — HEMOGLOBIN A1C: HEMOGLOBIN A1C: 6.2 % (ref 4.6–6.5)

## 2016-06-18 LAB — LIPID PANEL
Cholesterol: 140 mg/dL (ref 0–200)
HDL: 33.2 mg/dL — ABNORMAL LOW
NonHDL: 107.29
Total CHOL/HDL Ratio: 4
Triglycerides: 215 mg/dL — ABNORMAL HIGH (ref 0.0–149.0)
VLDL: 43 mg/dL — ABNORMAL HIGH (ref 0.0–40.0)

## 2016-06-18 LAB — LDL CHOLESTEROL, DIRECT: Direct LDL: 84 mg/dL

## 2016-06-18 LAB — URIC ACID: Uric Acid, Serum: 5.3 mg/dL (ref 4.0–7.8)

## 2016-06-18 LAB — PSA: PSA: 0.35 ng/mL (ref 0.10–4.00)

## 2016-06-18 LAB — TSH: TSH: 1.62 u[IU]/mL (ref 0.35–4.50)

## 2016-06-18 MED ORDER — FLUTICASONE FUROATE-VILANTEROL 100-25 MCG/INH IN AEPB: 1.0000 | INHALATION_SPRAY | Freq: Every day | RESPIRATORY_TRACT | 11 refills | Status: DC

## 2016-06-18 MED ORDER — AZELASTINE-FLUTICASONE 137-50 MCG/ACT NA SUSP: 2.0000 | Freq: Two times a day (BID) | NASAL | 11 refills | Status: DC

## 2016-06-18 MED ORDER — CETIRIZINE HCL 10 MG PO TABS: 10.0000 mg | ORAL_TABLET | Freq: Every day | ORAL | 11 refills | Status: DC

## 2016-06-18 NOTE — Patient Instructions (Signed)

## 2016-06-18 NOTE — Progress Notes (Signed)
Pre visit review using our clinic review tool, if applicable. No additional management support is needed unless otherwise documented below in the visit note. 

## 2016-06-18 NOTE — Progress Notes (Signed)
Subjective:  Patient ID: Rickey Hurley, male    DOB: [birth year]  Age: 50 y.o. MRN: [id number]  CC: Annual Exam; Allergic Rhinitis ; Asthma; and Hypertension   HPI Rickey Hurley presents for a CPX.  He complains of a 4 week history of mild discomfort in his right ear with nasal congestion. He has a history of allergies and asthma. He has had no loss of hearing, dizziness, vertigo, facial pain, nasal phlegm, or postnasal drip. He also complains of wheezing at night.  He is going to [place] later this year and wants to be vaccinated against hepatitis A and screen for hepatitis A and B prior infection or prior immunity.  Outpatient Medications Prior to Visit  Medication Sig Dispense Refill  . allopurinol (ZYLOPRIM) 300 MG tablet TAKE 1 TABLET DAILY 90 tablet 3  . aspirin 81 MG tablet Take 81 mg by mouth daily.      . cyclobenzaprine (AMRIX) 15 MG 24 hr capsule Take 1 capsule (15 mg total) by mouth daily as needed for muscle spasms. 30 capsule 2  . diclofenac (VOLTAREN) 75 MG EC tablet Take 1 tablet (75 mg total) by mouth 2 (two) times daily. Yearly physical w/labs due in Sept must see MD for refill 180 tablet 0  . fenofibrate 160 MG tablet TAKE 1 TABLET DAILY 90 tablet 3  . metoprolol tartrate (LOPRESSOR) 25 MG tablet TAKE 1 TABLET TWICE A DAY 180 tablet 3  . Multiple Vitamin (MULTIVITAMIN) tablet Take 1 tablet by mouth daily.      . Omega-3 Fatty Acids (FISH OIL) 1000 MG CAPS Take by mouth daily.      . simvastatin (ZOCOR) 40 MG tablet TAKE 1 TABLET AT BEDTIME 90 tablet 3  . Sulfacetamide Sodium-Sulfur 10-5 % CREA Apply 1 application topically 2 (two) times daily as needed. 120 g 3  . diazepam (VALIUM) 5 MG tablet Take 1 tablet (5 mg total) by mouth every 12 (twelve) hours as needed for anxiety. Take 1-2 tabs 1 hour prior to MRI 5 tablet 0   No facility-administered medications prior to visit.     ROS Review of Systems  Constitutional: Positive for unexpected weight change. Negative for  appetite change, chills, diaphoresis and fatigue.  HENT: Positive for congestion and ear pain. Negative for ear discharge, facial swelling, postnasal drip, rhinorrhea, sinus pressure, sneezing, sore throat, tinnitus, trouble swallowing and voice change.   Eyes: Negative.  Negative for visual disturbance.  Respiratory: Negative.  Negative for cough, choking, shortness of breath and stridor.   Cardiovascular: Negative.  Negative for palpitations and leg swelling.  Gastrointestinal: Negative.  Negative for abdominal pain, constipation, diarrhea, nausea and vomiting.  Endocrine: Negative.  Negative for polydipsia, polyphagia and polyuria.  Genitourinary: Negative.  Negative for dysuria, penile swelling, scrotal swelling, testicular pain and urgency.  Musculoskeletal: Negative.   Skin: Negative.   Allergic/Immunologic: Negative.   Neurological: Negative.  Negative for dizziness, weakness, light-headedness and numbness.  Hematological: Negative.  Negative for adenopathy. Does not bruise/bleed easily.  Psychiatric/Behavioral: Negative.     Objective:  BP 124/82 (BP Location: Right Arm, Patient Position: Sitting, Cuff Size: Large)   Pulse 62   Temp 98.3 F (36.8 C) (Oral)   Resp 16   Ht 5\' 11"  (1.803 m)   Wt (!) 316 lb 4 oz (143.5 kg)   SpO2 96%   BMI 44.11 kg/m   BP Readings from Last 3 Encounters:  06/18/16 124/82  08/22/15 118/80  01/15/15 (!) 138/92    [employer]  Readings from Last 3 Encounters:  06/18/16 (!) 316 lb 4 oz (143.5 kg)  08/22/15 (!) 313 lb (142 kg)  01/15/15 (!) 301 lb (136.5 kg)    Physical Exam  Constitutional: He is oriented to person, place, and time. No distress.  HENT:  Head: Normocephalic and atraumatic.  Right Ear: Hearing, tympanic membrane, external ear and ear canal normal.  Left Ear: Hearing, tympanic membrane, external ear and ear canal normal.  Nose: Nose normal. No mucosal edema, rhinorrhea or sinus tenderness. No epistaxis. Right sinus exhibits no  maxillary sinus tenderness and no frontal sinus tenderness. Left sinus exhibits no maxillary sinus tenderness and no frontal sinus tenderness.  Mouth/Throat: Oropharynx is clear and moist and mucous membranes are normal. Mucous membranes are not pale, not dry and not cyanotic. No oropharyngeal exudate, posterior oropharyngeal edema, posterior oropharyngeal erythema or tonsillar abscesses.  Eyes: Conjunctivae are normal. Right eye exhibits no discharge. Left eye exhibits no discharge. No scleral icterus.  Neck: Normal range of motion. Neck supple. No JVD present. No tracheal deviation present. No thyromegaly present.  Cardiovascular: Normal rate, regular rhythm, normal heart sounds and intact distal pulses.  Exam reveals no gallop and no friction rub.   No murmur heard. Pulmonary/Chest: Effort normal and breath sounds normal. No stridor. No respiratory distress. He has no wheezes. He has no rales. He exhibits no tenderness.  Abdominal: Soft. Bowel sounds are normal. He exhibits no distension and no mass. There is no tenderness. There is no rebound and no guarding.  Musculoskeletal: Normal range of motion. He exhibits no edema or tenderness.  Lymphadenopathy:    He has no cervical adenopathy.  Neurological: He is oriented to person, place, and time.  Skin: Skin is warm and dry. No rash noted. He is not diaphoretic. No erythema.  Vitals reviewed.   Lab Results  Component Value Date   WBC 6.6 06/18/2016   HGB 13.2 06/18/2016   HCT 38.9 (L) 06/18/2016   PLT 299.0 06/18/2016   GLUCOSE 137 (H) 06/18/2016   CHOL 140 06/18/2016   TRIG 215.0 (H) 06/18/2016   HDL 33.20 (L) 06/18/2016   LDLDIRECT 84.0 06/18/2016   LDLCALC 97 01/30/2014   ALT 52 06/18/2016   AST 25 06/18/2016   NA 142 06/18/2016   K 4.4 06/18/2016   CL 108 06/18/2016   CREATININE 0.98 06/18/2016   BUN 17 06/18/2016   CO2 27 06/18/2016   TSH 1.62 06/18/2016   PSA 0.35 06/18/2016   HGBA1C 6.2 06/18/2016    No results  found.  Assessment & Plan:   Rickey Hurley was seen today for annual exam, allergic rhinitis , asthma and hypertension.  Diagnoses and all orders for this visit:  Preventative health care- exam completed, labs ordered and reviewed, vaccines reviewed, patient education material was given. -     Lipid panel; Future -     Comprehensive metabolic panel; Future -     CBC with Differential/Platelet; Future -     PSA; Future -     TSH; Future -     Urinalysis, Routine w reflex microscopic (not at [facility]); Future -     Hepatitis A antibody, total; Future -     Hepatitis B core antibody, total; Future -     Hepatitis B surface antibody; Future  Asthma, mild intermittent, uncomplicated- I've asked him to start using Breo but he tells me insurance will not cover, it instead will switch to budesonide. -     Discontinue: fluticasone furoate-vilanterol (BREO ELLIPTA)  100-25 MCG/INH AEPB; Inhale 1 puff into the lungs daily. -     budesonide (PULMICORT) 180 MCG/ACT inhaler 1 puff; Inhale 1 puff into the lungs 2 (two) times daily.  Essential hypertension- his blood pressures adequately well-controlled.  Other seasonal allergic rhinitis- I asked him to start using Dymista but his insurance will not cover it instead will switch to generic Flonase and of asked him to start Zyrtec -     Discontinue: Azelastine-Fluticasone (DYMISTA) 137-50 MCG/ACT SUSP; Place 2 puffs into the nose 2 (two) times daily. -     cetirizine (ZYRTEC) 10 MG tablet; Take 1 tablet (10 mg total) by mouth daily. -     fluticasone (FLONASE) 50 MCG/ACT nasal spray; Place 2 sprays into both nostrils daily.  Dysfunction of right Eustachian tube- will start a combination of a steroid and antihistamine nasal spray and of asked him to start Zyrtec as well. -     Discontinue: Azelastine-Fluticasone (DYMISTA) 137-50 MCG/ACT SUSP; Place 2 puffs into the nose 2 (two) times daily. -     cetirizine (ZYRTEC) 10 MG tablet; Take 1 tablet (10 mg total) by mouth  daily.  Idiopathic gout, unspecified chronicity, unspecified site- improvement noted -     Uric acid; Future  Need for prophylactic vaccination and inoculation against viral hepatitis -     Hepatitis A vaccine adult IM  Hyperglycemia- his A1c is up to 6.2%, he agrees to work on his lifestyle modification and I recommend that he start using Saxenda -     Hemoglobin A1c; Future  Morbid obesity due to excess calories [facility])- will start Saxenda in addition to lifestyle moditications   I have discontinued Rickey Hurley diazepam, fluticasone furoate-vilanterol, and Azelastine-Fluticasone. I am also having him start on cetirizine, fluticasone, and Liraglutide -Weight Management. Additionally, I am having him maintain his aspirin, multivitamin, Fish Oil, Sulfacetamide Sodium-Sulfur, cyclobenzaprine, allopurinol, simvastatin, metoprolol tartrate, fenofibrate, and diclofenac. We will continue to administer budesonide.  Meds ordered this encounter  Medications  . DISCONTD: fluticasone furoate-vilanterol (BREO ELLIPTA) 100-25 MCG/INH AEPB    Sig: Inhale 1 puff into the lungs daily.    Dispense:  30 each    Refill:  11  . DISCONTD: Azelastine-Fluticasone (DYMISTA) 137-50 MCG/ACT SUSP    Sig: Place 2 puffs into the nose 2 (two) times daily.    Dispense:  23 g    Refill:  11  . cetirizine (ZYRTEC) 10 MG tablet    Sig: Take 1 tablet (10 mg total) by mouth daily.    Dispense:  30 tablet    Refill:  11  . budesonide (PULMICORT) 180 MCG/ACT inhaler 1 puff  . fluticasone (FLONASE) 50 MCG/ACT nasal spray    Sig: Place 2 sprays into both nostrils daily.    Dispense:  48 g    Refill:  3  . Liraglutide -Weight Management (SAXENDA) 18 MG/3ML SOPN    Sig: Inject 1 Act into the skin daily.    Dispense:  3 mL    Refill:  5     Follow-up: Return in about 2 months (around 08/19/2016).  Sanda Linger, MD

## 2016-06-19 ENCOUNTER — Encounter: Payer: Self-pay | Admitting: Internal Medicine

## 2016-06-19 LAB — HEPATITIS B CORE ANTIBODY, TOTAL: Hep B Core Total Ab: NONREACTIVE

## 2016-06-19 LAB — HEPATITIS B SURFACE ANTIBODY,QUALITATIVE: Hep B S Ab: POSITIVE — AB

## 2016-06-19 LAB — HEPATITIS A ANTIBODY, TOTAL: HEP A TOTAL AB: NONREACTIVE

## 2016-06-19 MED ORDER — FLUTICASONE PROPIONATE 50 MCG/ACT NA SUSP: 2.0000 | Freq: Every day | NASAL | 3 refills | Status: DC

## 2016-06-19 MED ORDER — BUDESONIDE 180 MCG/ACT IN AEPB: 1.0000 | INHALATION_SPRAY | Freq: Two times a day (BID) | RESPIRATORY_TRACT | Status: DC

## 2016-06-20 ENCOUNTER — Encounter: Payer: Self-pay | Admitting: *Deleted

## 2016-06-20 ENCOUNTER — Other Ambulatory Visit: Payer: Self-pay | Admitting: *Deleted

## 2016-06-20 MED ORDER — GLUCOSE BLOOD VI STRP: 1.0000 | ORAL_STRIP | Freq: Two times a day (BID) | 3 refills | Status: DC

## 2016-06-21 MED ORDER — LIRAGLUTIDE -WEIGHT MANAGEMENT 18 MG/3ML ~~LOC~~ SOPN: 1.0000 | PEN_INJECTOR | Freq: Every day | SUBCUTANEOUS | 5 refills | Status: DC

## 2016-06-23 ENCOUNTER — Telehealth: Payer: Self-pay | Admitting: *Deleted

## 2016-06-23 ENCOUNTER — Encounter: Payer: Self-pay | Admitting: Internal Medicine

## 2016-06-23 ENCOUNTER — Other Ambulatory Visit: Payer: Self-pay | Admitting: Internal Medicine

## 2016-06-23 MED ORDER — ONETOUCH DELICA LANCETS 33G MISC: 3 refills | Status: DC

## 2016-06-23 MED ORDER — ONETOUCH ULTRA 2 W/DEVICE KIT: PACK | 0 refills | Status: DC

## 2016-06-23 NOTE — Telephone Encounter (Signed)
Rec'd call from [facility] stating they have two strips for pt testing strips. One touch ultra & one touch ultra verio. Needing to know which strips pt is using. Inform rep will have to contact pt to double check per chart its one touch ultra. Called pt verified which strips does he used. Pt stated none this is the first time he have to start taking BS. He also need BS monitor kit. Called [facility] back inform pt will use the one touch ultra blue, but also need the strips & monitor...Johny Chess

## 2016-06-25 ENCOUNTER — Ambulatory Visit (INDEPENDENT_AMBULATORY_CARE_PROVIDER_SITE_OTHER): Payer: BC Managed Care – PPO | Admitting: Internal Medicine

## 2016-06-25 VITALS — BP 132/60 | HR 60 | Resp 16 | Ht 71.0 in | Wt 315.0 lb

## 2016-06-25 DIAGNOSIS — R739 Hyperglycemia, unspecified: Secondary | ICD-10-CM | POA: Diagnosis not present

## 2016-06-25 MED ORDER — LIRAGLUTIDE -WEIGHT MANAGEMENT 18 MG/3ML ~~LOC~~ SOPN: 3.0000 mg | PEN_INJECTOR | Freq: Every day | SUBCUTANEOUS | 5 refills | Status: DC

## 2016-06-25 MED ORDER — INSULIN PEN NEEDLE 32G X 6 MM MISC: 1.0000 | Freq: Every day | 3 refills | Status: DC

## 2016-06-25 NOTE — Patient Instructions (Signed)
Obesity Obesity is defined as having too much total body fat and a body mass index (BMI) of 30 or more. BMI is an estimate of body fat and is calculated from your height and weight. BMI is typically calculated by your health care provider during regular wellness visits. Obesity happens when you consume more calories than you can burn by exercising or performing daily physical tasks. Prolonged obesity can cause major illnesses or emergencies, such as:  Stroke.  Heart disease.  Diabetes.  Cancer.  Arthritis.  High blood pressure (hypertension).  High cholesterol.  Sleep apnea.  Erectile dysfunction.  Infertility problems. CAUSES   Regularly eating unhealthy foods.  Physical inactivity.  Certain disorders, such as an underactive thyroid (hypothyroidism), Cushing's syndrome, and polycystic ovarian syndrome.  Certain medicines, such as steroids, some depression medicines, and antipsychotics.  Genetics.  Lack of sleep. DIAGNOSIS A health care provider can diagnose obesity after calculating your BMI. Obesity will be diagnosed if your BMI is 30 or higher. There are other methods of measuring obesity levels. Some other methods include measuring your skinfold thickness, your waist circumference, and comparing your hip circumference to your waist circumference. TREATMENT  A healthy treatment program includes some or all of the following:  Long-term dietary changes.  Exercise and physical activity.  Behavioral and lifestyle changes.  Medicine only under the supervision of your health care provider. Medicines may help, but only if they are used with diet and exercise programs. If your BMI is 40 or higher, your health care provider may recommend specialized surgery or programs to help with weight loss. An unhealthy treatment program includes:  Fasting.  Fad diets.  Supplements and drugs. These choices do not succeed in long-term weight control. HOME CARE  INSTRUCTIONS  Exercise and perform physical activity as directed by your health care provider. To increase physical activity, try the following:  Use stairs instead of elevators.  Park farther away from store entrances.  Garden, bike, or walk instead of watching television or using the computer.  Eat healthy, low-calorie foods and drinks on a regular basis. Eat more fruits and vegetables. Use low-calorie cookbooks or take healthy cooking classes.  Limit fast food, sweets, and processed snack foods.  Eat smaller portions.  Keep a daily journal of everything you eat. There are many free websites to help you with this. It may be helpful to measure your foods so you can determine if you are eating the correct portion sizes.  Avoid drinking alcohol. Drink more water and drinks without calories.  Take vitamins and supplements only as recommended by your health care provider.  Weight-loss support groups, registered dietitians, counselors, and stress reduction education can also be very helpful. SEEK IMMEDIATE MEDICAL CARE IF:  You have chest pain or tightness.  You have trouble breathing or feel short of breath.  You have weakness or leg numbness.  You feel confused or have trouble talking.  You have sudden changes in your vision.   This information is not intended to replace advice given to you by your health care provider. Make sure you discuss any questions you have with your health care provider.   Document Released: 12/18/2004 Document Revised: 12/01/2014 Document Reviewed: 12/17/2011 Elsevier Interactive Patient Education 2016 [employer]  

## 2016-06-25 NOTE — Progress Notes (Signed)
Subjective:  Patient ID: Rickey Hurley, male    DOB: 11/04/1966  Age: 50 y.o. MRN: [id number]  CC: Medication Management   HPI Keigen Caddell presents for Initiation of Saxenda therapy. He is working on his lifestyle modifications to lose weight and lower his blood sugar. He feels better since I last saw him and he offers no complaints today.  Outpatient Medications Prior to Visit  Medication Sig Dispense Refill  . allopurinol (ZYLOPRIM) 300 MG tablet TAKE 1 TABLET DAILY 90 tablet 3  . aspirin 81 MG tablet Take 81 mg by mouth daily.      . Blood Glucose Monitoring Suppl (ONE TOUCH ULTRA 2) w/Device KIT Use to check blood sugars twice a day 1 each 0  . cetirizine (ZYRTEC) 10 MG tablet Take 1 tablet (10 mg total) by mouth daily. 30 tablet 11  . cyclobenzaprine (AMRIX) 15 MG 24 hr capsule Take 1 capsule (15 mg total) by mouth daily as needed for muscle spasms. 30 capsule 2  . diclofenac (VOLTAREN) 75 MG EC tablet Take 1 tablet (75 mg total) by mouth 2 (two) times daily. Yearly physical w/labs due in Sept must see MD for refill 180 tablet 0  . fenofibrate 160 MG tablet TAKE 1 TABLET DAILY 90 tablet 3  . fluticasone (FLONASE) 50 MCG/ACT nasal spray Place 2 sprays into both nostrils daily. 48 g 3  . glucose blood (ONE TOUCH ULTRA TEST) test strip 1 each by Other route 2 (two) times daily. Use to check blood sugars twice a day Dx E11.9 300 each 3  . metoprolol tartrate (LOPRESSOR) 25 MG tablet TAKE 1 TABLET TWICE A DAY 180 tablet 3  . Multiple Vitamin (MULTIVITAMIN) tablet Take 1 tablet by mouth daily.      . Omega-3 Fatty Acids (FISH OIL) 1000 MG CAPS Take by mouth daily.      [name] DELICA LANCETS 70Y MISC Use to check blood sugars twice a day 300 each 3  . simvastatin (ZOCOR) 40 MG tablet TAKE 1 TABLET AT BEDTIME 90 tablet 3  . Sulfacetamide Sodium-Sulfur 10-5 % CREA Apply 1 application topically 2 (two) times daily as needed. 120 g 3  . Liraglutide -Weight Management (SAXENDA) 18 MG/3ML SOPN  Inject 1 Act into the skin daily. 3 mL 5   Facility-Administered Medications Prior to Visit  Medication Dose Route Frequency Provider Last Rate Last Dose  . budesonide (PULMICORT) 180 MCG/ACT inhaler 1 puff  1 puff Inhalation BID Janith Lima, MD        ROS Review of Systems  All other systems reviewed and are negative.   Objective:  BP 132/60   Pulse 60   Resp 16   Ht 5' 11"  (1.803 m)   Wt (!) 315 lb (142.9 kg)   SpO2 96%   BMI 43.93 kg/m   BP Readings from Last 3 Encounters:  06/25/16 132/60  06/18/16 124/82  08/22/15 118/80    Wt Readings from Last 3 Encounters:  06/25/16 (!) 315 lb (142.9 kg)  06/18/16 (!) 316 lb 4 oz (143.5 kg)  08/22/15 (!) 313 lb (142 kg)    Physical Exam  Constitutional: He is oriented to person, place, and time. No distress.  HENT:  Mouth/Throat: Oropharynx is clear and moist. No oropharyngeal exudate.  Eyes: Conjunctivae are normal. Right eye exhibits no discharge. Left eye exhibits no discharge. No scleral icterus.  Neck: Normal range of motion. Neck supple. No JVD present. No tracheal deviation present. No thyromegaly present.  Cardiovascular: Normal rate, regular rhythm, normal heart sounds and intact distal pulses.  Exam reveals no gallop and no friction rub.   No murmur heard. Pulmonary/Chest: Effort normal and breath sounds normal. No stridor. No respiratory distress. He has no wheezes. He has no rales. He exhibits no tenderness.  Abdominal: Soft. Bowel sounds are normal. He exhibits no distension and no mass. There is no tenderness. There is no rebound and no guarding.  Musculoskeletal: Normal range of motion. He exhibits no edema, tenderness or deformity.  Lymphadenopathy:    He has no cervical adenopathy.  Neurological: He is oriented to person, place, and time.  Skin: Skin is warm and dry. No rash noted. He is not diaphoretic. No erythema. No pallor.  Vitals reviewed.   Lab Results  Component Value Date   WBC 6.6 06/18/2016     HGB 13.2 06/18/2016   HCT 38.9 (L) 06/18/2016   PLT 299.0 06/18/2016   GLUCOSE 137 (H) 06/18/2016   CHOL 140 06/18/2016   TRIG 215.0 (H) 06/18/2016   HDL 33.20 (L) 06/18/2016   LDLDIRECT 84.0 06/18/2016   LDLCALC 97 01/30/2014   ALT 52 06/18/2016   AST 25 06/18/2016   NA 142 06/18/2016   K 4.4 06/18/2016   CL 108 06/18/2016   CREATININE 0.98 06/18/2016   BUN 17 06/18/2016   CO2 27 06/18/2016   TSH 1.62 06/18/2016   PSA 0.35 06/18/2016   HGBA1C 6.2 06/18/2016    No results found.  Assessment & Plan:   Christie was seen today for medication management.  Diagnoses and all orders for this visit:  Hyperglycemia -     Liraglutide -Weight Management (SAXENDA) 18 MG/3ML SOPN; Inject 3 mg into the skin daily. -     Insulin Pen Needle (NOVOFINE) 32G X 6 MM MISC; 1 Act by Does not apply route daily.  Morbid obesity due to excess calories ([place])- he was given a sample pen, he was instructed on the dose escalation starting at 0.6 milligrams a day and then increasing over the next 3 weeks. He demonstrated understanding of this dosing and use of the pen. -     Liraglutide -Weight Management (SAXENDA) 18 MG/3ML SOPN; Inject 3 mg into the skin daily. -     Insulin Pen Needle (NOVOFINE) 32G X 6 MM MISC; 1 Act by Does not apply route daily.   I have discontinued Mr. Inclan Liraglutide -Weight Management. I am also having him start on Liraglutide -Weight Management and Insulin Pen Needle. Additionally, I am having him maintain his aspirin, multivitamin, Fish Oil, Sulfacetamide Sodium-Sulfur, cyclobenzaprine, allopurinol, simvastatin, metoprolol tartrate, fenofibrate, diclofenac, cetirizine, fluticasone, glucose blood, ONETOUCH DELICA LANCETS 67E, and ONE TOUCH ULTRA 2. We will continue to administer budesonide.  Meds ordered this encounter  Medications  . Liraglutide -Weight Management (SAXENDA) 18 MG/3ML SOPN    Sig: Inject 3 mg into the skin daily.    Dispense:  5 pen    Refill:  5  .  Insulin Pen Needle (NOVOFINE) 32G X 6 MM MISC    Sig: 1 Act by Does not apply route daily.    Dispense:  100 each    Refill:  3     Follow-up: Return in about 3 months (around 09/25/2016).  Scarlette Calico, MD

## 2016-06-27 ENCOUNTER — Encounter: Payer: Self-pay | Admitting: Internal Medicine

## 2016-07-01 ENCOUNTER — Telehealth: Payer: Self-pay

## 2016-07-01 NOTE — Telephone Encounter (Signed)
PA initiated via CoverMyMeds key [id number]

## 2016-07-02 NOTE — Telephone Encounter (Signed)
PA APPROVED 06/01/2016 - 10/02/2016, pharmacy advised via CoverMyMeds

## 2016-07-16 ENCOUNTER — Other Ambulatory Visit: Payer: Self-pay | Admitting: Internal Medicine

## 2016-07-16 ENCOUNTER — Encounter: Payer: Self-pay | Admitting: Internal Medicine

## 2016-07-16 DIAGNOSIS — R739 Hyperglycemia, unspecified: Secondary | ICD-10-CM

## 2016-07-16 MED ORDER — GLUCOSE BLOOD VI STRP: 1.0000 | ORAL_STRIP | Freq: Two times a day (BID) | 3 refills | Status: DC

## 2016-07-21 ENCOUNTER — Other Ambulatory Visit: Payer: Self-pay

## 2016-07-21 DIAGNOSIS — R739 Hyperglycemia, unspecified: Secondary | ICD-10-CM

## 2016-07-21 MED ORDER — GLUCOSE BLOOD VI STRP: ORAL_STRIP | 1 refills | Status: DC

## 2016-08-15 ENCOUNTER — Other Ambulatory Visit: Payer: Self-pay | Admitting: Internal Medicine

## 2016-08-29 ENCOUNTER — Encounter: Payer: Self-pay | Admitting: Internal Medicine

## 2016-09-01 MED ORDER — GLUCOSE BLOOD VI STRP: ORAL_STRIP | 3 refills | Status: DC

## 2016-10-31 ENCOUNTER — Telehealth: Payer: Self-pay

## 2016-10-31 NOTE — Telephone Encounter (Signed)
Pt came in and weighed. Current wt is 288.02lb  PA submitted via cover my meds.   Key: [id number]

## 2016-11-04 NOTE — Telephone Encounter (Signed)
PA approved through 10/28/2017.  Message sent to pt. Forwarding to PCP as FYI.

## 2016-11-26 ENCOUNTER — Other Ambulatory Visit: Payer: Self-pay | Admitting: Internal Medicine

## 2016-12-22 ENCOUNTER — Other Ambulatory Visit: Payer: Self-pay | Admitting: Internal Medicine

## 2016-12-22 DIAGNOSIS — R739 Hyperglycemia, unspecified: Secondary | ICD-10-CM

## 2017-04-14 ENCOUNTER — Encounter: Payer: Self-pay | Admitting: Internal Medicine

## 2017-04-14 ENCOUNTER — Ambulatory Visit (INDEPENDENT_AMBULATORY_CARE_PROVIDER_SITE_OTHER): Payer: BC Managed Care – PPO | Admitting: Internal Medicine

## 2017-04-14 ENCOUNTER — Other Ambulatory Visit: Payer: BC Managed Care – PPO

## 2017-04-14 VITALS — BP 152/98 | HR 82 | Temp 98.7°F | Resp 14 | Ht 71.0 in | Wt 290.0 lb

## 2017-04-14 DIAGNOSIS — R300 Dysuria: Secondary | ICD-10-CM | POA: Insufficient documentation

## 2017-04-14 LAB — POC URINALSYSI DIPSTICK (AUTOMATED)
Bilirubin, UA: NEGATIVE
Glucose, UA: NEGATIVE
KETONES UA: NEGATIVE
Leukocytes, UA: NEGATIVE
Nitrite, UA: NEGATIVE
PH UA: 6 (ref 5.0–8.0)
PROTEIN UA: NEGATIVE
RBC UA: NEGATIVE
SPEC GRAV UA: 1.02 (ref 1.010–1.025)
UROBILINOGEN UA: 0.2 U/dL

## 2017-04-14 MED ORDER — SULFAMETHOXAZOLE-TRIMETHOPRIM 800-160 MG PO TABS: 1.0000 | ORAL_TABLET | Freq: Two times a day (BID) | ORAL | 0 refills | Status: DC

## 2017-04-14 NOTE — Patient Instructions (Signed)
We have sent in bactrim to take for the prostate.   Take 1 pill twice a day for 3 weeks. We will check for infection in the urine with culture and send you the result.s

## 2017-04-14 NOTE — Progress Notes (Signed)
   Subjective:    Patient ID: Rickey Hurley, male    DOB: 1966/02/14, 51 y.o.   MRN: [id number]  HPI The patient is a 51 YO man coming in for dysuria and pressure on his bladder. Denies blood in urine, abdominal pain, back pain, fevers or chills. Denies new partners and is monogamous with same partner for 5 years and no concern for STI. No discharge penile or lesion, no testicular swelling or lesion. Some discomfort with defecating. Started about 1 week ago, symptoms stable since then. Not worsened but still present. Does have history of c dif.   Review of Systems  Constitutional: Positive for appetite change. Negative for activity change, fatigue, fever and unexpected weight change.  Respiratory: Negative.   Cardiovascular: Negative.   Gastrointestinal: Positive for nausea. Negative for abdominal distention, abdominal pain, diarrhea and vomiting.  Genitourinary: Positive for dysuria, frequency and urgency. Negative for decreased urine volume, difficulty urinating, discharge, enuresis, flank pain, genital sores, hematuria, penile pain, penile swelling, scrotal swelling and testicular pain.  Musculoskeletal: Negative.   Skin: Negative.   Neurological: Negative.       Objective:   Physical Exam  Constitutional: He is oriented to person, place, and time. He appears well-developed and well-nourished.  HENT:  Head: Normocephalic and atraumatic.  Eyes: EOM are normal.  Cardiovascular: Normal rate and regular rhythm.   Pulmonary/Chest: Effort normal.  Abdominal: Soft. Bowel sounds are normal. He exhibits no distension. There is no tenderness. There is no rebound.  Genitourinary:  Genitourinary Comments: Declines genital exam  Neurological: He is alert and oriented to person, place, and time.  Skin: Skin is warm and dry.   Vitals:   04/14/17 1325  BP: (!) 152/98  Pulse: 82  Resp: 14  Temp: 98.7 F (37.1 C)  TempSrc: Oral  SpO2: 99%  Weight: 290 lb (131.5 kg)  Height: 5\' 11"  (1.803 m)       Assessment & Plan:

## 2017-04-14 NOTE — Assessment & Plan Note (Signed)
Suspect prostatitis. U/A done in the office and not consistent with infection or kidney stone. Given hx of C dif not appropriate for ciprofloxacin. Rx for bactrim 3 weeks. Sending urine for culture.

## 2017-04-15 LAB — URINE CULTURE: ORGANISM ID, BACTERIA: NO GROWTH

## 2017-04-22 ENCOUNTER — Ambulatory Visit: Payer: Self-pay | Admitting: Internal Medicine

## 2017-05-04 ENCOUNTER — Encounter: Payer: Self-pay | Admitting: Internal Medicine

## 2017-05-04 ENCOUNTER — Other Ambulatory Visit: Payer: Self-pay | Admitting: Internal Medicine

## 2017-05-04 DIAGNOSIS — J0101 Acute recurrent maxillary sinusitis: Secondary | ICD-10-CM

## 2017-05-04 MED ORDER — AMOXICILLIN-POT CLAVULANATE 875-125 MG PO TABS: 1.0000 | ORAL_TABLET | Freq: Two times a day (BID) | ORAL | 0 refills | Status: AC

## 2017-05-07 ENCOUNTER — Ambulatory Visit (INDEPENDENT_AMBULATORY_CARE_PROVIDER_SITE_OTHER): Payer: BC Managed Care – PPO | Admitting: Internal Medicine

## 2017-05-07 ENCOUNTER — Encounter: Payer: Self-pay | Admitting: Internal Medicine

## 2017-05-07 ENCOUNTER — Other Ambulatory Visit (INDEPENDENT_AMBULATORY_CARE_PROVIDER_SITE_OTHER): Payer: BC Managed Care – PPO

## 2017-05-07 VITALS — BP 110/80 | HR 68 | Temp 98.1°F | Resp 16 | Ht 71.0 in | Wt 272.0 lb

## 2017-05-07 DIAGNOSIS — R7303 Prediabetes: Secondary | ICD-10-CM

## 2017-05-07 DIAGNOSIS — M1000 Idiopathic gout, unspecified site: Secondary | ICD-10-CM

## 2017-05-07 DIAGNOSIS — M1A09X0 Idiopathic chronic gout, multiple sites, without tophus (tophi): Secondary | ICD-10-CM | POA: Diagnosis not present

## 2017-05-07 DIAGNOSIS — E785 Hyperlipidemia, unspecified: Secondary | ICD-10-CM | POA: Diagnosis not present

## 2017-05-07 DIAGNOSIS — D539 Nutritional anemia, unspecified: Secondary | ICD-10-CM | POA: Diagnosis not present

## 2017-05-07 DIAGNOSIS — I10 Essential (primary) hypertension: Secondary | ICD-10-CM

## 2017-05-07 DIAGNOSIS — R739 Hyperglycemia, unspecified: Secondary | ICD-10-CM | POA: Diagnosis not present

## 2017-05-07 DIAGNOSIS — R7989 Other specified abnormal findings of blood chemistry: Secondary | ICD-10-CM | POA: Diagnosis not present

## 2017-05-07 LAB — HEMOGLOBIN A1C: HEMOGLOBIN A1C: 5.7 % (ref 4.6–6.5)

## 2017-05-07 LAB — CBC WITH DIFFERENTIAL/PLATELET
Basophils Absolute: 0.1 10*3/uL (ref 0.0–0.1)
Basophils Relative: 1.2 % (ref 0.0–3.0)
EOS ABS: 0.1 10*3/uL (ref 0.0–0.7)
Eosinophils Relative: 1.5 % (ref 0.0–5.0)
HCT: 38.8 % — ABNORMAL LOW (ref 39.0–52.0)
Hemoglobin: 13.2 g/dL (ref 13.0–17.0)
LYMPHS ABS: 1.7 10*3/uL (ref 0.7–4.0)
Lymphocytes Relative: 20.9 % (ref 12.0–46.0)
MCHC: 34.1 g/dL (ref 30.0–36.0)
MCV: 89.3 fl (ref 78.0–100.0)
MONO ABS: 0.6 10*3/uL (ref 0.1–1.0)
MONOS PCT: 7.4 % (ref 3.0–12.0)
NEUTROS ABS: 5.7 10*3/uL (ref 1.4–7.7)
NEUTROS PCT: 69 % (ref 43.0–77.0)
PLATELETS: 485 10*3/uL — AB (ref 150.0–400.0)
RBC: 4.34 Mil/uL (ref 4.22–5.81)
RDW: 13.2 % (ref 11.5–15.5)
WBC: 8.3 10*3/uL (ref 4.0–10.5)

## 2017-05-07 LAB — COMPREHENSIVE METABOLIC PANEL
ALK PHOS: 169 U/L — AB (ref 39–117)
ALT: 150 U/L — AB (ref 0–53)
AST: 46 U/L — AB (ref 0–37)
Albumin: 4.4 g/dL (ref 3.5–5.2)
BILIRUBIN TOTAL: 0.5 mg/dL (ref 0.2–1.2)
BUN: 20 mg/dL (ref 6–23)
CO2: 27 mEq/L (ref 19–32)
CREATININE: 1.09 mg/dL (ref 0.40–1.50)
Calcium: 10.4 mg/dL (ref 8.4–10.5)
Chloride: 101 mEq/L (ref 96–112)
GFR: 76.03 mL/min (ref >60.00)
GLUCOSE: 93 mg/dL (ref 70–99)
Potassium: 4.8 mEq/L (ref 3.5–5.1)
SODIUM: 138 meq/L (ref 135–145)
TOTAL PROTEIN: 8 g/dL (ref 6.0–8.3)

## 2017-05-07 LAB — FERRITIN: FERRITIN: 447.4 ng/mL — AB (ref 22.0–322.0)

## 2017-05-07 LAB — FOLATE: Folate: >24 ng/mL (ref >5.9)

## 2017-05-07 LAB — URINALYSIS, ROUTINE W REFLEX MICROSCOPIC
BILIRUBIN URINE: NEGATIVE
HGB URINE DIPSTICK: NEGATIVE
KETONES UR: NEGATIVE
LEUKOCYTES UA: NEGATIVE
Nitrite: NEGATIVE
RBC / HPF: NONE SEEN (ref 0–?)
Specific Gravity, Urine: 1.025 (ref 1.000–1.030)
Total Protein, Urine: 30 — AB
URINE GLUCOSE: NEGATIVE
UROBILINOGEN UA: 1 (ref 0.0–1.0)
pH: 6 (ref 5.0–8.0)

## 2017-05-07 LAB — LDL CHOLESTEROL, DIRECT: Direct LDL: 78 mg/dL

## 2017-05-07 LAB — LIPID PANEL
CHOL/HDL RATIO: 5
CHOLESTEROL: 139 mg/dL (ref 0–200)
HDL: 26.8 mg/dL — AB (ref >39.00)
NONHDL: 111.85
TRIGLYCERIDES: 210 mg/dL — AB (ref 0.0–149.0)
VLDL: 42 mg/dL — AB (ref 0.0–40.0)

## 2017-05-07 LAB — IBC PANEL
IRON: 54 ug/dL (ref 42–165)
Saturation Ratios: 14.8 % — ABNORMAL LOW (ref 20.0–50.0)
Transferrin: 260 mg/dL (ref 212.0–360.0)

## 2017-05-07 LAB — URIC ACID: Uric Acid, Serum: 5.1 mg/dL (ref 4.0–7.8)

## 2017-05-07 LAB — VITAMIN B12: Vitamin B-12: 1143 pg/mL — ABNORMAL HIGH (ref 211–911)

## 2017-05-07 LAB — TSH: TSH: 1.9 u[IU]/mL (ref 0.35–4.50)

## 2017-05-07 MED ORDER — ALLOPURINOL 300 MG PO TABS: ORAL_TABLET | ORAL | 3 refills | Status: DC

## 2017-05-07 MED ORDER — COLCHICINE 0.6 MG PO CAPS: 1.0000 | ORAL_CAPSULE | Freq: Two times a day (BID) | ORAL | 5 refills | Status: DC

## 2017-05-07 MED ORDER — METHYLPREDNISOLONE 4 MG PO TBPK: ORAL_TABLET | ORAL | 0 refills | Status: DC

## 2017-05-07 MED ORDER — LIRAGLUTIDE -WEIGHT MANAGEMENT 18 MG/3ML ~~LOC~~ SOPN: 3.0000 mg | PEN_INJECTOR | Freq: Every day | SUBCUTANEOUS | 5 refills | Status: DC

## 2017-05-07 NOTE — Patient Instructions (Signed)

## 2017-05-07 NOTE — Progress Notes (Signed)
Subjective:  Patient ID: Rickey Hurley, male    DOB: [birth year]  Age: 51 y.o. MRN: [id number]  CC: Hypertension and Anemia   HPI Chisum Habenicht presents for multiple concerns. He contacted me earlier this week while he was in [place] complaining of a sinus infection and has started taking Augmentin. He tells me the sinus symptoms are much better. But now for the last 3 days he has had pain and swelling at the base of his right great toe consistent with his history of gout. He has tried to control the pain with Motrin and Tylenol without much relief from his symptoms. He also needs a follow-up on his lipids as well as chronic anemia. He offers no other complaints today.  Outpatient Medications Prior to Visit  Medication Sig Dispense Refill  . amoxicillin-clavulanate (AUGMENTIN) 875-125 MG tablet Take 1 tablet by mouth 2 (two) times daily. 20 tablet 0  . aspirin 81 MG tablet Take 81 mg by mouth daily.      . Blood Glucose Monitoring Suppl (ONE TOUCH ULTRA 2) w/Device KIT Use to check blood sugars twice a day 1 each 0  . fenofibrate 160 MG tablet TAKE 1 TABLET DAILY 90 tablet 3  . fluticasone (FLONASE) 50 MCG/ACT nasal spray Place 2 sprays into both nostrils daily. 48 g 3  . glucose blood (COOL BLOOD GLUCOSE TEST STRIPS) test strip Use as instructed to test blood sugar twice daily. DX E11.9 200 each 3  . Insulin Pen Needle (NOVOFINE) 32G X 6 MM MISC 1 Act by Does not apply route daily. 100 each 3  . metoprolol tartrate (LOPRESSOR) 25 MG tablet TAKE 1 TABLET TWICE A DAY 180 tablet 3  . Omega-3 Fatty Acids (FISH OIL) 1000 MG CAPS Take by mouth daily.      [name] DELICA LANCETS 16R MISC Use to check blood sugars twice a day 300 each 3  . simvastatin (ZOCOR) 40 MG tablet TAKE 1 TABLET AT BEDTIME 90 tablet 3  . Sulfacetamide Sodium-Sulfur 10-5 % CREA Apply 1 application topically 2 (two) times daily as needed. 120 g 3  . allopurinol (ZYLOPRIM) 300 MG tablet TAKE 1 TABLET DAILY 90 tablet 3  .  diclofenac (VOLTAREN) 75 MG EC tablet Take 1 tablet (75 mg total) by mouth 2 (two) times daily. Yearly physical w/labs due in Sept must see MD for refill 180 tablet 0  . SAXENDA 18 MG/3ML SOPN INJECT 3 MG INTO THE SKIN DAILY 15 pen 5  . Multiple Vitamin (MULTIVITAMIN) tablet Take 1 tablet by mouth daily.      Marland Kitchen sulfamethoxazole-trimethoprim (BACTRIM DS,SEPTRA DS) 800-160 MG tablet Take 1 tablet by mouth 2 (two) times daily. 42 tablet 0   No facility-administered medications prior to visit.     ROS Review of Systems  Constitutional: Negative.  Negative for activity change, chills, diaphoresis, fatigue and fever.  HENT: Negative.  Negative for sinus pressure, sore throat and trouble swallowing.   Eyes: Negative.  Negative for visual disturbance.  Respiratory: Negative for cough, chest tightness, shortness of breath and wheezing.   Cardiovascular: Negative for chest pain, palpitations and leg swelling.  Gastrointestinal: Negative for abdominal pain, blood in stool, constipation, diarrhea, nausea and vomiting.  Endocrine: Negative.   Genitourinary: Negative.  Negative for difficulty urinating, hematuria and urgency.  Musculoskeletal: Positive for arthralgias. Negative for back pain, myalgias and neck pain.  Skin: Negative for color change, pallor and rash.  Neurological: Negative.  Negative for dizziness and weakness.  Hematological:  Negative for adenopathy. Does not bruise/bleed easily.  Psychiatric/Behavioral: Negative.     Objective:  BP 110/80 (BP Location: Left Arm, Patient Position: Sitting, Cuff Size: Large)   Pulse 68   Temp 98.1 F (36.7 C) (Oral)   Resp 16   Ht 5' 11"  (1.803 m)   Wt 272 lb (123.4 kg)   SpO2 99%   BMI 37.94 kg/m   BP Readings from Last 3 Encounters:  05/07/17 110/80  04/14/17 (!) 152/98  06/25/16 132/60    Wt Readings from Last 3 Encounters:  05/07/17 272 lb (123.4 kg)  04/14/17 290 lb (131.5 kg)  06/25/16 (!) 315 lb (142.9 kg)    Physical Exam    Constitutional: He is oriented to person, place, and time. No distress.  HENT:  Mouth/Throat: Oropharynx is clear and moist. No oropharyngeal exudate.  Eyes: Conjunctivae are normal. Right eye exhibits no discharge. Left eye exhibits no discharge. No scleral icterus.  Neck: Normal range of motion. Neck supple. No JVD present. No thyromegaly present.  Cardiovascular: Normal rate, regular rhythm and intact distal pulses.  Exam reveals no gallop.   No murmur heard. Pulmonary/Chest: Effort normal and breath sounds normal. No respiratory distress. He has no wheezes. He has no rales. He exhibits no tenderness.  Abdominal: Soft. Bowel sounds are normal. He exhibits no distension and no mass. There is no tenderness. There is no rebound and no guarding.  Musculoskeletal: Normal range of motion. He exhibits no edema.       Right foot: There is tenderness and swelling. There is normal range of motion, no crepitus and no deformity.       Feet:  Lymphadenopathy:    He has no cervical adenopathy.  Neurological: He is alert and oriented to person, place, and time.  Skin: Skin is warm and dry. No rash noted. He is not diaphoretic. No erythema. No pallor.  Psychiatric: He has a normal mood and affect. His behavior is normal. Judgment and thought content normal.  Vitals reviewed.   Lab Results  Component Value Date   WBC 8.3 05/07/2017   HGB 13.2 05/07/2017   HCT 38.8 (L) 05/07/2017   PLT 485.0 (H) 05/07/2017   GLUCOSE 93 05/07/2017   CHOL 139 05/07/2017   TRIG 210.0 (H) 05/07/2017   HDL 26.80 (L) 05/07/2017   LDLDIRECT 78.0 05/07/2017   LDLCALC 97 01/30/2014   ALT 150 (H) 05/07/2017   AST 46 (H) 05/07/2017   NA 138 05/07/2017   K 4.8 05/07/2017   CL 101 05/07/2017   CREATININE 1.09 05/07/2017   BUN 20 05/07/2017   CO2 27 05/07/2017   TSH 1.90 05/07/2017   PSA 0.35 06/18/2016   HGBA1C 5.7 05/07/2017    No results found.  Assessment & Plan:   Tayvien was seen today for hypertension and  anemia.  Diagnoses and all orders for this visit:  Essential hypertension- his blood pressure is well-controlled, electrolytes and renal function are normal. -     Comprehensive metabolic panel; Future -     CBC with Differential/Platelet; Future -     TSH; Future -     Urinalysis, Routine w reflex microscopic; Future  Prediabetes- his A1c is down to 5.7% on the GLP-1 agonist, will continue this in addition to lifestyle modifications. -     Comprehensive metabolic panel; Future -     Hemoglobin A1c; Future  Idiopathic gout, unspecified chronicity, unspecified site -     Uric acid; Future -  Colchicine (MITIGARE) 0.6 MG CAPS; Take 1 tablet by mouth 2 (two) times daily.  Hyperlipidemia with target LDL less than 130 -     Lipid panel; Future  Hyperglycemia -     Liraglutide -Weight Management (SAXENDA) 18 MG/3ML SOPN; Inject 3 mg into the skin daily.  Morbid obesity due to excess calories ([place])- improvement noted, will continue Saxenda -     Liraglutide -Weight Management (SAXENDA) 18 MG/3ML SOPN; Inject 3 mg into the skin daily.  Idiopathic chronic gout of multiple sites without tophus- he is having an acute flare in the right first MTP joint so will treat with a course of systemic steroids, will continue allopurinol at the current dose, will also try a course of colchicine -     methylPREDNISolone (MEDROL DOSEPAK) 4 MG TBPK tablet; TAKE AS DIRECTED -     allopurinol (ZYLOPRIM) 300 MG tablet; TAKE 1 TABLET DAILY  Deficiency anemia- his hematocrit is slightly low, his MCV and MCH are normal, his platelet count is slightly elevated but his B12 and iron levels are normal. Will continue to monitor this in the future with no intervention at this time. -     Vitamin B12; Future -     IBC panel; Future -     Ferritin; Future -     Folate; Future   I have discontinued Mr. Lamp multivitamin, diclofenac, and sulfamethoxazole-trimethoprim. I have also changed his SAXENDA to  Liraglutide -Weight Management. Additionally, I am having him start on Colchicine and methylPREDNISolone. Lastly, I am having him maintain his aspirin, Fish Oil, Sulfacetamide Sodium-Sulfur, simvastatin, metoprolol tartrate, fenofibrate, fluticasone, ONETOUCH DELICA LANCETS 43Q, ONE TOUCH ULTRA 2, Insulin Pen Needle, glucose blood, amoxicillin-clavulanate, and allopurinol.  Meds ordered this encounter  Medications  . Liraglutide -Weight Management (SAXENDA) 18 MG/3ML SOPN    Sig: Inject 3 mg into the skin daily.    Dispense:  15 pen    Refill:  5  . Colchicine (MITIGARE) 0.6 MG CAPS    Sig: Take 1 tablet by mouth 2 (two) times daily.    Dispense:  60 capsule    Refill:  5  . methylPREDNISolone (MEDROL DOSEPAK) 4 MG TBPK tablet    Sig: TAKE AS DIRECTED    Dispense:  21 tablet    Refill:  0  . allopurinol (ZYLOPRIM) 300 MG tablet    Sig: TAKE 1 TABLET DAILY    Dispense:  90 tablet    Refill:  3     Follow-up: Return in about 4 weeks (around 06/04/2017).  Scarlette Calico, MD

## 2017-06-28 ENCOUNTER — Encounter: Payer: Self-pay | Admitting: Internal Medicine

## 2017-06-28 DIAGNOSIS — I10 Essential (primary) hypertension: Secondary | ICD-10-CM

## 2017-06-28 DIAGNOSIS — E785 Hyperlipidemia, unspecified: Secondary | ICD-10-CM

## 2017-06-28 DIAGNOSIS — R739 Hyperglycemia, unspecified: Secondary | ICD-10-CM

## 2017-06-28 DIAGNOSIS — I491 Atrial premature depolarization: Secondary | ICD-10-CM

## 2017-06-28 DIAGNOSIS — M1A09X0 Idiopathic chronic gout, multiple sites, without tophus (tophi): Secondary | ICD-10-CM

## 2017-06-29 MED ORDER — ALLOPURINOL 300 MG PO TABS: 300.0000 mg | ORAL_TABLET | Freq: Every day | ORAL | 3 refills | Status: DC

## 2017-06-29 MED ORDER — METOPROLOL TARTRATE 25 MG PO TABS: 25.0000 mg | ORAL_TABLET | Freq: Two times a day (BID) | ORAL | 3 refills | Status: DC

## 2017-06-29 MED ORDER — LIRAGLUTIDE -WEIGHT MANAGEMENT 18 MG/3ML ~~LOC~~ SOPN: 3.0000 mg | PEN_INJECTOR | Freq: Every day | SUBCUTANEOUS | 5 refills | Status: DC

## 2017-06-29 MED ORDER — FENOFIBRATE 160 MG PO TABS: 160.0000 mg | ORAL_TABLET | Freq: Every day | ORAL | 3 refills | Status: DC

## 2017-06-29 MED ORDER — SIMVASTATIN 40 MG PO TABS: 40.0000 mg | ORAL_TABLET | Freq: Every day | ORAL | 3 refills | Status: DC

## 2017-07-14 ENCOUNTER — Other Ambulatory Visit (INDEPENDENT_AMBULATORY_CARE_PROVIDER_SITE_OTHER): Payer: BC Managed Care – PPO

## 2017-07-14 ENCOUNTER — Ambulatory Visit (INDEPENDENT_AMBULATORY_CARE_PROVIDER_SITE_OTHER): Payer: BC Managed Care – PPO | Admitting: Internal Medicine

## 2017-07-14 ENCOUNTER — Encounter: Payer: Self-pay | Admitting: Internal Medicine

## 2017-07-14 VITALS — BP 128/80 | HR 72 | Temp 98.8°F | Resp 16 | Ht 71.0 in | Wt 284.0 lb

## 2017-07-14 DIAGNOSIS — L089 Local infection of the skin and subcutaneous tissue, unspecified: Secondary | ICD-10-CM

## 2017-07-14 DIAGNOSIS — D539 Nutritional anemia, unspecified: Secondary | ICD-10-CM

## 2017-07-14 DIAGNOSIS — R945 Abnormal results of liver function studies: Secondary | ICD-10-CM

## 2017-07-14 DIAGNOSIS — L723 Sebaceous cyst: Secondary | ICD-10-CM | POA: Diagnosis not present

## 2017-07-14 DIAGNOSIS — R7989 Other specified abnormal findings of blood chemistry: Secondary | ICD-10-CM

## 2017-07-14 LAB — HEPATIC FUNCTION PANEL
ALK PHOS: 47 U/L (ref 39–117)
ALT: 27 U/L (ref 0–53)
AST: 21 U/L (ref 0–37)
Albumin: 3.9 g/dL (ref 3.5–5.2)
BILIRUBIN DIRECT: 0.1 mg/dL (ref 0.0–0.3)
BILIRUBIN TOTAL: 0.4 mg/dL (ref 0.2–1.2)
Total Protein: 6.9 g/dL (ref 6.0–8.3)

## 2017-07-14 LAB — FERRITIN: Ferritin: 117.5 ng/mL (ref 22.0–322.0)

## 2017-07-14 LAB — CBC WITH DIFFERENTIAL/PLATELET
BASOS ABS: 0.1 10*3/uL (ref 0.0–0.1)
BASOS PCT: 1.1 % (ref 0.0–3.0)
Eosinophils Absolute: 0.3 10*3/uL (ref 0.0–0.7)
Eosinophils Relative: 3.7 % (ref 0.0–5.0)
HEMATOCRIT: 39.7 % (ref 39.0–52.0)
Hemoglobin: 13.5 g/dL (ref 13.0–17.0)
LYMPHS ABS: 2.3 10*3/uL (ref 0.7–4.0)
LYMPHS PCT: 33.6 % (ref 12.0–46.0)
MCHC: 34.1 g/dL (ref 30.0–36.0)
MCV: 90.6 fl (ref 78.0–100.0)
MONOS PCT: 8.9 % (ref 3.0–12.0)
Monocytes Absolute: 0.6 10*3/uL (ref 0.1–1.0)
NEUTROS ABS: 3.7 10*3/uL (ref 1.4–7.7)
NEUTROS PCT: 52.7 % (ref 43.0–77.0)
PLATELETS: 289 10*3/uL (ref 150.0–400.0)
RBC: 4.38 Mil/uL (ref 4.22–5.81)
RDW: 14 % (ref 11.5–15.5)
WBC: 7 10*3/uL (ref 4.0–10.5)

## 2017-07-14 LAB — IBC PANEL
Iron: 79 ug/dL (ref 42–165)
Saturation Ratios: 18.9 % — ABNORMAL LOW (ref 20.0–50.0)
Transferrin: 298 mg/dL (ref 212.0–360.0)

## 2017-07-14 LAB — VITAMIN B12: Vitamin B-12: 313 pg/mL (ref 211–911)

## 2017-07-14 LAB — FOLATE: Folate: >23.9 ng/mL (ref >5.9)

## 2017-07-14 MED ORDER — SULFAMETHOXAZOLE-TRIMETHOPRIM 800-160 MG PO TABS: 1.0000 | ORAL_TABLET | Freq: Two times a day (BID) | ORAL | 0 refills | Status: AC

## 2017-07-14 NOTE — Patient Instructions (Signed)
Epidermal Cyst An epidermal cyst is sometimes called an epidermal inclusion cyst or an infundibular cyst. It is a sac made of skin tissue. The sac contains a substance called keratin. Keratin is a protein that is normally secreted through the hair follicles. When keratin becomes trapped in the top layer of skin (epidermis), it can form an epidermal cyst. Epidermal cysts are usually found on the face, neck, trunk, and genitals. These cysts are usually harmless (benign), and they may not cause symptoms unless they become infected. It is important not to pop epidermal cysts yourself. What are the causes? This condition may be caused by:  A blocked hair follicle.  A hair that curls and re-enters the skin instead of growing straight out of the skin (ingrown hair).  A blocked pore.  Irritated skin.  An injury to the skin.  Certain conditions that are passed along from parent to child (inherited).  Human papillomavirus (HPV).  What increases the risk? The following factors may make you more likely to develop an epidermal cyst:  Having acne.  Being overweight.  Wearing tight clothing.  What are the signs or symptoms? The only symptom of this condition may be a small, painless lump underneath the skin. When an epidermal cyst becomes infected, symptoms may include:  Redness.  Inflammation.  Tenderness.  Warmth.  Fever.  Keratin draining from the cyst. Keratin may look like a grayish-white, bad-smelling substance.  Pus draining from the cyst.  How is this diagnosed? This condition is diagnosed with a physical exam. In some cases, you may have a sample of tissue (biopsy) taken from your cyst to be examined under a microscope or tested for bacteria. You may be referred to a health care provider who specializes in skin care (dermatologist). How is this treated? In many cases, epidermal cysts go away on their own without treatment. If a cyst becomes infected, treatment may  include:  Opening and draining the cyst. After draining, minor surgery to remove the rest of the cyst may be done.  Antibiotic medicine to help prevent infection.  Injections of medicines (steroids) that help to reduce inflammation.  Surgery to remove the cyst. Surgery may be done if: ? The cyst becomes large. ? The cyst bothers you. ? There is a chance that the cyst could turn into cancer.  Follow these instructions at home:  Take over-the-counter and prescription medicines only as told by your health care provider.  If you were prescribed an antibiotic, use it as told by your health care provider. Do not stop using the antibiotic even if you start to feel better.  Keep the area around your cyst clean and dry.  Wear loose, dry clothing.  Do not try to pop your cyst.  Avoid touching your cyst.  Check your cyst every day for signs of infection.  Keep all follow-up visits as told by your health care provider. This is important. How is this prevented?  Wear clean, dry, clothing.  Avoid wearing tight clothing.  Keep your skin clean and dry. Shower or take baths every day.  Wash your body with a benzoyl peroxide wash when you shower or bathe. Contact a health care provider if:  Your cyst develops symptoms of infection.  Your condition is not improving or is getting worse.  You develop a cyst that looks different from other cysts you have had.  You have a fever. Get help right away if:  Redness spreads from the cyst into the surrounding area. This information is   not intended to replace advice given to you by your health care provider. Make sure you discuss any questions you have with your health care provider. Document Released: 10/11/2004 Document Revised: 07/09/2016 Document Reviewed: 09/12/2015 Elsevier Interactive Patient Education  2018 Elsevier Inc.  

## 2017-07-14 NOTE — Progress Notes (Signed)
Subjective:  Patient ID: Rickey Hurley, male    DOB: 02/16/1966  Age: 51 y.o. MRN: [id number]  CC: Anemia   HPI Rickey Hurley presents for a f/up on anemia - he complains of a 6 day hx of pain/swelling over his left posterior scalp,  Outpatient Medications Prior to Visit  Medication Sig Dispense Refill  . allopurinol (ZYLOPRIM) 300 MG tablet Take 1 tablet (300 mg total) by mouth daily. 90 tablet 3  . aspirin 81 MG tablet Take 81 mg by mouth daily.      . Blood Glucose Monitoring Suppl (ONE TOUCH ULTRA 2) w/Device KIT Use to check blood sugars twice a day 1 each 0  . Colchicine (MITIGARE) 0.6 MG CAPS Take 1 tablet by mouth 2 (two) times daily. 60 capsule 5  . fenofibrate 160 MG tablet Take 1 tablet (160 mg total) by mouth daily. 90 tablet 3  . fluticasone (FLONASE) 50 MCG/ACT nasal spray Place 2 sprays into both nostrils daily. 48 g 3  . glucose blood (COOL BLOOD GLUCOSE TEST STRIPS) test strip Use as instructed to test blood sugar twice daily. DX E11.9 200 each 3  . Insulin Pen Needle (NOVOFINE) 32G X 6 MM MISC 1 Act by Does not apply route daily. 100 each 3  . Liraglutide -Weight Management (SAXENDA) 18 MG/3ML SOPN Inject 3 mg into the skin daily. 15 pen 5  . metoprolol tartrate (LOPRESSOR) 25 MG tablet Take 1 tablet (25 mg total) by mouth 2 (two) times daily. 180 tablet 3  . Omega-3 Fatty Acids (FISH OIL) 1000 MG CAPS Take by mouth daily.      Rickey Hurley DELICA LANCETS 95A MISC Use to check blood sugars twice a day 300 each 3  . simvastatin (ZOCOR) 40 MG tablet Take 1 tablet (40 mg total) by mouth daily. 90 tablet 3  . Sulfacetamide Sodium-Sulfur 10-5 % CREA Apply 1 application topically 2 (two) times daily as needed. 120 g 3  . methylPREDNISolone (MEDROL DOSEPAK) 4 MG TBPK tablet TAKE AS DIRECTED 21 tablet 0   No facility-administered medications prior to visit.     ROS Review of Systems  Constitutional: Negative.  Negative for chills, diaphoresis, fatigue and fever.  HENT: Negative.   Negative for sore throat and trouble swallowing.   Eyes: Negative.   Respiratory: Negative.  Negative for cough, chest tightness, shortness of breath, wheezing and stridor.   Cardiovascular: Negative for chest pain, palpitations and leg swelling.  Gastrointestinal: Negative for abdominal pain, constipation, diarrhea, nausea and vomiting.  Endocrine: Negative.   Genitourinary: Negative.  Negative for difficulty urinating.  Musculoskeletal: Negative.  Negative for back pain, myalgias and neck pain.  Skin: Negative for color change, pallor and rash.  Allergic/Immunologic: Negative.   Neurological: Negative.  Negative for dizziness, weakness and headaches.  Hematological: Positive for adenopathy. Does not bruise/bleed easily.  Psychiatric/Behavioral: Negative.     Objective:  BP 128/80 (BP Location: Left Arm, Patient Position: Sitting, Cuff Size: Large)   Pulse 72   Temp 98.8 F (37.1 C) (Oral)   Resp 16   Ht _0  (1.803 m)   Wt 284 lb (128.8 kg)   SpO2 99%   BMI 39.61 kg/m   BP Readings from Last 3 Encounters:  07/14/17 128/80  05/07/17 110/80  04/14/17 (!) 152/98    Wt Readings from Last 3 Encounters:  07/14/17 284 lb (128.8 kg)  05/07/17 272 lb (123.4 kg)  04/14/17 290 lb (131.5 kg)    Physical Exam  Constitutional:  He is oriented to person, place, and time. No distress.  HENT:  Mouth/Throat: Oropharynx is clear and moist. No oropharyngeal exudate.  Eyes: Conjunctivae are normal. Right eye exhibits no discharge. Left eye exhibits no discharge. No scleral icterus.  Neck: Normal range of motion. Neck supple. No JVD present. No thyromegaly present.    Cardiovascular: Normal rate, regular rhythm and intact distal pulses.  Exam reveals no gallop and no friction rub.   No murmur heard. Pulmonary/Chest: Effort normal and breath sounds normal. No respiratory distress. He has no wheezes. He has no rales. He exhibits no tenderness.  Abdominal: Soft. Bowel sounds are normal.  He exhibits no distension and no mass. There is no tenderness. There is no rebound and no guarding.  Musculoskeletal: Normal range of motion. He exhibits no edema, tenderness or deformity.  Lymphadenopathy:    He has no cervical adenopathy.  Neurological: He is alert and oriented to person, place, and time.  Skin: Skin is warm and dry. No rash noted. Rash is not maculopapular, not pustular and not vesicular. He is not diaphoretic. No erythema. No pallor.  Psychiatric: He has a normal mood and affect. His behavior is normal. Judgment and thought content normal.  Vitals reviewed.   Lab Results  Component Value Date   WBC 7.0 07/14/2017   HGB 13.5 07/14/2017   HCT 39.7 07/14/2017   PLT 289.0 07/14/2017   GLUCOSE 93 05/07/2017   CHOL 139 05/07/2017   TRIG 210.0 (H) 05/07/2017   HDL 26.80 (L) 05/07/2017   LDLDIRECT 78.0 05/07/2017   LDLCALC 97 01/30/2014   ALT 27 07/14/2017   AST 21 07/14/2017   NA 138 05/07/2017   K 4.8 05/07/2017   CL 101 05/07/2017   CREATININE 1.09 05/07/2017   BUN 20 05/07/2017   CO2 27 05/07/2017   TSH 1.90 05/07/2017   PSA 0.35 06/18/2016   HGBA1C 5.7 05/07/2017    No results found.  Assessment & Plan:   Rickey Hurley was seen today for anemia.  Diagnoses and all orders for this visit:  Infected sebaceous cyst- There is no formed abscess that would benefit from being incised and drained. Will empirically treat with a course of Bactrim DS. -     sulfamethoxazole-trimethoprim (BACTRIM DS,SEPTRA DS) 800-160 MG tablet; Take 1 tablet by mouth 2 (two) times daily.  Deficiency anemia- his H&H are normal now. Vitamin levels are all within normal limits. -     Vitamin B12; Future -     CBC with Differential/Platelet; Future -     IBC panel; Future -     Ferritin; Future -     Folate; Future  Elevated LFTs- his LFTs are normal now. This is consistent with fatty liver disease. -     Hepatitis A antibody, total; Future -     Hepatic function panel; Future   I  have discontinued Rickey Hurley methylPREDNISolone. I am also having him start on sulfamethoxazole-trimethoprim. Additionally, I am having him maintain his aspirin, Fish Oil, Sulfacetamide Sodium-Sulfur, fluticasone, ONETOUCH DELICA LANCETS 78M, ONE TOUCH ULTRA 2, Insulin Pen Needle, glucose blood, Colchicine, simvastatin, fenofibrate, Liraglutide -Weight Management, metoprolol tartrate, and allopurinol.  Meds ordered this encounter  Medications  . sulfamethoxazole-trimethoprim (BACTRIM DS,SEPTRA DS) 800-160 MG tablet    Sig: Take 1 tablet by mouth 2 (two) times daily.    Dispense:  20 tablet    Refill:  0     Follow-up: Return in about 1 week (around 07/21/2017).  Scarlette Calico, MD

## 2017-07-15 ENCOUNTER — Encounter: Payer: Self-pay | Admitting: Internal Medicine

## 2017-07-15 LAB — HEPATITIS A ANTIBODY, TOTAL: HEP A TOTAL AB: REACTIVE — AB

## 2017-08-11 ENCOUNTER — Encounter: Payer: Self-pay | Admitting: Internal Medicine

## 2017-08-11 ENCOUNTER — Other Ambulatory Visit: Payer: Self-pay | Admitting: Internal Medicine

## 2017-08-11 MED ORDER — ZOSTER VAC RECOMB ADJUVANTED 50 MCG/0.5ML IM SUSR: 0.5000 mL | Freq: Once | INTRAMUSCULAR | 1 refills | Status: AC

## 2017-09-30 ENCOUNTER — Encounter: Payer: Self-pay | Admitting: Internal Medicine

## 2017-09-30 ENCOUNTER — Emergency Department (HOSPITAL_COMMUNITY): Payer: BC Managed Care – PPO

## 2017-09-30 ENCOUNTER — Emergency Department (HOSPITAL_COMMUNITY)
Admission: EM | Admit: 2017-09-30 | Discharge: 2017-09-30 | Disposition: A | Discharge status: Home / Self Care | Payer: BC Managed Care – PPO | Attending: Emergency Medicine | Admitting: Emergency Medicine

## 2017-09-30 ENCOUNTER — Encounter (HOSPITAL_COMMUNITY): Payer: Self-pay

## 2017-09-30 DIAGNOSIS — I309 Acute pericarditis, unspecified: Secondary | ICD-10-CM | POA: Diagnosis not present

## 2017-09-30 DIAGNOSIS — Z7982 Long term (current) use of aspirin: Secondary | ICD-10-CM | POA: Insufficient documentation

## 2017-09-30 DIAGNOSIS — R0602 Shortness of breath: Secondary | ICD-10-CM | POA: Diagnosis present

## 2017-09-30 DIAGNOSIS — I319 Disease of pericardium, unspecified: Secondary | ICD-10-CM

## 2017-09-30 DIAGNOSIS — Z794 Long term (current) use of insulin: Secondary | ICD-10-CM | POA: Insufficient documentation

## 2017-09-30 DIAGNOSIS — I10 Essential (primary) hypertension: Secondary | ICD-10-CM | POA: Insufficient documentation

## 2017-09-30 LAB — BASIC METABOLIC PANEL
Anion gap: 9 (ref 5–15)
BUN: 15 mg/dL (ref 6–20)
CO2: 26 mmol/L (ref 22–32)
Calcium: 9.5 mg/dL (ref 8.9–10.3)
Chloride: 106 mmol/L (ref 101–111)
Creatinine, Ser: 0.8 mg/dL (ref 0.61–1.24)
GFR calc Af Amer: >60 mL/min (ref >60)
GFR calc non Af Amer: >60 mL/min (ref >60)
Glucose, Bld: 104 mg/dL — ABNORMAL HIGH (ref 65–99)
Potassium: 3.8 mmol/L (ref 3.5–5.1)
Sodium: 141 mmol/L (ref 135–145)

## 2017-09-30 LAB — I-STAT TROPONIN, ED: Troponin i, poc: 0 ng/mL (ref 0.00–0.08)

## 2017-09-30 LAB — CBC
HCT: 38.3 % — ABNORMAL LOW (ref 39.0–52.0)
Hemoglobin: 13.5 g/dL (ref 13.0–17.0)
MCH: 30.8 pg (ref 26.0–34.0)
MCHC: 35.2 g/dL (ref 30.0–36.0)
MCV: 87.2 fL (ref 78.0–100.0)
Platelets: 264 10*3/uL (ref 150–400)
RBC: 4.39 MIL/uL (ref 4.22–5.81)
RDW: 13.3 % (ref 11.5–15.5)
WBC: 11 10*3/uL — ABNORMAL HIGH (ref 4.0–10.5)

## 2017-09-30 LAB — D-DIMER, QUANTITATIVE (NOT AT ARMC): D-Dimer, Quant: <0.27 ug/mL-FEU (ref 0.00–0.50)

## 2017-09-30 NOTE — ED Provider Notes (Signed)
[facility] DEPT Provider Note   CSN: [id number] Arrival date & time: 09/30/17  1921     History   Chief Complaint Chief Complaint  Patient presents with  . Shortness of Breath    HPI Rickey Hurley is a 51 y.o. male.  HPI   Right calf pain, CP and dyspnea.  Patient is concerned for DVT.  She reports recent significant travel history via plane.  He states that he has been having several weeks of atraumatic right calf pain.  He states that this spontaneously resolved and then he started feeling short of breath.  He is concerned that he may have had a DVT which then progressed to a PE.  He describes the chest pain is sharp.  Sometimes worse with deep breathing and certain movements.  Worse when supine and improves when sitting forward.  No cough.  No fevers or chills.  Past Medical History:  Diagnosis Date  . Anal fistula 12/21/2009  . CLOSTRIDIUM DIFFICILE COLITIS 04/19/2010  . COLONIC POLYPS, HX OF 12/21/2009  . CONJUNCTIVITIS, ACUTE 04/11/2010  . GOUT 12/21/2009  . H/O viral pericarditis   . HYPERLIPIDEMIA 12/21/2009  . HYPERTENSION 12/21/2009  . [facility] 12/21/2009  . SINUSITIS- ACUTE-NOS 04/11/2010    Patient Active Problem List   Diagnosis Date Noted  . Infected sebaceous cyst 07/14/2017  . Elevated LFTs 07/14/2017  . Morbid obesity due to excess calories ([place]) 06/19/2016  . Other seasonal allergic rhinitis 06/18/2016  . Prediabetes 06/18/2016  . Chronic left hip pain 09/26/2015  . Left lumbar radiculitis 08/22/2015  . Deficiency anemia 05/02/2014  . Asthma, mild intermittent 05/02/2014  . Preventative health care 04/04/2011  . Hyperlipidemia with target LDL less than 130 12/21/2009  . Gout 12/21/2009  . Essential hypertension 12/21/2009  . [facility] 12/21/2009  . Anal fistula 12/21/2009  . COLONIC POLYPS, HX OF 12/21/2009    Past Surgical History:  Procedure Laterality Date  . anal lateral inferior sphincterotomy from c diff    . right ankle ORIF          Home Medications    Prior to Admission medications   Medication Sig Start Date End Date Taking? Authorizing Provider  allopurinol (ZYLOPRIM) 300 MG tablet Take 1 tablet (300 mg total) by mouth daily. 06/29/17   Janith Lima, MD  aspirin 81 MG tablet Take 81 mg by mouth daily.      [provider]  Blood Glucose Monitoring Suppl (ONE TOUCH ULTRA 2) w/Device KIT Use to check blood sugars twice a day 06/23/16   Janith Lima, MD  Colchicine (MITIGARE) 0.6 MG CAPS Take 1 tablet by mouth 2 (two) times daily. 05/07/17   Janith Lima, MD  fenofibrate 160 MG tablet Take 1 tablet (160 mg total) by mouth daily. 06/29/17   Janith Lima, MD  fluticasone (FLONASE) 50 MCG/ACT nasal spray Place 2 sprays into both nostrils daily. 06/19/16   Janith Lima, MD  glucose blood (COOL BLOOD GLUCOSE TEST STRIPS) test strip Use as instructed to test blood sugar twice daily. DX E11.9 09/01/16   Janith Lima, MD  Insulin Pen Needle (NOVOFINE) 32G X 6 MM MISC 1 Act by Does not apply route daily. 06/25/16   Janith Lima, MD  Liraglutide -Weight Management (SAXENDA) 18 MG/3ML SOPN Inject 3 mg into the skin daily. 06/29/17   Janith Lima, MD  metoprolol tartrate (LOPRESSOR) 25 MG tablet Take 1 tablet (25 mg total) by mouth 2 (two) times  daily. 06/29/17   Janith Lima, MD  Omega-3 Fatty Acids (FISH OIL) 1000 MG CAPS Take by mouth daily.      [provider]  [facility] DELICA LANCETS 44W MISC Use to check blood sugars twice a day 06/23/16   Janith Lima, MD  simvastatin (ZOCOR) 40 MG tablet Take 1 tablet (40 mg total) by mouth daily. 06/29/17   Janith Lima, MD  Sulfacetamide Sodium-Sulfur 10-5 % CREA Apply 1 application topically 2 (two) times daily as needed. 05/02/14   Janith Lima, MD    Family History Family History  Adopted: Yes  Problem Relation Age of Onset  . Cancer Neg Hx   . Diabetes Neg Hx   . Early death Neg Hx   . Hearing loss Neg Hx   . Heart disease Neg Hx     . Hyperlipidemia Neg Hx   . Hypertension Neg Hx   . Stroke Neg Hx     Social History Social History   Tobacco Use  . Smoking status: Never Smoker  . Smokeless tobacco: Never Used  Substance Use Topics  . Alcohol use: Yes    Alcohol/week: 2.4 oz    Types: 4 Shots of liquor per week  . Drug use: No     Allergies   Lisinopril and Moxifloxacin   Review of Systems Review of Systems  All systems reviewed and negative, other than as noted in HPI.  Physical Exam Updated Vital Signs BP (!) (P) 160/107 (BP Location: Left Arm)   Pulse (P) 97   Temp (P) 98.8 F (37.1 C) (Oral)   Resp (P) 18   SpO2 (P) 98%   Physical Exam  Constitutional: He appears well-developed and well-nourished. No distress.  HENT:  Head: Normocephalic and atraumatic.  Eyes: Conjunctivae are normal. Right eye exhibits no discharge. Left eye exhibits no discharge.  Neck: Neck supple.  Cardiovascular: Normal rate, regular rhythm and normal heart sounds. Exam reveals no gallop and no friction rub.  No murmur heard. Pulmonary/Chest: Effort normal and breath sounds normal. No respiratory distress.  Abdominal: Soft. He exhibits no distension. There is no tenderness.  Musculoskeletal: He exhibits no edema or tenderness.  Lower extremities symmetric as compared to each other. No calf tenderness. Negative Homan's. No palpable cords.   Neurological: He is alert.  Skin: Skin is warm and dry.  Psychiatric: He has a normal mood and affect. His behavior is normal. Thought content normal.  Nursing note and vitals reviewed.    ED Treatments / Results  Labs (all labs ordered are listed, but only abnormal results are displayed) Labs Reviewed  BASIC METABOLIC PANEL - Abnormal; Notable for the following components:      Result Value   Glucose, Bld 104 (*)    All other components within normal limits  CBC - Abnormal; Notable for the following components:   WBC 11.0 (*)    HCT 38.3 (*)    All other components  within normal limits  D-DIMER, QUANTITATIVE (NOT AT [facility])  I-STAT TROPONIN, ED    EKG  EKG Interpretation  Date/Time:  Wednesday September 30 2017 19:39:59 EST Ventricular Rate:  95 PR Interval:    QRS Duration: 91 QT Interval:  361 QTC Calculation: 454 R Axis:   -8 Text Interpretation:  Sinus rhythm Abnormal R-wave progression, late transition Left ventricular hypertrophy Baseline wander in lead(s) V2 Confirmed by Virgel Manifold [id number]) on 09/30/2017 9:06:59 PM       Radiology Dg Chest 2 View  Result Date: 09/30/2017 CLINICAL DATA:  Acute chest pain and shortness of breath today. EXAM: CHEST  2 VIEW COMPARISON:  05/14/2012 FINDINGS: This is a low volume film. Cardiomediastinal silhouette is unremarkable. Mild peribronchial thickening again noted. There is no evidence of focal airspace disease, pulmonary edema, suspicious pulmonary nodule/mass, pleural effusion, or pneumothorax. No acute bony abnormalities are identified. IMPRESSION: No evidence of acute cardiopulmonary disease. Electronically Signed   By: Margarette Canada M.D.   On: 09/30/2017 20:47    Procedures Procedures (including critical care time)  Medications Ordered in ED Medications - No data to display   Initial Impression / Assessment and Plan / ED Course  I have reviewed the triage vital signs and the nursing notes.  Pertinent labs & imaging results that were available during my care of the patient were reviewed by me and considered in my medical decision making (see chart for details).     51 year old male with chest pain and dyspnea.  I doubt ACS.  Very typical symptoms.  Normal d-dimer.  Very low suspicion for PE.  May potentially be pericarditis given the positional component and sharp in nature.  Unremarkable cardiac silhouette.  If this is indeed pericarditis, I doubt significant effusion.  Recommended NSAIDs.  He says that here he has colchicine for gout.  Discussed dosing.  Return precautions were  discussed. Final Clinical Impressions(s) / ED Diagnoses   Final diagnoses:  Pericarditis, unspecified chronicity, unspecified type    ED Discharge Orders    None       Virgel Manifold, MD 10/09/17 1413

## 2017-09-30 NOTE — Telephone Encounter (Signed)
Appointment scheduled and patient was sent to [facility].

## 2017-09-30 NOTE — ED Triage Notes (Signed)
Pt complains of lower leg pain about three weeks ago and after it subsided he became short of breath and having chest pain Pt had been traveling and sitting at a conference prior to the leg pain

## 2017-10-01 ENCOUNTER — Encounter: Payer: Self-pay | Admitting: Nurse Practitioner

## 2017-10-01 ENCOUNTER — Ambulatory Visit: Payer: BC Managed Care – PPO | Admitting: Nurse Practitioner

## 2017-10-01 VITALS — BP 116/80 | HR 65 | Temp 98.4°F | Ht 71.0 in | Wt 296.0 lb

## 2017-10-01 DIAGNOSIS — M940 Chondrocostal junction syndrome [Tietze]: Secondary | ICD-10-CM

## 2017-10-01 MED ORDER — PREDNISONE 10 MG (21) PO TBPK: ORAL_TABLET | ORAL | 0 refills | Status: DC

## 2017-10-01 MED ORDER — KETOROLAC TROMETHAMINE 60 MG/2ML IM SOLN
60.0000 mg | Freq: Once | INTRAMUSCULAR | Status: AC
  Administered 2017-10-01: 60 mg via INTRAMUSCULAR

## 2017-10-01 NOTE — Patient Instructions (Signed)
Costochondritis Costochondritis is swelling and irritation (inflammation) of the tissue (cartilage) that connects your ribs to your breastbone (sternum). This causes pain in the front of your chest. Usually, the pain:  Starts gradually.  Is in more than one rib.  This condition usually goes away on its own over time. Follow these instructions at home:  Do not do anything that makes your pain worse.  If directed, put ice on the painful area: ? Put ice in a plastic bag. ? Place a towel between your skin and the bag. ? Leave the ice on for 20 minutes, 2-3 times a day.  If directed, put heat on the affected area as often as told by your doctor. Use the heat source that your doctor tells you to use, such as a moist heat pack or a heating pad. ? Place a towel between your skin and the heat source. ? Leave the heat on for 20-30 minutes. ? Take off the heat if your skin turns bright red. This is very important if you cannot feel pain, heat, or cold. You may have a greater risk of getting burned.  Take over-the-counter and prescription medicines only as told by your doctor.  Return to your normal activities as told by your doctor. Ask your doctor what activities are safe for you.  Keep all follow-up visits as told by your doctor. This is important. Contact a doctor if:  You have chills or a fever.  Your pain does not go away or it gets worse.  You have a cough that does not go away. Get help right away if:  You are short of breath. This information is not intended to replace advice given to you by your health care provider. Make sure you discuss any questions you have with your health care provider. Document Released: 04/28/2008 Document Revised: 05/30/2016 Document Reviewed: 03/05/2016 Elsevier Interactive Patient Education  2018 [employer] Inc[employer]  

## 2017-10-01 NOTE — Progress Notes (Signed)
Subjective:  Patient ID: Rickey Hurley, male    DOB: [birth year]  Age: 51 y.o. MRN: [id number]  CC: Hospitalization Follow-up (still has pain near chest area,SOB--went to ED last night--test was normal. )   Chest Pain   This is a new problem. The current episode started yesterday. The onset quality is gradual. The problem occurs constantly. The problem has been unchanged. The pain is present in the substernal region. The pain is moderate. The quality of the pain is described as dull. The pain does not radiate. Pertinent negatives include no back pain, claudication, cough, diaphoresis, dizziness, exertional chest pressure, fever, headaches, irregular heartbeat, malaise/fatigue, nausea, numbness, orthopnea, palpitations, PND, shortness of breath, syncope or weakness.   No travel, no injury. No repetitive movement. Does not know FHx (adopted). Evaluated in Ed yesterday (negative cardiac evaluation and negative Ddimer).  Outpatient Medications Prior to Visit  Medication Sig Dispense Refill  . allopurinol (ZYLOPRIM) 300 MG tablet Take 1 tablet (300 mg total) by mouth daily. 90 tablet 3  . aspirin 81 MG tablet Take 81 mg by mouth daily.      . Blood Glucose Monitoring Suppl (ONE TOUCH ULTRA 2) w/Device KIT Use to check blood sugars twice a day 1 each 0  . Colchicine (MITIGARE) 0.6 MG CAPS Take 1 tablet by mouth 2 (two) times daily. 60 capsule 5  . fenofibrate 160 MG tablet Take 1 tablet (160 mg total) by mouth daily. 90 tablet 3  . fluticasone (FLONASE) 50 MCG/ACT nasal spray Place 2 sprays into both nostrils daily. 48 g 3  . glucose blood (COOL BLOOD GLUCOSE TEST STRIPS) test strip Use as instructed to test blood sugar twice daily. DX E11.9 200 each 3  . Insulin Pen Needle (NOVOFINE) 32G X 6 MM MISC 1 Act by Does not apply route daily. 100 each 3  . Liraglutide -Weight Management (SAXENDA) 18 MG/3ML SOPN Inject 3 mg into the skin daily. 15 pen 5  . metoprolol tartrate (LOPRESSOR) 25 MG tablet Take 1  tablet (25 mg total) by mouth 2 (two) times daily. 180 tablet 3  . Omega-3 Fatty Acids (FISH OIL) 1000 MG CAPS Take by mouth daily.      [name] DELICA LANCETS 55V MISC Use to check blood sugars twice a day 300 each 3  . simvastatin (ZOCOR) 40 MG tablet Take 1 tablet (40 mg total) by mouth daily. 90 tablet 3  . Sulfacetamide Sodium-Sulfur 10-5 % CREA Apply 1 application topically 2 (two) times daily as needed. 120 g 3   No facility-administered medications prior to visit.     ROS See HPI  Objective:  BP 116/80   Pulse 65   Temp 98.4 F (36.9 C)   Ht 5' 11"  (1.803 m)   Wt 296 lb (134.3 kg)   SpO2 96%   BMI 41.28 kg/m   BP Readings from Last 3 Encounters:  10/01/17 116/80  09/30/17 (!) 174/118  07/14/17 128/80    Wt Readings from Last 3 Encounters:  10/01/17 296 lb (134.3 kg)  07/14/17 284 lb (128.8 kg)  05/07/17 272 lb (123.4 kg)    Physical Exam  Constitutional: He is oriented to person, place, and time. No distress.  Cardiovascular: Normal rate, regular rhythm and normal heart sounds.  Pulmonary/Chest: Effort normal and breath sounds normal. He exhibits tenderness.  Musculoskeletal: He exhibits no edema.  Neurological: He is alert and oriented to person, place, and time.  Skin: Skin is warm and dry.  Psychiatric: He has  a normal mood and affect. His behavior is normal.  Vitals reviewed.   Lab Results  Component Value Date   WBC 11.0 (H) 09/30/2017   HGB 13.5 09/30/2017   HCT 38.3 (L) 09/30/2017   PLT 264 09/30/2017   GLUCOSE 104 (H) 09/30/2017   CHOL 139 05/07/2017   TRIG 210.0 (H) 05/07/2017   HDL 26.80 (L) 05/07/2017   LDLDIRECT 78.0 05/07/2017   LDLCALC 97 01/30/2014   ALT 27 07/14/2017   AST 21 07/14/2017   NA 141 09/30/2017   K 3.8 09/30/2017   CL 106 09/30/2017   CREATININE 0.80 09/30/2017   BUN 15 09/30/2017   CO2 26 09/30/2017   TSH 1.90 05/07/2017   PSA 0.35 06/18/2016   HGBA1C 5.7 05/07/2017    Dg Chest 2 View  Result Date:  09/30/2017 CLINICAL DATA:  Acute chest pain and shortness of breath today. EXAM: CHEST  2 VIEW COMPARISON:  05/14/2012 FINDINGS: This is a low volume film. Cardiomediastinal silhouette is unremarkable. Mild peribronchial thickening again noted. There is no evidence of focal airspace disease, pulmonary edema, suspicious pulmonary nodule/mass, pleural effusion, or pneumothorax. No acute bony abnormalities are identified. IMPRESSION: No evidence of acute cardiopulmonary disease. Electronically Signed   By: Margarette Canada M.D.   On: 09/30/2017 20:47    Assessment & Plan:   Salah was seen today for hospitalization follow-up.  Diagnoses and all orders for this visit:  Costochondritis, acute -     ketorolac (TORADOL) injection 60 mg -     predniSONE (STERAPRED UNI-PAK 21 TAB) 10 MG (21) TBPK tablet; As directed on package   I am having Oliver Pila start on predniSONE. I am also having him maintain his aspirin, Fish Oil, Sulfacetamide Sodium-Sulfur, fluticasone, ONETOUCH DELICA LANCETS 48O, ONE TOUCH ULTRA 2, Insulin Pen Needle, glucose blood, Colchicine, simvastatin, fenofibrate, Liraglutide -Weight Management, metoprolol tartrate, and allopurinol. We administered ketorolac.  Meds ordered this encounter  Medications  . ketorolac (TORADOL) injection 60 mg  . predniSONE (STERAPRED UNI-PAK 21 TAB) 10 MG (21) TBPK tablet    Sig: As directed on package    Dispense:  21 tablet    Refill:  0    Order Specific Question:   Supervising Provider    Answer:   Cassandria Anger [[id number]]    Follow-up: No Follow-up on file.  Wilfred Lacy, NP

## 2017-10-04 ENCOUNTER — Encounter: Payer: Self-pay | Admitting: Nurse Practitioner

## 2017-10-16 ENCOUNTER — Encounter: Payer: Self-pay | Admitting: Internal Medicine

## 2017-10-19 ENCOUNTER — Other Ambulatory Visit: Payer: Self-pay | Admitting: Internal Medicine

## 2017-10-19 DIAGNOSIS — N410 Acute prostatitis: Secondary | ICD-10-CM

## 2017-10-19 MED ORDER — SULFAMETHOXAZOLE-TRIMETHOPRIM 800-160 MG PO TABS: 1.0000 | ORAL_TABLET | Freq: Two times a day (BID) | ORAL | 1 refills | Status: AC

## 2017-10-21 ENCOUNTER — Other Ambulatory Visit (INDEPENDENT_AMBULATORY_CARE_PROVIDER_SITE_OTHER): Payer: BC Managed Care – PPO

## 2017-10-21 ENCOUNTER — Ambulatory Visit: Payer: BC Managed Care – PPO | Admitting: Internal Medicine

## 2017-10-21 ENCOUNTER — Encounter: Payer: Self-pay | Admitting: Internal Medicine

## 2017-10-21 VITALS — BP 120/80 | HR 59 | Temp 98.8°F | Resp 16 | Ht 71.0 in | Wt 301.5 lb

## 2017-10-21 DIAGNOSIS — D539 Nutritional anemia, unspecified: Secondary | ICD-10-CM | POA: Diagnosis not present

## 2017-10-21 DIAGNOSIS — N410 Acute prostatitis: Secondary | ICD-10-CM

## 2017-10-21 DIAGNOSIS — I301 Infective pericarditis: Secondary | ICD-10-CM | POA: Insufficient documentation

## 2017-10-21 DIAGNOSIS — M25552 Pain in left hip: Secondary | ICD-10-CM | POA: Diagnosis not present

## 2017-10-21 DIAGNOSIS — G8929 Other chronic pain: Secondary | ICD-10-CM

## 2017-10-21 DIAGNOSIS — M5416 Radiculopathy, lumbar region: Secondary | ICD-10-CM | POA: Diagnosis not present

## 2017-10-21 LAB — URINALYSIS, ROUTINE W REFLEX MICROSCOPIC
BILIRUBIN URINE: NEGATIVE
Hgb urine dipstick: NEGATIVE
KETONES UR: NEGATIVE
Leukocytes, UA: NEGATIVE
Nitrite: NEGATIVE
PH: 6 (ref 5.0–8.0)
RBC / HPF: NONE SEEN (ref 0–?)
Specific Gravity, Urine: >=1.03 — AB (ref 1.000–1.030)
Total Protein, Urine: NEGATIVE
UROBILINOGEN UA: 0.2 (ref 0.0–1.0)
Urine Glucose: NEGATIVE
WBC UA: NONE SEEN (ref 0–?)

## 2017-10-21 LAB — CBC WITH DIFFERENTIAL/PLATELET
BASOS PCT: 1.2 % (ref 0.0–3.0)
Basophils Absolute: 0.1 10*3/uL (ref 0.0–0.1)
EOS ABS: 0.2 10*3/uL (ref 0.0–0.7)
Eosinophils Relative: 2.8 % (ref 0.0–5.0)
HCT: 38.9 % — ABNORMAL LOW (ref 39.0–52.0)
HEMOGLOBIN: 13.4 g/dL (ref 13.0–17.0)
Lymphocytes Relative: 36 % (ref 12.0–46.0)
Lymphs Abs: 2.3 10*3/uL (ref 0.7–4.0)
MCHC: 34.3 g/dL (ref 30.0–36.0)
MCV: 90 fl (ref 78.0–100.0)
MONO ABS: 0.6 10*3/uL (ref 0.1–1.0)
Monocytes Relative: 9.4 % (ref 3.0–12.0)
Neutro Abs: 3.2 10*3/uL (ref 1.4–7.7)
Neutrophils Relative %: 50.6 % (ref 43.0–77.0)
Platelets: 318 10*3/uL (ref 150.0–400.0)
RBC: 4.33 Mil/uL (ref 4.22–5.81)
RDW: 13.5 % (ref 11.5–15.5)
WBC: 6.3 10*3/uL (ref 4.0–10.5)

## 2017-10-21 LAB — PSA: PSA: 0.45 ng/mL (ref 0.10–4.00)

## 2017-10-21 LAB — VITAMIN B12: VITAMIN B 12: 316 pg/mL (ref 211–911)

## 2017-10-21 MED ORDER — DICLOFENAC SODIUM 75 MG PO TBEC: 75.0000 mg | DELAYED_RELEASE_TABLET | Freq: Two times a day (BID) | ORAL | 1 refills | Status: DC

## 2017-10-21 MED ORDER — DICLOFENAC SODIUM 75 MG PO TBEC: 75.0000 mg | DELAYED_RELEASE_TABLET | Freq: Two times a day (BID) | ORAL | 0 refills | Status: DC

## 2017-10-21 NOTE — Progress Notes (Signed)
Subjective:  Patient ID: Rickey Hurley, male    DOB: 1966/10/01  Age: 51 y.o. MRN: [id number]  CC: Urinary Tract Infection   HPI Sohan Potvin presents for f/up -he called me 2 days prior to this appointment complaining of dysuria, pelvic pain, urgency and frequency.  He felt like he had another prostate gland infection.  He has since been taking Bactrim DS and tells me the symptoms have resolved.  He also needs follow-up for recent episodes of chest pain.  He was seen in the ED and was diagnosed with pericarditis.  He has been taking an anti-inflammatory and tells me the chest pain has resolved.  He tells me the diclofenac test help with his joint pain as well and he wants to continue taking it.  Outpatient Medications Prior to Visit  Medication Sig Dispense Refill  . allopurinol (ZYLOPRIM) 300 MG tablet Take 1 tablet (300 mg total) by mouth daily. 90 tablet 3  . aspirin 81 MG tablet Take 81 mg by mouth daily.      . Blood Glucose Monitoring Suppl (ONE TOUCH ULTRA 2) w/Device KIT Use to check blood sugars twice a day 1 each 0  . fenofibrate 160 MG tablet Take 1 tablet (160 mg total) by mouth daily. 90 tablet 3  . fluticasone (FLONASE) 50 MCG/ACT nasal spray Place 2 sprays into both nostrils daily. 48 g 3  . glucose blood (COOL BLOOD GLUCOSE TEST STRIPS) test strip Use as instructed to test blood sugar twice daily. DX E11.9 200 each 3  . Insulin Pen Needle (NOVOFINE) 32G X 6 MM MISC 1 Act by Does not apply route daily. 100 each 3  . Liraglutide -Weight Management (SAXENDA) 18 MG/3ML SOPN Inject 3 mg into the skin daily. 15 pen 5  . metoprolol tartrate (LOPRESSOR) 25 MG tablet Take 1 tablet (25 mg total) by mouth 2 (two) times daily. 180 tablet 3  . Omega-3 Fatty Acids (FISH OIL) 1000 MG CAPS Take by mouth daily.      [name] DELICA LANCETS 27P MISC Use to check blood sugars twice a day 300 each 3  . simvastatin (ZOCOR) 40 MG tablet Take 1 tablet (40 mg total) by mouth daily. 90 tablet 3  .  sulfamethoxazole-trimethoprim (BACTRIM DS,SEPTRA DS) 800-160 MG tablet Take 1 tablet by mouth 2 (two) times daily. 60 tablet 1  . Colchicine (MITIGARE) 0.6 MG CAPS Take 1 tablet by mouth 2 (two) times daily. (Patient not taking: Reported on 10/21/2017) 60 capsule 5  . Sulfacetamide Sodium-Sulfur 10-5 % CREA Apply 1 application topically 2 (two) times daily as needed. 120 g 3   No facility-administered medications prior to visit.     ROS Review of Systems  Constitutional: Negative.  Negative for appetite change, chills, diaphoresis, fatigue and fever.  HENT: Negative.   Eyes: Negative.   Respiratory: Negative.  Negative for cough, chest tightness, shortness of breath and wheezing.   Cardiovascular: Negative for chest pain, palpitations and leg swelling.  Gastrointestinal: Positive for anal bleeding. Negative for abdominal pain, constipation, diarrhea, nausea and vomiting.       He rarely notices blood on the toilet paper  Endocrine: Negative.   Genitourinary: Negative.  Negative for difficulty urinating, dysuria, flank pain, frequency, hematuria, penile swelling, scrotal swelling, testicular pain and urgency.  Musculoskeletal: Positive for arthralgias and back pain. Negative for joint swelling and myalgias.  Skin: Negative.  Negative for color change and rash.  Allergic/Immunologic: Negative.   Neurological: Negative.  Negative for dizziness,  weakness, light-headedness and numbness.  Hematological: Negative for adenopathy. Does not bruise/bleed easily.  Psychiatric/Behavioral: Negative.     Objective:  BP 120/80 (BP Location: Left Arm, Patient Position: Sitting, Cuff Size: Large)   Pulse (!) 59   Temp 98.8 F (37.1 C) (Oral)   Resp 16   Ht _0  (1.803 m)   Wt (!) 301 lb 8 oz (136.8 kg)   SpO2 98%   BMI 42.05 kg/m   BP Readings from Last 3 Encounters:  10/21/17 120/80  10/01/17 116/80  09/30/17 (!) 174/118    Wt Readings from Last 3 Encounters:  10/21/17 (!) 301 lb 8 oz  (136.8 kg)  10/01/17 296 lb (134.3 kg)  07/14/17 284 lb (128.8 kg)    Physical Exam  Constitutional: He is oriented to person, place, and time. No distress.  HENT:  Mouth/Throat: Oropharynx is clear and moist. No oropharyngeal exudate.  Eyes: Conjunctivae are normal. Right eye exhibits no discharge. Left eye exhibits no discharge. No scleral icterus.  Neck: Normal range of motion. Neck supple. No JVD present. No thyromegaly present.  Cardiovascular: Normal rate, regular rhythm and normal heart sounds. Exam reveals no gallop and no friction rub.  No murmur heard. EKG -  Sinus  Rhythm  WITHIN NORMAL LIMITS- no change from the prior EKG  Pulmonary/Chest: Effort normal and breath sounds normal. He has no wheezes. He has no rales.  Abdominal: Soft. Bowel sounds are normal. He exhibits no mass. There is no tenderness. There is no guarding. Hernia confirmed negative in the right inguinal area and confirmed negative in the left inguinal area.  Genitourinary: Testes normal and penis normal. Rectal exam shows internal hemorrhoid and guaiac positive stool. Rectal exam shows no external hemorrhoid, no fissure, no mass, no tenderness and anal tone normal. Prostate is enlarged and tender. Right testis shows no mass, no swelling and no tenderness. Right testis is descended. Left testis shows no mass, no swelling and no tenderness. Left testis is descended. Circumcised. No penile tenderness. No discharge found.  Genitourinary Comments: Prostate gland shows 1+ hypertrophy which is symmetrical, there is mild tenderness and bogginess.  Musculoskeletal: Normal range of motion. He exhibits no edema, tenderness or deformity.  Lymphadenopathy:    He has no cervical adenopathy.       Right: No inguinal adenopathy present.       Left: No inguinal adenopathy present.  Neurological: He is alert and oriented to person, place, and time.  Skin: Skin is warm and dry. No rash noted. He is not diaphoretic. No erythema. No  pallor.  Vitals reviewed.   Lab Results  Component Value Date   WBC 6.3 10/21/2017   HGB 13.4 10/21/2017   HCT 38.9 (L) 10/21/2017   PLT 318.0 10/21/2017   GLUCOSE 104 (H) 09/30/2017   CHOL 139 05/07/2017   TRIG 210.0 (H) 05/07/2017   HDL 26.80 (L) 05/07/2017   LDLDIRECT 78.0 05/07/2017   LDLCALC 97 01/30/2014   ALT 27 07/14/2017   AST 21 07/14/2017   NA 141 09/30/2017   K 3.8 09/30/2017   CL 106 09/30/2017   CREATININE 0.80 09/30/2017   BUN 15 09/30/2017   CO2 26 09/30/2017   TSH 1.90 05/07/2017   PSA 0.45 10/21/2017   HGBA1C 5.7 05/07/2017    Dg Chest 2 View  Result Date: 09/30/2017 CLINICAL DATA:  Acute chest pain and shortness of breath today. EXAM: CHEST  2 VIEW COMPARISON:  05/14/2012 FINDINGS: This is a low volume film. Cardiomediastinal silhouette  is unremarkable. Mild peribronchial thickening again noted. There is no evidence of focal airspace disease, pulmonary edema, suspicious pulmonary nodule/mass, pleural effusion, or pneumothorax. No acute bony abnormalities are identified. IMPRESSION: No evidence of acute cardiopulmonary disease. Electronically Signed   By: Margarette Canada M.D.   On: 09/30/2017 20:47    Assessment & Plan:   Haeden was seen today for urinary tract infection.  Diagnoses and all orders for this visit:  Prostatitis, acute- He is responding well to Bactrim DS.  Will continue. -     PSA; Future -     Urinalysis, Routine w reflex microscopic; Future  Acute viral pericarditis-the symptoms have resolved and his EKG remains normal.  We will continue diclofenac as needed. -     EKG 12-Lead -     Discontinue: diclofenac (VOLTAREN) 75 MG EC tablet; Take 1 tablet (75 mg total) by mouth 2 (two) times daily. -     diclofenac (VOLTAREN) 75 MG EC tablet; Take 1 tablet (75 mg total) by mouth 2 (two) times daily.  Deficiency anemia- His H&H are stable.  His rectal bleeding is consistent with hemorrhoids.  Will screen for vitamin B12 deficiency. -     CBC with  Differential/Platelet; Future -     Vitamin B12; Future -     Methylmalonic acid, serum; Future  Left lumbar radiculitis -     diclofenac (VOLTAREN) 75 MG EC tablet; Take 1 tablet (75 mg total) by mouth 2 (two) times daily.  Chronic left hip pain -     diclofenac (VOLTAREN) 75 MG EC tablet; Take 1 tablet (75 mg total) by mouth 2 (two) times daily.   I have discontinued Kealii Pujol's Sulfacetamide Sodium-Sulfur. I am also having him maintain his aspirin, Fish Oil, fluticasone, ONETOUCH DELICA LANCETS 40J, ONE TOUCH ULTRA 2, Insulin Pen Needle, glucose blood, Colchicine, simvastatin, fenofibrate, Liraglutide -Weight Management, metoprolol tartrate, allopurinol, sulfamethoxazole-trimethoprim, and diclofenac.  Meds ordered this encounter  Medications  . DISCONTD: diclofenac (VOLTAREN) 75 MG EC tablet    Sig: Take 1 tablet (75 mg total) by mouth 2 (two) times daily.    Dispense:  60 tablet    Refill:  0  . diclofenac (VOLTAREN) 75 MG EC tablet    Sig: Take 1 tablet (75 mg total) by mouth 2 (two) times daily.    Dispense:  180 tablet    Refill:  1     Follow-up: No Follow-up on file.  Scarlette Calico, MD

## 2017-10-21 NOTE — Patient Instructions (Signed)
Prostatitis Prostatitis is swelling or inflammation of the prostate gland. The prostate is a walnut-sized gland that is involved in the production of semen. It is located below a man's bladder, in front of the rectum. There are four types of prostatitis:  Chronic nonbacterial prostatitis. This is the most common type of prostatitis. It may be associated with a viral infection or autoimmune disorder.  Acute bacterial prostatitis. This is the least common type of prostatitis. It starts quickly and is usually associated with a bladder infection, high fever, and shaking chills. It can occur at any age.  Chronic bacterial prostatitis. This type usually results from acute bacterial prostatitis that happens repeatedly (is recurrent) or has not been treated properly. It can occur in men of any age but is most common among middle-aged men whose prostate has begun to get larger. The symptoms are not as severe as symptoms caused by acute bacterial prostatitis.  Prostatodynia or chronic pelvic pain syndrome (CPPS). This type is also called pelvic floor disorder. It is associated with increased muscular tone in the pelvis surrounding the prostate. What are the causes? Bacterial prostatitis is caused by infection from bacteria. Chronic nonbacterial prostatitis may be caused by:  Urinary tract infections (UTIs).  Nerve damage.  A response by the body's disease-fighting system (autoimmune response).  Chemicals in the urine. The causes of the other types of prostatitis are usually not known. What are the signs or symptoms? Symptoms of this condition vary depending upon the type of prostatitis. If you have acute bacterial prostatitis, you may experience:  Urinary symptoms, such as:  Painful urination.  Burning during urination.  Frequent and sudden urges to urinate.  Inability to start urinating.  A weak or interrupted stream of urine.  Vomiting.  Nausea.  Fever.  Chills.  Inability to  empty the bladder completely.  Pain in the:  Muscles or joints.  Lower back.  Lower abdomen. If you have any of the other types of prostatitis, you may experience:  Urinary symptoms, such as:  Sudden urges to urinate.  Frequent urination.  Difficulty starting urination.  Weak urine stream.  Dribbling after urination.  Discharge from the urethra. The urethra is a tube that opens at the end of the penis.  Pain in the:  Testicles.  Penis or tip of the penis.  Rectum.  Area in front of the rectum and below the scrotum (perineum).  Problems with sexual function.  Painful ejaculation.  Bloody semen. How is this diagnosed? This condition may be diagnosed based on:  A physical and medical exam.  Your symptoms.  A urine test to check for bacteria.  An exam in which a health care provider uses a finger to feel the prostate (digital rectal exam).  A test of a sample of semen.  Blood tests.  Ultrasound.  Removal of prostate tissue to be examined under a microscope (biopsy).  Tests to check how your body handles urine (urodynamic tests).  A test to look inside your bladder or urethra (cystoscopy). How is this treated? Treatment for this condition depends on the type of prostatitis. Treatment may involve:  Medicines to relieve pain or inflammation.  Medicines to help relax your muscles.  Physical therapy.  Heat therapy.  Techniques to help you control certain body functions (biofeedback).  Relaxation exercises.  Antibiotic medicine, if your condition is caused by bacteria.  Warm water baths (sitz baths). Sitz baths help with relaxing your pelvic floor muscles, which helps to relieve pressure on the prostate. Follow   these instructions at home:  Take over-the-counter and prescription medicines only as told by your health care provider.  If you were prescribed an antibiotic, take it as told by your health care provider. Do not stop taking the  antibiotic even if you start to feel better.  If physical therapy, biofeedback, or relaxation exercises were prescribed, do exercises as instructed.  Take sitz baths as directed by your health care provider. For a sitz bath, sit in warm water that is deep enough to cover your hips and buttocks.  Keep all follow-up visits as told by your health care provider. This is important. Contact a health care provider if:  Your symptoms get worse.  You have a fever. Get help right away if:  You have chills.  You feel nauseous.  You vomit.  You feel light-headed or feel like you are going to faint.  You are unable to urinate.  You have blood or blood clots in your urine. This information is not intended to replace advice given to you by your health care provider. Make sure you discuss any questions you have with your health care provider. Document Released: 11/07/2000 Document Revised: 07/31/2016 Document Reviewed: 07/31/2016 [employer] Interactive Patient Education  2017 [employer]  

## 2017-10-24 LAB — METHYLMALONIC ACID, SERUM: METHYLMALONIC ACID, QUANT: 125 nmol/L (ref 87–318)

## 2017-12-18 ENCOUNTER — Telehealth: Payer: Self-pay

## 2017-12-18 NOTE — Telephone Encounter (Signed)
PA faxed to insurance

## 2018-01-06 ENCOUNTER — Other Ambulatory Visit (INDEPENDENT_AMBULATORY_CARE_PROVIDER_SITE_OTHER): Payer: BC Managed Care – PPO

## 2018-01-06 ENCOUNTER — Ambulatory Visit: Payer: BC Managed Care – PPO | Admitting: Internal Medicine

## 2018-01-06 ENCOUNTER — Encounter: Payer: Self-pay | Admitting: Internal Medicine

## 2018-01-06 VITALS — BP 156/100 | HR 77 | Temp 99.3°F | Resp 16 | Ht 71.0 in | Wt 313.0 lb

## 2018-01-06 DIAGNOSIS — I10 Essential (primary) hypertension: Secondary | ICD-10-CM | POA: Diagnosis not present

## 2018-01-06 DIAGNOSIS — R5383 Other fatigue: Secondary | ICD-10-CM

## 2018-01-06 DIAGNOSIS — G4719 Other hypersomnia: Secondary | ICD-10-CM | POA: Diagnosis not present

## 2018-01-06 DIAGNOSIS — J029 Acute pharyngitis, unspecified: Secondary | ICD-10-CM | POA: Diagnosis not present

## 2018-01-06 LAB — CBC WITH DIFFERENTIAL/PLATELET
BASOS ABS: 0.1 10*3/uL (ref 0.0–0.1)
Basophils Relative: 1.4 % (ref 0.0–3.0)
EOS PCT: 2.3 % (ref 0.0–5.0)
Eosinophils Absolute: 0.2 10*3/uL (ref 0.0–0.7)
HEMATOCRIT: 38.1 % — AB (ref 39.0–52.0)
HEMOGLOBIN: 13.2 g/dL (ref 13.0–17.0)
Lymphocytes Relative: 35.2 % (ref 12.0–46.0)
Lymphs Abs: 2.3 10*3/uL (ref 0.7–4.0)
MCHC: 34.5 g/dL (ref 30.0–36.0)
MCV: 89.3 fl (ref 78.0–100.0)
MONOS PCT: 11.3 % (ref 3.0–12.0)
Monocytes Absolute: 0.8 10*3/uL (ref 0.1–1.0)
Neutro Abs: 3.3 10*3/uL (ref 1.4–7.7)
Neutrophils Relative %: 49.8 % (ref 43.0–77.0)
Platelets: 269 10*3/uL (ref 150.0–400.0)
RBC: 4.27 Mil/uL (ref 4.22–5.81)
RDW: 13.8 % (ref 11.5–15.5)
WBC: 6.6 10*3/uL (ref 4.0–10.5)

## 2018-01-06 LAB — URINALYSIS, ROUTINE W REFLEX MICROSCOPIC
Bilirubin Urine: NEGATIVE
Hgb urine dipstick: NEGATIVE
LEUKOCYTES UA: NEGATIVE
NITRITE: NEGATIVE
PH: 7.5 (ref 5.0–8.0)
RBC / HPF: NONE SEEN (ref 0–?)
Specific Gravity, Urine: 1.02 (ref 1.000–1.030)
TOTAL PROTEIN, URINE-UPE24: NEGATIVE
Urine Glucose: NEGATIVE
Urobilinogen, UA: 0.2 (ref 0.0–1.0)

## 2018-01-06 LAB — THYROID PANEL WITH TSH
Free Thyroxine Index: 2.5 (ref 1.4–3.8)
T3 UPTAKE: 30 % (ref 22–35)
T4, Total: 8.2 ug/dL (ref 4.9–10.5)
TSH: 2.07 m[IU]/L (ref 0.40–4.50)

## 2018-01-06 LAB — COMPREHENSIVE METABOLIC PANEL
ALK PHOS: 38 U/L — AB (ref 39–117)
ALT: 47 U/L (ref 0–53)
AST: 29 U/L (ref 0–37)
Albumin: 4.3 g/dL (ref 3.5–5.2)
BUN: 14 mg/dL (ref 6–23)
CALCIUM: 9.3 mg/dL (ref 8.4–10.5)
CO2: 30 mEq/L (ref 19–32)
Chloride: 108 mEq/L (ref 96–112)
Creatinine, Ser: 1.04 mg/dL (ref 0.40–1.50)
GFR: 80.05 mL/min (ref >60.00)
GLUCOSE: 82 mg/dL (ref 70–99)
POTASSIUM: 3.8 meq/L (ref 3.5–5.1)
Sodium: 142 mEq/L (ref 135–145)
TOTAL PROTEIN: 6.8 g/dL (ref 6.0–8.3)
Total Bilirubin: 0.4 mg/dL (ref 0.2–1.2)

## 2018-01-06 LAB — POCT RAPID STREP A (OFFICE): RAPID STREP A SCREEN: NEGATIVE

## 2018-01-06 MED ORDER — AZILSARTAN-CHLORTHALIDONE 40-12.5 MG PO TABS: 1.0000 | ORAL_TABLET | Freq: Every day | ORAL | 0 refills | Status: DC

## 2018-01-06 NOTE — Patient Instructions (Signed)

## 2018-01-06 NOTE — Progress Notes (Signed)
Subjective:  Patient ID: Rickey Hurley, male    DOB: 03-07-1966  Age: 52 y.o. MRN: [id number]  CC: Sore Throat and Hypertension   HPI Rickey Hurley presents for f/up -he complains of a 5-day history of sore throat.  He denies cough, fever, chills, or lymphadenopathy.  He complains of a several month history of worsening fatigue and weight gain.  He is sleepy throughout the day and finds that he falls asleep in front of the TV after he comes home from work.  He denies DOE, CP, S OB, edema, or palpitations.  He thinks he snores but is not sure if there is some component of sleep apnea.  Outpatient Medications Prior to Visit  Medication Sig Dispense Refill  . allopurinol (ZYLOPRIM) 300 MG tablet Take 1 tablet (300 mg total) by mouth daily. 90 tablet 3  . aspirin 81 MG tablet Take 81 mg by mouth daily.      . Blood Glucose Monitoring Suppl (ONE TOUCH ULTRA 2) w/Device KIT Use to check blood sugars twice a day 1 each 0  . Colchicine (MITIGARE) 0.6 MG CAPS Take 1 tablet by mouth 2 (two) times daily. 60 capsule 5  . diclofenac (VOLTAREN) 75 MG EC tablet Take 1 tablet (75 mg total) by mouth 2 (two) times daily. 180 tablet 1  . fenofibrate 160 MG tablet Take 1 tablet (160 mg total) by mouth daily. 90 tablet 3  . fluticasone (FLONASE) 50 MCG/ACT nasal spray Place 2 sprays into both nostrils daily. 48 g 3  . glucose blood (COOL BLOOD GLUCOSE TEST STRIPS) test strip Use as instructed to test blood sugar twice daily. DX E11.9 200 each 3  . Insulin Pen Needle (NOVOFINE) 32G X 6 MM MISC 1 Act by Does not apply route daily. 100 each 3  . metoprolol tartrate (LOPRESSOR) 25 MG tablet Take 1 tablet (25 mg total) by mouth 2 (two) times daily. 180 tablet 3  . Omega-3 Fatty Acids (FISH OIL) 1000 MG CAPS Take by mouth daily.      [name] DELICA LANCETS 09N MISC Use to check blood sugars twice a day 300 each 3  . simvastatin (ZOCOR) 40 MG tablet Take 1 tablet (40 mg total) by mouth daily. 90 tablet 3  . Liraglutide  -Weight Management (SAXENDA) 18 MG/3ML SOPN Inject 3 mg into the skin daily. 15 pen 5   No facility-administered medications prior to visit.     ROS Review of Systems  Constitutional: Positive for fatigue and unexpected weight change. Negative for appetite change, chills, diaphoresis and fever.  HENT: Positive for sore throat. Negative for facial swelling, trouble swallowing and voice change.   Eyes: Negative for visual disturbance.  Respiratory: Negative for cough, chest tightness, shortness of breath and wheezing.   Cardiovascular: Negative for chest pain, palpitations and leg swelling.  Gastrointestinal: Negative for abdominal pain, diarrhea, nausea and vomiting.  Endocrine: Negative.  Negative for cold intolerance and heat intolerance.  Genitourinary: Negative.  Negative for difficulty urinating.  Musculoskeletal: Negative.   Skin: Negative.  Negative for color change and rash.  Allergic/Immunologic: Negative.   Neurological: Negative.  Negative for dizziness, weakness and light-headedness.  Hematological: Negative for adenopathy. Does not bruise/bleed easily.  Psychiatric/Behavioral: Negative.     Objective:  BP (!) 156/100 (BP Location: Left Arm, Patient Position: Sitting, Cuff Size: Large)   Pulse 77   Temp 99.3 F (37.4 C) (Oral)   Resp 16   Ht _0  (1.803 m)   Wt Marland Kitchen)  313 lb (142 kg)   SpO2 98%   BMI 43.65 kg/m   BP Readings from Last 3 Encounters:  01/06/18 (!) 156/100  10/21/17 120/80  10/01/17 116/80    Wt Readings from Last 3 Encounters:  01/06/18 (!) 313 lb (142 kg)  10/21/17 (!) 301 lb 8 oz (136.8 kg)  10/01/17 296 lb (134.3 kg)    Physical Exam  Constitutional: He is oriented to person, place, and time. No distress.  HENT:  Mouth/Throat: Mucous membranes are normal. Mucous membranes are not pale, not dry and not cyanotic. No oral lesions. No trismus in the jaw. No uvula swelling. Posterior oropharyngeal erythema present. No oropharyngeal exudate,  posterior oropharyngeal edema or tonsillar abscesses.  Eyes: Conjunctivae are normal. Left eye exhibits no discharge. No scleral icterus.  Neck: Normal range of motion. Neck supple. No JVD present. No thyromegaly present.  Cardiovascular: Normal rate, regular rhythm and normal heart sounds. Exam reveals no gallop.  No murmur heard. EKG--  Sinus  Rhythm  WITHIN NORMAL LIMITS- no change from the prior EKG   Pulmonary/Chest: Effort normal and breath sounds normal. No respiratory distress. He has no wheezes. He has no rales.  Abdominal: Soft. Bowel sounds are normal. He exhibits no distension and no mass. There is no tenderness.  Musculoskeletal: Normal range of motion. He exhibits edema (trace pitting edema BLE). He exhibits no tenderness or deformity.  Lymphadenopathy:    He has no cervical adenopathy.  Neurological: He is alert and oriented to person, place, and time.  Skin: Skin is warm and dry. No rash noted. He is not diaphoretic. No erythema. No pallor.  Psychiatric: He has a normal mood and affect. His behavior is normal. Judgment and thought content normal.  Vitals reviewed.   Lab Results  Component Value Date   WBC 6.6 01/06/2018   HGB 13.2 01/06/2018   HCT 38.1 (L) 01/06/2018   PLT 269.0 01/06/2018   GLUCOSE 82 01/06/2018   CHOL 139 05/07/2017   TRIG 210.0 (H) 05/07/2017   HDL 26.80 (L) 05/07/2017   LDLDIRECT 78.0 05/07/2017   LDLCALC 97 01/30/2014   ALT 47 01/06/2018   AST 29 01/06/2018   NA 142 01/06/2018   K 3.8 01/06/2018   CL 108 01/06/2018   CREATININE 1.04 01/06/2018   BUN 14 01/06/2018   CO2 30 01/06/2018   TSH 2.07 01/06/2018   PSA 0.45 10/21/2017   HGBA1C 5.7 05/07/2017    Dg Chest 2 View  Result Date: 09/30/2017 CLINICAL DATA:  Acute chest pain and shortness of breath today. EXAM: CHEST  2 VIEW COMPARISON:  05/14/2012 FINDINGS: This is a low volume film. Cardiomediastinal silhouette is unremarkable. Mild peribronchial thickening again noted. There is  no evidence of focal airspace disease, pulmonary edema, suspicious pulmonary nodule/mass, pleural effusion, or pneumothorax. No acute bony abnormalities are identified. IMPRESSION: No evidence of acute cardiopulmonary disease. Electronically Signed   By: Margarette Canada M.D.   On: 09/30/2017 20:47    Assessment & Plan:   Rickey Hurley was seen today for sore throat and hypertension.  Diagnoses and all orders for this visit:  Sore throat- His rapid strep screen is negative.  This is consistent with a viral pharyngitis.  He will use Cepacol lozenges and Tylenol as needed for the discomfort. -     POCT rapid strep A  Essential hypertension- His blood pressure is not adequately well controlled.  His lab work is negative for any secondary causes or endorgan damage.  His EKG is negative  for LVH or ischemia.  I have asked him to start treating this with an ARB/thiazide diuretic combination. -     CBC with Differential/Platelet; Future -     Comprehensive metabolic panel; Future -     Thyroid Panel With TSH; Future -     Urinalysis, Routine w reflex microscopic; Future -     Cortisol; Future -     EKG 12-Lead -     Discontinue: Azilsartan-Chlorthalidone (EDARBYCLOR) 40-12.5 MG TABS; Take 1 tablet by mouth daily. -     olmesartan-hydrochlorothiazide (BENICAR HCT) 40-12.5 MG tablet; Take 1 tablet by mouth daily.  Fatigue, unspecified type- His lab work is negative for any secondary or metabolic causes.  I asked him to be evaluated by sleep medicine. -     CBC with Differential/Platelet; Future -     Comprehensive metabolic panel; Future -     Thyroid Panel With TSH; Future -     Cortisol; Future  Excessive daytime sleepiness - I am concerned his fatigue, weight gain, and hypertension may be related to a sleep disorder. -     Ambulatory referral to Sleep Studies   I have discontinued Worthington Pouliot's Liraglutide -Weight Management and Azilsartan-Chlorthalidone. I am also having him start on  olmesartan-hydrochlorothiazide. Additionally, I am having him maintain his aspirin, Fish Oil, fluticasone, ONETOUCH DELICA LANCETS 78M, ONE TOUCH ULTRA 2, Insulin Pen Needle, glucose blood, Colchicine, simvastatin, fenofibrate, metoprolol tartrate, allopurinol, and diclofenac.  Meds ordered this encounter  Medications  . DISCONTD: Azilsartan-Chlorthalidone (EDARBYCLOR) 40-12.5 MG TABS    Sig: Take 1 tablet by mouth daily.    Dispense:  28 tablet    Refill:  0  . olmesartan-hydrochlorothiazide (BENICAR HCT) 40-12.5 MG tablet    Sig: Take 1 tablet by mouth daily.    Dispense:  90 tablet    Refill:  0     Follow-up: Return in about 4 weeks (around 02/03/2018).  Scarlette Calico, MD

## 2018-01-07 ENCOUNTER — Encounter: Payer: Self-pay | Admitting: Internal Medicine

## 2018-01-07 LAB — CORTISOL: CORTISOL PLASMA: 4.3 ug/dL

## 2018-01-07 MED ORDER — OLMESARTAN MEDOXOMIL-HCTZ 40-12.5 MG PO TABS: 1.0000 | ORAL_TABLET | Freq: Every day | ORAL | 0 refills | Status: DC

## 2018-02-08 ENCOUNTER — Encounter: Payer: Self-pay | Admitting: Neurology

## 2018-02-08 ENCOUNTER — Ambulatory Visit: Payer: BC Managed Care – PPO | Admitting: Neurology

## 2018-02-08 VITALS — BP 136/89 | HR 62 | Ht 71.0 in | Wt 304.0 lb

## 2018-02-08 DIAGNOSIS — R0683 Snoring: Secondary | ICD-10-CM | POA: Diagnosis not present

## 2018-02-08 DIAGNOSIS — Z6841 Body Mass Index (BMI) 40.0 and over, adult: Secondary | ICD-10-CM

## 2018-02-08 DIAGNOSIS — R400 Somnolence: Secondary | ICD-10-CM

## 2018-02-08 DIAGNOSIS — G4719 Other hypersomnia: Secondary | ICD-10-CM | POA: Diagnosis not present

## 2018-02-08 DIAGNOSIS — F458 Other somatoform disorders: Secondary | ICD-10-CM | POA: Diagnosis not present

## 2018-02-08 NOTE — Progress Notes (Signed)
Subjective:    Patient ID: Rickey Hurley is a 52 y.o. male.  HPI     [name], MD, PhD [facility] [address], [address] [place], [place] [address]  Dear Dr. [name],  I saw your patient, Rickey Hurley, upon your kind request in my neurologic clinic today for initial consultation of his sleep disorder, in particular, concern for underlying obstructive sleep apnea. The patient is unaccompanied today. As you know, Mr. [name] is a 52 year old right-handed gentleman with an underlying medical history of gout, hypertension, hyperlipidemia, allergic rhinitis, pre-diabetes and morbid obesity with BMI of over 40, who reports significant daytime somnolence. Symptoms have been ongoing for about 3+ months. He has also struggled with recurrent sinusitis in the past few months. He believes that he may snore a little bit, has woken himself up with some snoring and also rare occasions with gasping sensation. He has had rare acid reflux symptoms at night particularly when sleeping on his stomach. He has a history of bruxism and is wearing a bite guard. Family history is not known to him as he is adopted. He keeps a fairly thick schedule for his bedtime and rise time. Bedtime is sometimes as early as 9:30 but typically before 11 PM, does not take them long to fall asleep. He does not typically watch TV at that time. He does have a TV in his bedroom but turns it on typically in the mornings. Rise time is 6 AM. He denies telltale symptoms of restless leg syndrome but has had some leg jerking infrequently. Weight has been fluctuating. Recently he tried a weight loss medication but after initial success he plateaued. He does not typically have morning headaches or nocturia on a night to night basis. I reviewed your office note from 01/06/2018 as well as 10/21/2017. His Epworth sleepiness score is 18 out of 24 today, fatigue score is 48 out of 63. He lives alone, he is a professor at [employer]. He  has no children, is a nonsmoker, drinks alcohol occasionally, maybe once a month or so, caffeine daily in the form of coffee, up to 20 ounces per day. He has one dog who sleeps on his skin thighs bed with him typically.  His Past Medical History Is Significant For: Past Medical History:  Diagnosis Date  . Anal fistula 12/21/2009  . CLOSTRIDIUM DIFFICILE COLITIS 04/19/2010  . COLONIC POLYPS, HX OF 12/21/2009  . CONJUNCTIVITIS, ACUTE 04/11/2010  . GOUT 12/21/2009  . H/O viral pericarditis   . HYPERLIPIDEMIA 12/21/2009  . HYPERTENSION 12/21/2009  . [facility] 12/21/2009  . SINUSITIS- ACUTE-NOS 04/11/2010    His Past Surgical History Is Significant For: Past Surgical History:  Procedure Laterality Date  . anal lateral inferior sphincterotomy from c diff    . right ankle ORIF      His Family History Is Significant For: Family History  Adopted: Yes  Problem Relation Age of Onset  . Cancer Neg Hx   . Diabetes Neg Hx   . Early death Neg Hx   . Hearing loss Neg Hx   . Heart disease Neg Hx   . Hyperlipidemia Neg Hx   . Hypertension Neg Hx   . Stroke Neg Hx     His Social History Is Significant For: Social History   Socioeconomic History  . Marital status: Single    Spouse name: None  . Number of children: 0  . Years of education: None  . Highest education level: None  Social Needs  .  Financial resource strain: None  . Food insecurity - worry: None  . Food insecurity - inability: None  . Transportation needs - medical: None  . Transportation needs - non-medical: None  Occupational History  . Occupation: vice Press photographer: [employer]  Tobacco Use  . Smoking status: Never Smoker  . Smokeless tobacco: Never Used  Substance and Sexual Activity  . Alcohol use: Yes    Alcohol/week: 2.4 oz    Types: 4 Shots of liquor per week  . Drug use: No  . Sexual activity: Yes    Birth control/protection: None  Other Topics Concern  . None  Social History Narrative  . None    His  Allergies Are:  Allergies  Allergen Reactions  . Lisinopril Cough  . Moxifloxacin     REACTION: c diff with subsequent bowel resection  :   His Current Medications Are:  Outpatient Encounter Medications as of 02/08/2018  Medication Sig  . allopurinol (ZYLOPRIM) 300 MG tablet Take 1 tablet (300 mg total) by mouth daily.  Marland Kitchen aspirin 81 MG tablet Take 81 mg by mouth daily.    . Blood Glucose Monitoring Suppl (ONE TOUCH ULTRA 2) w/Device KIT Use to check blood sugars twice a day  . Colchicine (MITIGARE) 0.6 MG CAPS Take 1 tablet by mouth 2 (two) times daily.  . diclofenac (VOLTAREN) 75 MG EC tablet Take 1 tablet (75 mg total) by mouth 2 (two) times daily.  . fenofibrate 160 MG tablet Take 1 tablet (160 mg total) by mouth daily.  . fluticasone (FLONASE) 50 MCG/ACT nasal spray Place 2 sprays into both nostrils daily.  Marland Kitchen glucose blood (COOL BLOOD GLUCOSE TEST STRIPS) test strip Use as instructed to test blood sugar twice daily. DX E11.9  . Insulin Pen Needle (NOVOFINE) 32G X 6 MM MISC 1 Act by Does not apply route daily.  . metoprolol tartrate (LOPRESSOR) 25 MG tablet Take 1 tablet (25 mg total) by mouth 2 (two) times daily.  Marland Kitchen olmesartan-hydrochlorothiazide (BENICAR HCT) 40-12.5 MG tablet Take 1 tablet by mouth daily.  . Omega-3 Fatty Acids (FISH OIL) 1000 MG CAPS Take by mouth daily.    [name] DELICA LANCETS 35K MISC Use to check blood sugars twice a day  . simvastatin (ZOCOR) 40 MG tablet Take 1 tablet (40 mg total) by mouth daily.   No facility-administered encounter medications on file as of 02/08/2018.   :  Review of Systems:  Out of a complete 14 point review of systems, all are reviewed and negative with the exception of these symptoms as listed below: Review of Systems  Neurological:       Pt presents today to discuss his sleep. Pt has never had a sleep study, does not know if he snores, but endorses daytime sleepiness.  Epworth Sleepiness Scale 0= would never doze 1= slight  chance of dozing 2= moderate chance of dozing 3= high chance of dozing  Sitting and reading: 2 Watching TV: 3 Sitting inactive in a public place (ex. Theater or meeting): 2 As a passenger in a car for an hour without a break: 3 Lying down to rest in the afternoon: 3 Sitting and talking to someone: 1 Sitting quietly after lunch (no alcohol): 3 In a car, while stopped in traffic: 1 Total: 18    Objective:  Neurological Exam  Physical Exam Physical Examination:   Vitals:   02/08/18 0850  BP: 136/89  Pulse: 62    General Examination: The patient is a  very pleasant 52 y.o. male in no acute distress. He appears well-developed and well-nourished and well groomed.   HEENT: Normocephalic, atraumatic, pupils are equal, round and reactive to light and accommodation. Funduscopic exam is normal with sharp disc margins noted. Extraocular tracking is good without limitation to gaze excursion or nystagmus noted. Normal smooth pursuit is noted. Hearing is grossly intact. Tympanic membranes are clear bilaterally. Face is symmetric with normal facial animation and normal facial sensation. Speech is clear with no dysarthria noted. There is no hypophonia. There is no lip, neck/head, jaw or voice tremor. Neck is supple with full range of passive and active motion. There are no carotid bruits on auscultation. Oropharynx exam reveals: mild mouth dryness, adequate dental hygiene and moderate airway crowding, due to smaller airway entry, tonsils are 1+, elongated uvula which is on the thin side. Mallampati is class II. Neck circumference is 20-1/2 inches. He has a minimal to absent overbite. He is wearing his teeth liners.   Chest: Clear to auscultation without wheezing, rhonchi or crackles noted.  Heart: S1+S2+0, regular and normal without murmurs, rubs or gallops noted.   Abdomen: Soft, non-tender and non-distended with normal bowel sounds appreciated on auscultation.  Extremities: There is no pitting  edema in the distal lower extremities bilaterally. Pedal pulses are intact.  Skin: Warm and dry without trophic changes noted.  Musculoskeletal: exam reveals no obvious joint deformities, tenderness or joint swelling or erythema.   Neurologically:  Mental status: The patient is awake, alert and oriented in all 4 spheres. His immediate and remote memory, attention, language skills and fund of knowledge are appropriate. There is no evidence of aphasia, agnosia, apraxia or anomia. Speech is clear with normal prosody and enunciation. Thought process is linear. Mood is normal and affect is normal.  Cranial nerves II - XII are as described above under HEENT exam. In addition: shoulder shrug is normal with equal shoulder height noted. Motor exam: Normal bulk, strength and tone is noted. There is no drift, tremor or rebound. Romberg is negative. Reflexes are 1+ throughout. Fine motor skills and coordination: intact with normal finger taps, normal hand movements, normal rapid alternating patting, normal foot taps and normal foot agility.  Cerebellar testing: No dysmetria or intention tremor on finger to nose testing. Heel to shin is unremarkable bilaterally. There is no truncal or gait ataxia.  Sensory exam: intact to light touch in the upper and lower extremities.  Gait, station and balance: He stands easily. No veering to one side is noted. No leaning to one side is noted. Posture is age-appropriate and stance is narrow based. Gait shows normal stride length and normal pace. No problems turning are noted. Tandem walk is unremarkable. `  Assessment and Plan:   In summary, Lysle Yero is a very pleasant 52 y.o.-year old male with an underlying medical history of gout, hypertension, hyperlipidemia, allergic rhinitis, pre-diabetes and morbid obesity with BMI of over 40, whose history and physical exam are concerning for obstructive sleep apnea (OSA). I had a long chat with the patient about my findings and the  diagnosis of OSA, its prognosis and treatment options. We talked about medical treatments, surgical interventions and non-pharmacological approaches. I explained in particular the risks and ramifications of untreated moderate to severe OSA, especially with respect to developing cardiovascular disease down the Road, including congestive heart failure, difficult to treat hypertension, cardiac arrhythmias, or stroke. Even type 2 diabetes has, in part, been linked to untreated OSA. Symptoms of untreated OSA include daytime  sleepiness, memory problems, mood irritability and mood disorder such as depression and anxiety, lack of energy, as well as recurrent headaches, especially morning headaches. We talked about trying to maintain a healthy lifestyle in general, as well as the importance of weight control. I encouraged the patient to eat healthy, exercise daily and keep well hydrated, to keep a scheduled bedtime and wake time routine, to not skip any meals and eat healthy snacks in between meals. I advised the patient not to drive when feeling sleepy.  He is advised to consider medical weight loss through a weight loss program. He is encouraged to talk to you about a referral to Dr. Leafy Ro for example.  I recommended the following at this time: sleep study with potential positive airway pressure titration. (We will score hypopneas at 3%).   I explained the sleep test procedure to the patient and also outlined possible surgical and non-surgical treatment options of OSA, including the use of a custom-made dental device (which would require a referral to a specialist dentist or oral surgeon), upper airway surgical options, such as pillar implants, radiofrequency surgery, tongue base surgery, and UPPP (which would involve a referral to an ENT surgeon). Rarely, jaw surgery such as mandibular advancement may be considered.  I also explained the CPAP treatment option to the patient, who indicated that he would be willing  to try CPAP if the need arises. I explained the importance of being compliant with PAP treatment, not only for insurance purposes but primarily to improve His symptoms, and for the patient's long term health benefit, including to reduce His cardiovascular risks. I answered all his questions today and the patient was in agreement. I would like to see him back after the sleep study is completed and encouraged him to call with any interim questions, concerns, problems or updates.   Thank you very much for allowing me to participate in the care of this nice patient. If I can be of any further assistance to you please do not hesitate to call me at [phone].  Sincerely,   [name], MD, PhD

## 2018-02-08 NOTE — Patient Instructions (Addendum)

## 2018-02-17 ENCOUNTER — Ambulatory Visit: Payer: BC Managed Care – PPO | Admitting: Internal Medicine

## 2018-02-17 ENCOUNTER — Ambulatory Visit (INDEPENDENT_AMBULATORY_CARE_PROVIDER_SITE_OTHER)
Admission: RE | Admit: 2018-02-17 | Discharge: 2018-02-17 | Disposition: A | Discharge status: Home / Self Care | Payer: BC Managed Care – PPO | Source: Ambulatory Visit | Attending: Internal Medicine | Admitting: Internal Medicine

## 2018-02-17 ENCOUNTER — Encounter: Payer: Self-pay | Admitting: Internal Medicine

## 2018-02-17 VITALS — BP 124/80 | HR 55 | Temp 98.1°F | Ht 71.0 in | Wt 309.0 lb

## 2018-02-17 DIAGNOSIS — R059 Cough, unspecified: Secondary | ICD-10-CM

## 2018-02-17 DIAGNOSIS — R05 Cough: Secondary | ICD-10-CM

## 2018-02-17 DIAGNOSIS — H00024 Hordeolum internum left upper eyelid: Secondary | ICD-10-CM

## 2018-02-17 DIAGNOSIS — J988 Other specified respiratory disorders: Secondary | ICD-10-CM

## 2018-02-17 MED ORDER — CLINDAMYCIN HCL 300 MG PO CAPS: 300.0000 mg | ORAL_CAPSULE | Freq: Three times a day (TID) | ORAL | 0 refills | Status: AC

## 2018-02-17 MED ORDER — HYDROCODONE-HOMATROPINE 5-1.5 MG/5ML PO SYRP: 5.0000 mL | ORAL_SOLUTION | Freq: Three times a day (TID) | ORAL | 0 refills | Status: DC | PRN

## 2018-02-17 NOTE — Patient Instructions (Signed)

## 2018-02-17 NOTE — Progress Notes (Signed)
Subjective:  Patient ID: Rickey Hurley, male    DOB: 1966-08-23  Age: 52 y.o. MRN: [id number]  CC: Cough   HPI Rickey Hurley presents for a one-month history of cough that is gradually worsening.  The cough has been become productive of yellow/green/bloody phlegm.  He has had chills but no fevers.  He also complains of a 2-day history of left upper eyelid pain and swelling with crusting in his left eye.  Outpatient Medications Prior to Visit  Medication Sig Dispense Refill  . allopurinol (ZYLOPRIM) 300 MG tablet Take 1 tablet (300 mg total) by mouth daily. 90 tablet 3  . aspirin 81 MG tablet Take 81 mg by mouth daily.      . Blood Glucose Monitoring Suppl (ONE TOUCH ULTRA 2) w/Device KIT Use to check blood sugars twice a day 1 each 0  . Colchicine (MITIGARE) 0.6 MG CAPS Take 1 tablet by mouth 2 (two) times daily. 60 capsule 5  . diclofenac (VOLTAREN) 75 MG EC tablet Take 1 tablet (75 mg total) by mouth 2 (two) times daily. 180 tablet 1  . fenofibrate 160 MG tablet Take 1 tablet (160 mg total) by mouth daily. 90 tablet 3  . fluticasone (FLONASE) 50 MCG/ACT nasal spray Place 2 sprays into both nostrils daily. 48 g 3  . glucose blood (COOL BLOOD GLUCOSE TEST STRIPS) test strip Use as instructed to test blood sugar twice daily. DX E11.9 200 each 3  . Insulin Pen Needle (NOVOFINE) 32G X 6 MM MISC 1 Act by Does not apply route daily. 100 each 3  . metoprolol tartrate (LOPRESSOR) 25 MG tablet Take 1 tablet (25 mg total) by mouth 2 (two) times daily. 180 tablet 3  . olmesartan-hydrochlorothiazide (BENICAR HCT) 40-12.5 MG tablet Take 1 tablet by mouth daily. 90 tablet 0  . Omega-3 Fatty Acids (FISH OIL) 1000 MG CAPS Take by mouth daily.      [name] DELICA LANCETS 90W MISC Use to check blood sugars twice a day 300 each 3  . simvastatin (ZOCOR) 40 MG tablet Take 1 tablet (40 mg total) by mouth daily. 90 tablet 3   No facility-administered medications prior to visit.     ROS Review of Systems    Constitutional: Positive for chills. Negative for fatigue and fever.  HENT: Negative.  Negative for sinus pressure, sore throat and trouble swallowing.   Eyes: Positive for discharge. Negative for photophobia, redness, itching and visual disturbance.  Respiratory: Positive for cough. Negative for shortness of breath and wheezing.   Cardiovascular: Negative for chest pain, palpitations and leg swelling.  Gastrointestinal: Negative for abdominal pain, constipation, diarrhea and vomiting.  Endocrine: Negative.   Genitourinary: Negative.  Negative for difficulty urinating.  Musculoskeletal: Negative.   Skin: Negative.  Negative for color change and rash.  Allergic/Immunologic: Negative.   Neurological: Negative.  Negative for dizziness, weakness and light-headedness.  Hematological: Negative for adenopathy. Does not bruise/bleed easily.  Psychiatric/Behavioral: Negative.     Objective:  BP 124/80 (BP Location: Left Arm, Patient Position: Sitting, Cuff Size: Large)   Pulse (!) 55   Temp 98.1 F (36.7 C) (Oral)   Ht _0  (1.803 m)   Wt (!) 309 lb (140.2 kg)   SpO2 95%   BMI 43.10 kg/m   BP Readings from Last 3 Encounters:  02/17/18 124/80  02/08/18 136/89  01/06/18 (!) 156/100    Wt Readings from Last 3 Encounters:  02/17/18 (!) 309 lb (140.2 kg)  02/08/18 (!) 304 lb (  137.9 kg)  01/06/18 (!) 313 lb (142 kg)    Physical Exam  Constitutional: He is oriented to person, place, and time. No distress.  HENT:  Mouth/Throat: Oropharynx is clear and moist. No oropharyngeal exudate.  Eyes: Conjunctivae are normal. Right eye exhibits no chemosis, no discharge, no exudate and no hordeolum. No foreign body present in the right eye. Left eye exhibits hordeolum. Left eye exhibits no chemosis, no discharge and no exudate. No foreign body present in the left eye. No scleral icterus.  The surface of the left eye appears normal.  Over the left upper eyelid there is an internal hordeolum with a  small pustule extending to the epithelial surface.  Neck: Normal range of motion. Neck supple. No JVD present. No thyromegaly present.  Cardiovascular: Normal rate, regular rhythm and normal heart sounds. Exam reveals no gallop.  No murmur heard. Pulmonary/Chest: Effort normal and breath sounds normal. No respiratory distress. He has no wheezes. He has no rales.  Abdominal: Soft. Bowel sounds are normal. He exhibits no distension and no mass. There is no tenderness. There is no guarding.  Musculoskeletal: Normal range of motion. He exhibits no edema, tenderness or deformity.  Lymphadenopathy:    He has no cervical adenopathy.  Neurological: He is alert and oriented to person, place, and time.  Skin: Skin is warm and dry. No rash noted. He is not diaphoretic. No erythema. No pallor.  Vitals reviewed.   Lab Results  Component Value Date   WBC 6.6 01/06/2018   HGB 13.2 01/06/2018   HCT 38.1 (L) 01/06/2018   PLT 269.0 01/06/2018   GLUCOSE 82 01/06/2018   CHOL 139 05/07/2017   TRIG 210.0 (H) 05/07/2017   HDL 26.80 (L) 05/07/2017   LDLDIRECT 78.0 05/07/2017   LDLCALC 97 01/30/2014   ALT 47 01/06/2018   AST 29 01/06/2018   NA 142 01/06/2018   K 3.8 01/06/2018   CL 108 01/06/2018   CREATININE 1.04 01/06/2018   BUN 14 01/06/2018   CO2 30 01/06/2018   TSH 2.07 01/06/2018   PSA 0.45 10/21/2017   HGBA1C 5.7 05/07/2017    Dg Chest 2 View  Result Date: 09/30/2017 CLINICAL DATA:  Acute chest pain and shortness of breath today. EXAM: CHEST  2 VIEW COMPARISON:  05/14/2012 FINDINGS: This is a low volume film. Cardiomediastinal silhouette is unremarkable. Mild peribronchial thickening again noted. There is no evidence of focal airspace disease, pulmonary edema, suspicious pulmonary nodule/mass, pleural effusion, or pneumothorax. No acute bony abnormalities are identified. IMPRESSION: No evidence of acute cardiopulmonary disease. Electronically Signed   By: Margarette Canada M.D.   On: 09/30/2017  20:47   Dg Chest 2 View  Result Date: 02/17/2018 CLINICAL DATA:  Cough, congestion, shortness of breath and fever for 3 weeks, history hypertension EXAM: CHEST - 2 VIEW COMPARISON:  09/30/2017 FINDINGS: Normal heart size, mediastinal contours, and pulmonary vascularity. Mild chronic bronchitic changes. Lungs otherwise clear. No pleural effusion or pneumothorax. Bones unremarkable. IMPRESSION: Chronic bronchitic changes without infiltrate. Electronically Signed   By: Lavonia Dana M.D.   On: 02/17/2018 15:46    Assessment & Plan:   Rickey Hurley was seen today for cough.  Diagnoses and all orders for this visit:  Cough- His chest x-ray is negative for mass or infiltrate. -     DG Chest 2 View; Future -     Discontinue: HYDROcodone-homatropine (HYCODAN) 5-1.5 MG/5ML syrup; Take 5 mLs by mouth every 8 (eight) hours as needed for cough. -  HYDROcodone-homatropine (HYCODAN) 5-1.5 MG/5ML syrup; Take 5 mLs by mouth every 8 (eight) hours as needed for cough.  Hordeolum internum of left upper eyelid- I will treat the infection with an oral course of clindamycin. -     clindamycin (CLEOCIN) 300 MG capsule; Take 1 capsule (300 mg total) by mouth 3 (three) times daily for 7 days.  RTI (respiratory tract infection)- as above -     Discontinue: HYDROcodone-homatropine (HYCODAN) 5-1.5 MG/5ML syrup; Take 5 mLs by mouth every 8 (eight) hours as needed for cough. -     HYDROcodone-homatropine (HYCODAN) 5-1.5 MG/5ML syrup; Take 5 mLs by mouth every 8 (eight) hours as needed for cough.   I am having Rickey Hurley start on clindamycin. I am also having him maintain his aspirin, Fish Oil, fluticasone, ONETOUCH DELICA LANCETS 35G, ONE TOUCH ULTRA 2, Insulin Pen Needle, glucose blood, Colchicine, simvastatin, fenofibrate, metoprolol tartrate, allopurinol, diclofenac, olmesartan-hydrochlorothiazide, and HYDROcodone-homatropine.  Meds ordered this encounter  Medications  . clindamycin (CLEOCIN) 300 MG capsule    Sig: Take  1 capsule (300 mg total) by mouth 3 (three) times daily for 7 days.    Dispense:  21 capsule    Refill:  0  . DISCONTD: HYDROcodone-homatropine (HYCODAN) 5-1.5 MG/5ML syrup    Sig: Take 5 mLs by mouth every 8 (eight) hours as needed for cough.    Dispense:  120 mL    Refill:  0  . HYDROcodone-homatropine (HYCODAN) 5-1.5 MG/5ML syrup    Sig: Take 5 mLs by mouth every 8 (eight) hours as needed for cough.    Dispense:  120 mL    Refill:  0     Follow-up: No follow-ups on file.  Scarlette Calico, MD

## 2018-02-24 ENCOUNTER — Encounter: Payer: Self-pay | Admitting: Internal Medicine

## 2018-02-24 ENCOUNTER — Other Ambulatory Visit: Payer: Self-pay | Admitting: Internal Medicine

## 2018-02-24 DIAGNOSIS — T7840XA Allergy, unspecified, initial encounter: Secondary | ICD-10-CM

## 2018-02-24 MED ORDER — METHYLPREDNISOLONE 4 MG PO TBPK: ORAL_TABLET | ORAL | 0 refills | Status: DC

## 2018-02-26 ENCOUNTER — Ambulatory Visit: Payer: BC Managed Care – PPO | Admitting: Internal Medicine

## 2018-03-08 ENCOUNTER — Ambulatory Visit (INDEPENDENT_AMBULATORY_CARE_PROVIDER_SITE_OTHER): Payer: BC Managed Care – PPO | Admitting: Neurology

## 2018-03-08 DIAGNOSIS — G4719 Other hypersomnia: Secondary | ICD-10-CM

## 2018-03-08 DIAGNOSIS — F458 Other somatoform disorders: Secondary | ICD-10-CM

## 2018-03-08 DIAGNOSIS — G4733 Obstructive sleep apnea (adult) (pediatric): Secondary | ICD-10-CM

## 2018-03-08 DIAGNOSIS — G4720 Circadian rhythm sleep disorder, unspecified type: Secondary | ICD-10-CM

## 2018-03-08 DIAGNOSIS — Z6841 Body Mass Index (BMI) 40.0 and over, adult: Secondary | ICD-10-CM

## 2018-03-08 DIAGNOSIS — R0683 Snoring: Secondary | ICD-10-CM

## 2018-03-08 DIAGNOSIS — R400 Somnolence: Secondary | ICD-10-CM

## 2018-03-11 ENCOUNTER — Encounter: Payer: Self-pay | Admitting: Internal Medicine

## 2018-03-16 ENCOUNTER — Telehealth: Payer: Self-pay

## 2018-03-16 ENCOUNTER — Other Ambulatory Visit: Payer: Self-pay | Admitting: Internal Medicine

## 2018-03-16 DIAGNOSIS — H00024 Hordeolum internum left upper eyelid: Secondary | ICD-10-CM

## 2018-03-16 MED ORDER — DOXYCYCLINE HYCLATE 100 MG PO TABS: 100.0000 mg | ORAL_TABLET | Freq: Two times a day (BID) | ORAL | 0 refills | Status: DC

## 2018-03-16 NOTE — Telephone Encounter (Signed)
I called pt to discuss his sleep study results. No answer, left a message asking him to call me back. 

## 2018-03-16 NOTE — Addendum Note (Signed)
Addended by: Huston FoleyATHAR, Kingsly Kloepfer on: 03/16/2018 08:11 AM   Modules accepted: Orders

## 2018-03-16 NOTE — Procedures (Signed)
PATIENT'S NAME:  Rickey Hurley, Rickey Hurley DOB:      02/08/1966      MR#:    [id number]     DATE OF RECORDING: 03/08/2018 REFERRING M.D.:  Sanda Linger MD Study Performed:   Baseline Polysomnogram HISTORY: 52 year old man with a history of gout, hypertension, hyperlipidemia, allergic rhinitis, pre-diabetes and morbid obesity, who reports significant daytime somnolence. The patient endorsed the Epworth Sleepiness Scale at 18 points. The patient's weight 304 pounds with a height of 71 (inches), resulting in a BMI of 42.6 kg/m2. The patient's neck circumference measured 20.5 inches.  CURRENT MEDICATIONS: Zyloprim, Aspirin, Mitigare, Voltaren, Flonase, Novofine, Lopressor, Benicar, Zocor   PROCEDURE:  This is a multichannel digital polysomnogram utilizing the Somnostar 11.2 system.  Electrodes and sensors were applied and monitored per AASM Specifications.   EEG, EOG, Chin and Limb EMG, were sampled at 200 Hz.  ECG, Snore and Nasal Pressure, Thermal Airflow, Respiratory Effort, CPAP Flow and Pressure, Oximetry was sampled at 50 Hz. Digital video and audio were recorded.      BASELINE STUDY  Lights Out was at 22:51 and Lights On at 05:02.  Total recording time (TRT) was 371.5 minutes, with a total sleep time (TST) of  202 minutes.   The patient's sleep latency was 175 minutes, which is markedly delayed.  REM latency was 175 minutes, which is delayed significantly. The sleep efficiency was 54.4%, which is markedly reduced.     SLEEP ARCHITECTURE: WASO (Wake after sleep onset) was 122.5 minutes with significant difficulty initiating and maintaining sleep in the first half of the study. There were 29 minutes in Stage N1, 83 minutes Stage N2, 55 minutes Stage N3 and 35 minutes in Stage REM.  The percentage of Stage N1 was 14.4%, which is increased, Stage N2 was 41.1%, Stage N3 was 27.2%, which is increased, and Stage R (REM sleep) was 17.3%, which is mildly reduced. The arousals were noted as: 26 were spontaneous, 0 were  associated with PLMs, 26 were associated with respiratory events.  Audio and video analysis did not show any abnormal or unusual movements, behaviors, phonations or vocalizations. The patient took 1 bathroom break. Loud snoring was noted. The EKG was in keeping with normal sinus rhythm (NSR).  RESPIRATORY ANALYSIS:  There were a total of 149 respiratory events:  2 obstructive apneas, 5 central apneas and 0 mixed apneas with a total of 7 apneas and an apnea index (AI) of 2.1 /hour. There were 142 hypopneas with a hypopnea index of 42.2 /hour. The patient also had 0 respiratory event related arousals (RERAs).      The total APNEA/HYPOPNEA INDEX (AHI) was 44.3/hour and the total RESPIRATORY DISTURBANCE INDEX was 44.3 /hour.  38 events occurred in REM sleep and 213 events in NREM. The REM AHI was 65.1 /hour, versus a non-REM AHI of 39.9. The patient spent 7 minutes of total sleep time in the supine position and 195 minutes in non-supine.. The supine AHI was 8.6 versus a non-supine AHI of 45.6.  OXYGEN SATURATION & C02:  The Wake baseline 02 saturation was 97%, with the lowest being 75%. Time spent below 89% saturation equaled 61 minutes.   PERIODIC LIMB MOVEMENTS: The patient had a total of 0 Periodic Limb Movements.  The Periodic Limb Movement (PLM) index was 0 and the PLM Arousal index was 0/hour.  Post-study, the patient indicated that sleep was worse than usual.   IMPRESSION:  1. Obstructive Sleep Apnea (OSA) 2. Dysfunctions associated with sleep stages or arousal from  sleep  RECOMMENDATIONS:  1. This study demonstrates severe obstructive sleep apnea, with a total AHI of 44.3/hour, REM AHI of 65.1/hour, and O2 nadir of 75%. The near-absence of supine sleep may underestimate his sleep disordered breathing. Treatment with positive airway pressure in the form of CPAP is recommended. This will require a full night titration study to optimize therapy. Other treatment options may include avoidance of  supine sleep position along with weight loss, upper airway or jaw surgery in selected patients or the use of an oral appliance in certain patients. ENT evaluation and/or consultation with a maxillofacial surgeon or dentist may be feasible in some instances.    2. Please note that untreated obstructive sleep apnea carries additional perioperative morbidity. Patients with significant obstructive sleep apnea should receive perioperative PAP therapy and the surgeons and particularly the anesthesiologist should be informed of the diagnosis and the severity of the sleep disordered breathing. 3. This study shows sleep fragmentation and abnormal sleep stage percentages; these are nonspecific findings and per se do not signify an intrinsic sleep disorder or a cause for the patient's sleep-related symptoms. Causes include (but are not limited to) the first night effect of the sleep study, circadian rhythm disturbances, medication effect or an underlying mood disorder or medical problem.  4. The patient should be cautioned not to drive, work at heights, or operate dangerous or heavy equipment when tired or sleepy. Review and reiteration of good sleep hygiene measures should be pursued with any patient. 5. The patient will be seen in [facility] for discussion of the test results and further management strategies. The referring provider will be notified of the test results.  I certify that I have reviewed the entire raw data recording prior to the issuance of this report in accordance with the Standards of Accreditation of the American Academy of Sleep Medicine (AASM)   Huston FoleySaima Candia Kingsbury, MD, PhD Diplomat, American Board of Psychiatry and Neurology (Neurology and Sleep Medicine)

## 2018-03-16 NOTE — Telephone Encounter (Signed)
-----   Message from Huston FoleySaima Athar, MD sent at 03/16/2018  8:11 AM EDT ----- Patient referred by Dr. Yetta BarreJones, seen by me on 02/08/18, diagnostic PSG on 03/08/18.   Please call and notify the patient that the recent sleep study showed severe obstructive sleep apnea, with a total AHI of 44.3/hour, REM AHI of 65.1/hour, and O2 nadir of 75%. I recommend treatment for this in the form of CPAP. This will require a repeat sleep study for proper titration and mask fitting and correct monitoring of the oxygen saturations. Please explain to patient. I have placed an order in the chart. Thanks.  Huston FoleySaima Athar, MD, PhD [facility])

## 2018-03-16 NOTE — Progress Notes (Signed)
Patient referred by Dr. Yetta BarreJones, seen by me on 02/08/18, diagnostic PSG on 03/08/18.   Please call and notify the patient that the recent sleep study showed severe obstructive sleep apnea, with a total AHI of 44.3/hour, REM AHI of 65.1/hour, and O2 nadir of 75%. I recommend treatment for this in the form of CPAP. This will require a repeat sleep study for proper titration and mask fitting and correct monitoring of the oxygen saturations. Please explain to patient. I have placed an order in the chart. Thanks.  Huston FoleySaima Jermain Curt, MD, PhD [facility])

## 2018-03-17 NOTE — Telephone Encounter (Signed)
I called pt. I advised pt that Dr. Frances FurbishAthar reviewed their sleep study results and found that pt has severe osa and recommends that pt be treated with a cpap. Dr. Frances FurbishAthar recommends that pt return for a repeat sleep study in order to properly titrate the cpap and ensure a good mask fit. Pt is agreeable to returning for a titration study. I advised pt that our sleep lab will file with pt's insurance and call pt to schedule the sleep study when we hear back from the pt's insurance regarding coverage of this sleep study. Pt verbalized understanding of results. Pt had no questions at this time but was encouraged to call back if questions arise.  Pt is asking to schedule his titration study as soon as possible. Will inform our sleep lab. I did inform pt that we wait until an insurance determination on the sleep study has been received before calling to schedule the titration study. Pt verbalized understanding.

## 2018-03-31 ENCOUNTER — Telehealth: Payer: Self-pay

## 2018-03-31 NOTE — Telephone Encounter (Signed)
Key: [id number]   Submitted today via Cover My Meds for [employer] for prior auth.  If [employer] has not responded to your request within 24 hours, contact [employer] at [phone]. If you think there may be a problem with your PA request, use our live chat feature at the bottom right.

## 2018-04-06 NOTE — Telephone Encounter (Signed)
PA approved and pt informed of same.  

## 2018-04-08 ENCOUNTER — Ambulatory Visit (INDEPENDENT_AMBULATORY_CARE_PROVIDER_SITE_OTHER): Payer: BC Managed Care – PPO | Admitting: Neurology

## 2018-04-08 DIAGNOSIS — F458 Other somatoform disorders: Secondary | ICD-10-CM

## 2018-04-08 DIAGNOSIS — R400 Somnolence: Secondary | ICD-10-CM

## 2018-04-08 DIAGNOSIS — G4733 Obstructive sleep apnea (adult) (pediatric): Secondary | ICD-10-CM | POA: Diagnosis not present

## 2018-04-08 DIAGNOSIS — G4720 Circadian rhythm sleep disorder, unspecified type: Secondary | ICD-10-CM

## 2018-04-08 DIAGNOSIS — Z6841 Body Mass Index (BMI) 40.0 and over, adult: Secondary | ICD-10-CM

## 2018-04-08 DIAGNOSIS — G4719 Other hypersomnia: Secondary | ICD-10-CM

## 2018-04-12 ENCOUNTER — Telehealth: Payer: Self-pay

## 2018-04-12 NOTE — Addendum Note (Signed)
Addended by: Huston Foley on: 04/12/2018 08:36 AM   Modules accepted: Orders

## 2018-04-12 NOTE — Progress Notes (Signed)
Patient referred by Dr. Yetta Barre, seen by me on 02/08/18, diagnostic PSG on 03/08/18.  Patient had a CPAP titration study on 04/08/18.  Please call and inform patient that I have entered an order for treatment with positive airway pressure (PAP) treatment for obstructive sleep apnea (OSA). He did well during the latest sleep study with CPAP. We will, therefore, arrange for a machine for home use through a DME (durable medical equipment) company of His choice; and I will see the patient back in follow-up in about 10 weeks. Please also explain to the patient that I will be looking out for compliance data, which can be downloaded from the machine (stored on an SD card, that is inserted in the machine) or via remote access through a modem, that is built into the machine. At the time of the followup appointment we will discuss sleep study results and how it is going with PAP treatment at home. Please advise patient to bring His machine at the time of the first FU visit, even though this is cumbersome. Bringing the machine for every visit after that will likely not be needed, but often helps for the first visit to troubleshoot if needed. Please re-enforce the importance of compliance with treatment and the need for [place] to monitor compliance data - often an insurance requirement and actually good feedback for the patient as far as how they are doing.  Also remind patient, that any interim PAP machine or mask issues should be first addressed with the DME company, as they can often help better with technical and mask fit issues. Please ask if patient has a preference regarding DME company.  Please also make sure, the patient has a follow-up appointment with me in about 10 weeks from the setup date, thanks. May see one of our nurse practitioners if needed for proper timing of the FU appointment.  Please fax or rout report to the referring provider. Thanks,   Huston Foley, MD, PhD [facility])

## 2018-04-12 NOTE — Telephone Encounter (Signed)
I called pt. I advised pt that Dr. Frances Furbish reviewed their sleep study results and found that pt did well with the cpap during his latest sleep study. Dr. Frances Furbish recommends that pt start a cpap at home. I reviewed PAP compliance expectations with the pt. Pt is agreeable to starting a CPAP. I advised pt that an order will be sent to a DME, [employer], and [employer] will call the pt within about one week after they file with the pt's insurance. [employer] will show the pt how to use the machine, fit for masks, and troubleshoot the CPAP if needed. A follow up appt was made for insurance purposes with Dr. Frances Furbish on 06/30/18 at 1:00pm. Pt verbalized understanding to arrive 15 minutes early and bring their CPAP. A letter with all of this information in it will be sent to the pt's mychart as a reminder. I verified with the pt that the address we have on file is correct. Pt verbalized understanding of results. Pt had no questions at this time but was encouraged to call back if questions arise.

## 2018-04-12 NOTE — Procedures (Signed)
PATIENT'S NAME:  Rickey Hurley, Rickey Hurley DOB:      February 14, 1966      MR#:    [id number]     DATE OF RECORDING: 04/08/2018 REFERRING M.D.:  Sanda Linger MD Study Performed:   CPAP  Titration HISTORY: 52 year old man with a history of gout, hypertension, hyperlipidemia, allergic rhinitis, pre-diabetes and morbid obesity, who returns for CPAP titration. His baseline PSG on 03/08/18 showed severe obstructive sleep apnea, with a total AHI of 44.3/hour, REM AHI of 65.1/hour, and O2 nadir of 75%. The patient endorsed the Epworth Sleepiness Scale at 18/24 points. The patient's weight 304 pounds with a height of 71 (inches), resulting in a BMI of 42.6 kg/m2. The patient's neck circumference measured 20 inches.  CURRENT MEDICATIONS: Zyloprim, Aspirin, Mitigare, Voltaren, Flonase, Novofine, Lopressor, Benicar, Zocor  PROCEDURE:  This is a multichannel digital polysomnogram utilizing the SomnoStar 11.2 system.  Electrodes and sensors were applied and monitored per AASM Specifications.   EEG, EOG, Chin and Limb EMG, were sampled at 200 Hz.  ECG, Snore and Nasal Pressure, Thermal Airflow, Respiratory Effort, CPAP Flow and Pressure, Oximetry was sampled at 50 Hz. Digital video and audio were recorded.      The patient was fitted with a small Dreamwear FFM. CPAP was initiated at 5 cmH20 with heated humidity per AASM split night standards and pressure was advanced to 12 cmH20 because of hypopneas, apneas and desaturations.  At a PAP pressure of 13 cmH20, there was a reduction of the AHI to 0/hour, with supine REM sleep achieved and O2 nadir of 95%.   Lights Out was at 22:13 and Lights On at 04:57. Total recording time (TRT) was 404 minutes, with a total sleep time (TST) of 346.5 minutes. The patient's sleep latency was 19.5 minutes. REM latency was 67.5 minutes.  The sleep efficiency was 85.8 %.    SLEEP ARCHITECTURE: WASO (Wake after sleep onset) was 37 minutes with mild sleep fragmentation noted.  There were 18.5 minutes in Stage  N1, 182.5 minutes Stage N2, 78 minutes Stage N3 and 67.5 minutes in Stage REM.  The percentage of Stage N1 was 5.3%, Stage N2 was 52.7%, which is normal, Stage N3 was 22.5%, which is mildly increased, and Stage R (REM sleep) was 19.5%, which is near-normal. The arousals were noted as: 22 were spontaneous, 0 were associated with PLMs, 8 were associated with respiratory events.  RESPIRATORY ANALYSIS:  There was a total of 58 respiratory events: 0 obstructive apneas, 21 central apneas and 0 mixed apneas with a total of 21 apneas and an apnea index (AI) of 3.6 /hour. There were 37 hypopneas with a hypopnea index of 6.4/hour. The patient also had 0 respiratory event related arousals (RERAs).      The total APNEA/HYPOPNEA INDEX  (AHI) was 10.0. /hour and the total RESPIRATORY DISTURBANCE INDEX was 10. 0./hour  17 events occurred in REM sleep and 41 events in NREM. The REM AHI was 15.1 /hour versus a non-REM AHI of 8.8 0./hour.  The patient spent 244.5 minutes of total sleep time in the supine position and 102 minutes in non-supine. The supine AHI was 12.3, versus a non-supine AHI of 4.7.  OXYGEN SATURATION & C02:  The baseline 02 saturation was 95%, with the lowest being 83%. Time spent below 89% saturation equaled 25 minutes.  PERIODIC LIMB MOVEMENTS:  The patient had a total of 0 Periodic Limb Movements. The Periodic Limb Movement (PLM) index was 0 and the PLM Arousal index was 0 /hour.  Audio  and video analysis did not show any abnormal or unusual movements, behaviors, phonations or vocalizations. The patient took no bathroom breaks. The EKG was in keeping with normal sinus rhythm (NSR).  Post-study, the patient indicated that sleep was better than usual.   IMPRESSION:   1. Obstructive Sleep Apnea (OSA) 2. Dysfunctions associated with sleep stages or arousal from sleep   RECOMMENDATIONS:   1. This study demonstrates resolution of the patient's obstructive sleep apnea with CPAP therapy. I will,  therefore, start the patient on home CPAP treatment at a pressure of 13 cm via small FFM with heated humidity. The patient should be reminded to be fully compliant with PAP therapy to improve sleep related symptoms and decrease long term cardiovascular risks. The patient should be reminded, that it may take up to 3 months to get fully used to using PAP with all planned sleep. The earlier full compliance is achieved, the better long term compliance tends to be. Please note that untreated obstructive sleep apnea of this degree carries additional perioperative morbidity. Patients with significant obstructive sleep apnea should receive perioperative PAP therapy and the surgeons and particularly the anesthesiologist should be informed of the diagnosis and the severity of the sleep disordered breathing. 2. This study shows mild sleep fragmentation and abnormal sleep stage percentages; these are nonspecific findings and per se do not signify an intrinsic sleep disorder or a cause for the patient's sleep-related symptoms. Causes include (but are not limited to) the first night effect of the sleep study, circadian rhythm disturbances, medication effect or an underlying mood disorder or medical problem.  3. The patient should be cautioned not to drive, work at heights, or operate dangerous or heavy equipment when tired or sleepy. Review and reiteration of good sleep hygiene measures should be pursued with any patient. 4. The patient will be seen in follow-up by Dr. Frances Furbish at [facility] for discussion of the test results and further management strategies. The referring provider will be notified of the test results.   I certify that I have reviewed the entire raw data recording prior to the issuance of this report in accordance with the Standards of Accreditation of the American Academy of Sleep Medicine (AASM)     Huston Foley, MD, PhD Diplomat, American Board of Psychiatry and Neurology (Neurology and Sleep Medicine)

## 2018-04-12 NOTE — Telephone Encounter (Signed)
-----   Message from Huston Foley, MD sent at 04/12/2018  8:35 AM EDT ----- Patient referred by Dr. Yetta Barre, seen by me on 02/08/18, diagnostic PSG on 03/08/18.  Patient had a CPAP titration study on 04/08/18.  Please call and inform patient that I have entered an order for treatment with positive airway pressure (PAP) treatment for obstructive sleep apnea (OSA). He did well during the latest sleep study with CPAP. We will, therefore, arrange for a machine for home use through a DME (durable medical equipment) company of His choice; and I will see the patient back in follow-up in about 10 weeks. Please also explain to the patient that I will be looking out for compliance data, which can be downloaded from the machine (stored on an SD card, that is inserted in the machine) or via remote access through a modem, that is built into the machine. At the time of the followup appointment we will discuss sleep study results and how it is going with PAP treatment at home. Please advise patient to bring His machine at the time of the first FU visit, even though this is cumbersome. Bringing the machine for every visit after that will likely not be needed, but often helps for the first visit to troubleshoot if needed. Please re-enforce the importance of compliance with treatment and the need for [place] to monitor compliance data - often an insurance requirement and actually good feedback for the patient as far as how they are doing.  Also remind patient, that any interim PAP machine or mask issues should be first addressed with the DME company, as they can often help better with technical and mask fit issues. Please ask if patient has a preference regarding DME company.  Please also make sure, the patient has a follow-up appointment with me in about 10 weeks from the setup date, thanks. May see one of our nurse practitioners if needed for proper timing of the FU appointment.  Please fax or rout report to the referring provider.  Thanks,   Huston Foley, MD, PhD [facility])

## 2018-05-19 ENCOUNTER — Encounter: Payer: BC Managed Care – PPO | Admitting: Internal Medicine

## 2018-05-20 ENCOUNTER — Encounter: Payer: Self-pay | Admitting: Internal Medicine

## 2018-05-20 ENCOUNTER — Other Ambulatory Visit (INDEPENDENT_AMBULATORY_CARE_PROVIDER_SITE_OTHER): Payer: BC Managed Care – PPO

## 2018-05-20 ENCOUNTER — Telehealth: Payer: Self-pay

## 2018-05-20 ENCOUNTER — Ambulatory Visit (INDEPENDENT_AMBULATORY_CARE_PROVIDER_SITE_OTHER): Payer: BC Managed Care – PPO | Admitting: Internal Medicine

## 2018-05-20 VITALS — BP 142/90 | HR 76 | Temp 99.1°F | Resp 16 | Ht 71.0 in | Wt 280.0 lb

## 2018-05-20 DIAGNOSIS — Z0000 Encounter for general adult medical examination without abnormal findings: Secondary | ICD-10-CM | POA: Diagnosis not present

## 2018-05-20 DIAGNOSIS — E781 Pure hyperglyceridemia: Secondary | ICD-10-CM | POA: Diagnosis not present

## 2018-05-20 DIAGNOSIS — I10 Essential (primary) hypertension: Secondary | ICD-10-CM

## 2018-05-20 LAB — LIPID PANEL
CHOL/HDL RATIO: 5
Cholesterol: 135 mg/dL (ref 0–200)
HDL: 28 mg/dL — ABNORMAL LOW (ref >39.00)
NONHDL: 106.77
TRIGLYCERIDES: 354 mg/dL — AB (ref 0.0–149.0)
VLDL: 70.8 mg/dL — ABNORMAL HIGH (ref 0.0–40.0)

## 2018-05-20 LAB — BASIC METABOLIC PANEL
BUN: 18 mg/dL (ref 6–23)
CO2: 24 mEq/L (ref 19–32)
CREATININE: 0.88 mg/dL (ref 0.40–1.50)
Calcium: 9.5 mg/dL (ref 8.4–10.5)
Chloride: 106 mEq/L (ref 96–112)
GFR: 96.93 mL/min (ref >60.00)
GLUCOSE: 93 mg/dL (ref 70–99)
Potassium: 4 mEq/L (ref 3.5–5.1)
Sodium: 138 mEq/L (ref 135–145)

## 2018-05-20 LAB — LDL CHOLESTEROL, DIRECT: LDL DIRECT: 69 mg/dL

## 2018-05-20 MED ORDER — ICOSAPENT ETHYL 1 G PO CAPS: 2.0000 | ORAL_CAPSULE | Freq: Two times a day (BID) | ORAL | 1 refills | Status: DC

## 2018-05-20 MED ORDER — AZILSARTAN MEDOXOMIL 80 MG PO TABS: 1.0000 | ORAL_TABLET | Freq: Every day | ORAL | 1 refills | Status: DC

## 2018-05-20 NOTE — Progress Notes (Signed)
 Subjective:  Patient ID: Rickey Hurley, male    DOB: 11/03/1966  Age: 52 y.o. MRN: [id number]  CC: Hypertension; Hyperlipidemia; and Annual Exam   HPI Rickey Hurley presents for a CPX.  He feels much better since I last saw him.  He has lost over 20 pounds and he is treating the sleep apnea.  He is not taking anything for blood pressure control.  He denies any recent episodes of headache, blurred vision, chest pain, shortness of breath, palpitations, edema, or fatigue.  Outpatient Medications Prior to Visit  Medication Sig Dispense Refill  . allopurinol (ZYLOPRIM) 300 MG tablet Take 1 tablet (300 mg total) by mouth daily. 90 tablet 3  . Blood Glucose Monitoring Suppl (ONE TOUCH ULTRA 2) w/Device KIT Use to check blood sugars twice a day 1 each 0  . fenofibrate 160 MG tablet Take 1 tablet (160 mg total) by mouth daily. 90 tablet 3  . glucose blood (COOL BLOOD GLUCOSE TEST STRIPS) test strip Use as instructed to test blood sugar twice daily. DX E11.9 200 each 3  . Insulin Pen Needle (NOVOFINE) 32G X 6 MM MISC 1 Act by Does not apply route daily. 100 each 3  . metoprolol tartrate (LOPRESSOR) 25 MG tablet Take 1 tablet (25 mg total) by mouth 2 (two) times daily. 180 tablet 3  . ONETOUCH DELICA LANCETS 33G MISC Use to check blood sugars twice a day 300 each 3  . SAXENDA 18 MG/3ML SOPN INJECT 3 MG INTO THE SKIN DAILY  5  . simvastatin (ZOCOR) 40 MG tablet Take 1 tablet (40 mg total) by mouth daily. 90 tablet 3  . olmesartan-hydrochlorothiazide (BENICAR HCT) 40-12.5 MG tablet Take 1 tablet by mouth daily. 90 tablet 0  . Omega-3 Fatty Acids (FISH OIL) 1000 MG CAPS Take by mouth daily.      . Colchicine (MITIGARE) 0.6 MG CAPS Take 1 tablet by mouth 2 (two) times daily. 60 capsule 5  . diclofenac (VOLTAREN) 75 MG EC tablet Take 1 tablet (75 mg total) by mouth 2 (two) times daily. 180 tablet 1  . fluticasone (FLONASE) 50 MCG/ACT nasal spray Place 2 sprays into both nostrils daily. 48 g 3   No  facility-administered medications prior to visit.     ROS Review of Systems  Constitutional: Negative.  Negative for diaphoresis, fatigue and unexpected weight change.  HENT: Negative.   Eyes: Negative for visual disturbance.  Respiratory: Positive for apnea. Negative for cough, chest tightness, shortness of breath and wheezing.   Cardiovascular: Negative for chest pain, palpitations and leg swelling.  Gastrointestinal: Negative.  Negative for abdominal pain, constipation, diarrhea, nausea and vomiting.  Endocrine: Negative for cold intolerance and heat intolerance.  Genitourinary: Negative.  Negative for difficulty urinating, discharge, penile pain, penile swelling, scrotal swelling, testicular pain and urgency.  Musculoskeletal: Negative.  Negative for arthralgias and myalgias.  Skin: Negative for color change, pallor and rash.  Allergic/Immunologic: Negative.   Neurological: Negative.  Negative for dizziness, weakness, light-headedness and numbness.  Hematological: Negative for adenopathy. Does not bruise/bleed easily.  Psychiatric/Behavioral: Negative.     Objective:  BP (!) 142/90 (BP Location: Left Arm, Patient Position: Sitting, Cuff Size: Large)   Pulse 76   Temp 99.1 F (37.3 C) (Oral)   Resp 16   Ht 5' 11" (1.803 m)   Wt 280 lb (127 kg)   SpO2 97%   BMI 39.05 kg/m   BP Readings from Last 3 Encounters:  05/20/18 (!) 142/90  02/17/18 124/80    02/08/18 136/89    Wt Readings from Last 3 Encounters:  05/20/18 280 lb (127 kg)  02/17/18 (!) 309 lb (140.2 kg)  02/08/18 (!) 304 lb (137.9 kg)    Physical Exam  Constitutional: He is oriented to person, place, and time. No distress.  HENT:  Mouth/Throat: Oropharynx is clear and moist. No oropharyngeal exudate.  Eyes: Conjunctivae are normal. No scleral icterus.  Neck: Normal range of motion. Neck supple. No JVD present. No thyromegaly present.  Cardiovascular: Normal rate, regular rhythm and normal heart sounds. Exam  reveals no friction rub.  No murmur heard. Pulmonary/Chest: Effort normal and breath sounds normal. No respiratory distress. He has no wheezes. He has no rales.  Abdominal: Soft. Bowel sounds are normal. He exhibits no mass. There is no hepatosplenomegaly. There is no tenderness. No hernia. Hernia confirmed negative in the right inguinal area and confirmed negative in the left inguinal area.  Genitourinary: Rectum normal, prostate normal, testes normal and penis normal. Rectal exam shows no external hemorrhoid, no internal hemorrhoid, no fissure, no mass, no tenderness, anal tone normal and guaiac negative stool. Prostate is not enlarged and not tender. Right testis shows no mass, no swelling and no tenderness. Right testis is descended. Left testis shows no mass, no swelling and no tenderness. Left testis is descended. Circumcised. No penile erythema or penile tenderness. No discharge found.  Musculoskeletal: Normal range of motion. He exhibits no edema, tenderness or deformity.  Lymphadenopathy:    He has no cervical adenopathy. No inguinal adenopathy noted on the right or left side.  Neurological: He is alert and oriented to person, place, and time.  Skin: Skin is warm and dry. No rash noted. He is not diaphoretic.  Vitals reviewed.   Lab Results  Component Value Date   WBC 6.6 01/06/2018   HGB 13.2 01/06/2018   HCT 38.1 (L) 01/06/2018   PLT 269.0 01/06/2018   GLUCOSE 93 05/20/2018   CHOL 135 05/20/2018   TRIG 354.0 (H) 05/20/2018   HDL 28.00 (L) 05/20/2018   LDLDIRECT 69.0 05/20/2018   LDLCALC 97 01/30/2014   ALT 47 01/06/2018   AST 29 01/06/2018   NA 138 05/20/2018   K 4.0 05/20/2018   CL 106 05/20/2018   CREATININE 0.88 05/20/2018   BUN 18 05/20/2018   CO2 24 05/20/2018   TSH 2.07 01/06/2018   PSA 0.45 10/21/2017   HGBA1C 5.7 05/07/2017    Dg Chest 2 View  Result Date: 02/17/2018 CLINICAL DATA:  Cough, congestion, shortness of breath and fever for 3 weeks, history  hypertension EXAM: CHEST - 2 VIEW COMPARISON:  09/30/2017 FINDINGS: Normal heart size, mediastinal contours, and pulmonary vascularity. Mild chronic bronchitic changes. Lungs otherwise clear. No pleural effusion or pneumothorax. Bones unremarkable. IMPRESSION: Chronic bronchitic changes without infiltrate. Electronically Signed   By: Mark  Boles M.D.   On: 02/17/2018 15:46    Assessment & Plan:   Griffith was seen today for hypertension, hyperlipidemia and annual exam.  Diagnoses and all orders for this visit:  Essential hypertension- His blood pressure is not adequately well controlled.  His electrolytes and renal function are normal.  I have asked him to start taking an ARB for this. -     Basic metabolic panel; Future -     Azilsartan Medoxomil (EDARBI) 80 MG TABS; Take 1 tablet (80 mg total) by mouth daily.  Preventative health care- Exam completed, labs reviewed, vaccines reviewed, screening for colon cancer is up-to-date, patient education material was given. -       Lipid panel; Future  Pure hyperglyceridemia- His fasting triglycerides are up to 354.  I have asked him to start taking an omega-3 fish oil to reduce his CV risk as well as risk of pancreatitis due to hypertriglyceridemia. -     Icosapent Ethyl (VASCEPA) 1 g CAPS; Take 2 capsules (2 g total) by mouth 2 (two) times daily.   I have discontinued Rickey Hurley's Fish Oil, fluticasone, Colchicine, diclofenac, and olmesartan-hydrochlorothiazide. I am also having him start on Azilsartan Medoxomil and Icosapent Ethyl. Additionally, I am having him maintain his ONETOUCH DELICA LANCETS 63F, ONE TOUCH ULTRA 2, Insulin Pen Needle, glucose blood, simvastatin, fenofibrate, metoprolol tartrate, allopurinol, and SAXENDA.  Meds ordered this encounter  Medications  . Azilsartan Medoxomil (EDARBI) 80 MG TABS    Sig: Take 1 tablet (80 mg total) by mouth daily.    Dispense:  90 tablet    Refill:  1  . Icosapent Ethyl (VASCEPA) 1 g CAPS    Sig: Take  2 capsules (2 g total) by mouth 2 (two) times daily.    Dispense:  360 capsule    Refill:  1     Follow-up: Return in about 6 months (around 11/19/2018).  Scarlette Calico, MD

## 2018-05-20 NOTE — Patient Instructions (Signed)

## 2018-05-20 NOTE — Telephone Encounter (Signed)
Key: [id number]

## 2018-05-24 ENCOUNTER — Other Ambulatory Visit: Payer: Self-pay | Admitting: Internal Medicine

## 2018-05-24 DIAGNOSIS — E781 Pure hyperglyceridemia: Secondary | ICD-10-CM

## 2018-05-24 MED ORDER — OMEGA-3-ACID ETHYL ESTERS 1 G PO CAPS
2.0000 g | ORAL_CAPSULE | Freq: Two times a day (BID) | ORAL | 1 refills | Status: DC
Start: 2018-05-24 — End: 2019-07-11

## 2018-05-24 NOTE — Telephone Encounter (Signed)
PA for Vascepa has been denied. Is there an alternative that can be sent in.

## 2018-05-25 ENCOUNTER — Telehealth: Payer: Self-pay

## 2018-05-25 NOTE — Telephone Encounter (Addendum)
Key: [id number]

## 2018-05-25 NOTE — Telephone Encounter (Signed)
Key: [id number]

## 2018-05-26 NOTE — Telephone Encounter (Signed)
PA was denied. Stated that pt triglycerides were not above 500 mg/dL  Do you want to change to a different rx?

## 2018-06-02 ENCOUNTER — Encounter: Payer: Self-pay | Admitting: Internal Medicine

## 2018-06-10 ENCOUNTER — Encounter: Payer: Self-pay | Admitting: Internal Medicine

## 2018-06-10 ENCOUNTER — Other Ambulatory Visit: Payer: Self-pay | Admitting: Internal Medicine

## 2018-06-10 DIAGNOSIS — I10 Essential (primary) hypertension: Secondary | ICD-10-CM

## 2018-06-10 MED ORDER — AZILSARTAN MEDOXOMIL 80 MG PO TABS: 1.0000 | ORAL_TABLET | Freq: Every day | ORAL | 1 refills | Status: DC

## 2018-06-23 ENCOUNTER — Encounter: Payer: Self-pay | Admitting: Internal Medicine

## 2018-06-23 DIAGNOSIS — E785 Hyperlipidemia, unspecified: Secondary | ICD-10-CM

## 2018-06-28 ENCOUNTER — Other Ambulatory Visit: Payer: Self-pay | Admitting: Internal Medicine

## 2018-06-28 ENCOUNTER — Encounter: Payer: Self-pay | Admitting: Neurology

## 2018-06-28 ENCOUNTER — Other Ambulatory Visit: Payer: Self-pay | Admitting: *Deleted

## 2018-06-28 DIAGNOSIS — I491 Atrial premature depolarization: Secondary | ICD-10-CM

## 2018-06-28 DIAGNOSIS — M1A09X0 Idiopathic chronic gout, multiple sites, without tophus (tophi): Secondary | ICD-10-CM

## 2018-06-28 DIAGNOSIS — I10 Essential (primary) hypertension: Secondary | ICD-10-CM

## 2018-06-28 MED ORDER — ALLOPURINOL 300 MG PO TABS: 300.0000 mg | ORAL_TABLET | Freq: Every day | ORAL | 3 refills | Status: DC

## 2018-06-28 MED ORDER — IRBESARTAN 300 MG PO TABS: 300.0000 mg | ORAL_TABLET | Freq: Every day | ORAL | 1 refills | Status: DC

## 2018-06-28 MED ORDER — FENOFIBRATE 160 MG PO TABS: 160.0000 mg | ORAL_TABLET | Freq: Every day | ORAL | 3 refills | Status: DC

## 2018-06-28 MED ORDER — METOPROLOL TARTRATE 25 MG PO TABS: 25.0000 mg | ORAL_TABLET | Freq: Two times a day (BID) | ORAL | 3 refills | Status: DC

## 2018-06-28 MED ORDER — SIMVASTATIN 40 MG PO TABS: 40.0000 mg | ORAL_TABLET | Freq: Every day | ORAL | 3 refills | Status: DC

## 2018-06-30 ENCOUNTER — Ambulatory Visit: Payer: BC Managed Care – PPO | Admitting: Neurology

## 2018-06-30 ENCOUNTER — Encounter: Payer: Self-pay | Admitting: Neurology

## 2018-06-30 VITALS — BP 113/74 | HR 58 | Ht 71.0 in | Wt 270.0 lb

## 2018-06-30 DIAGNOSIS — G4733 Obstructive sleep apnea (adult) (pediatric): Secondary | ICD-10-CM | POA: Diagnosis not present

## 2018-06-30 DIAGNOSIS — Z9989 Dependence on other enabling machines and devices: Secondary | ICD-10-CM

## 2018-06-30 NOTE — Patient Instructions (Signed)

## 2018-06-30 NOTE — Progress Notes (Signed)
Subjective:    Patient ID: Rickey Hurley is a 52 y.o. male.  HPI     Interim history:   [name] is a 52 year old right-handed gentleman with an underlying medical history of gout, hypertension, hyperlipidemia, allergic rhinitis, pre-diabetes and obesity, who presents for follow-up consultation of his obstructive sleep apnea after sleep study testing and starting CPAP therapy at home. Patient is unaccompanied today. I first met him on 02/08/2018 at the request of his primary care physician, at which time the patient reported snoring and significant daytime somnolence. He was advised to proceed with sleep study testing. He had a baseline sleep study in April, followed by a CPAP titration study in May 2019. I went over his test results with him in detail today. Baseline sleep study from 03/08/2018 showed a significantly reduced sleep efficiency at 54.4% with a delayed sleep onset at 175 minutes and increase and wake after sleep onset at 122.5 minutes. He had an increased percentage of stage I sleep, and a decreased percentage of REM sleep. Total AHI was in the severe range at 44.3 per hour, REM AHI was 65.1 per hour, with minimal supine sleep achieved. Average oxygen saturation was 97%, nadir was 75% with time below 89% saturation of 61 minutes for the night. He had no significant PLMS. He was advised to return for a full night CPAP titration study which he had on 04/08/2018. Sleep efficiency was 85.8%, sleep latency 19.5 minutes, REM latency 67.5 minutes. He had normal percentages of his sleep stages. He was titrated via full face mask from 5 cm to 13 cm. On the final setting his AHI was 0 per hour, supine REM sleep was achieved and O2 nadir was 95%. Based on his test results I prescribed CPAP therapy for home use a pressure of 13 cm.  Today, 06/30/2018: I reviewed his CPAP compliance data from 05/30/2018 through 06/28/2018 which is a total of 30 days, during which time he used his CPAP every night with  percent used days greater than 4 hours at 87%, indicating very good compliance with an average usage of 6 hours and 10 minutes, residual AHI at goal at 0.3 per hour, leak acceptable with the 95th percentile at 2.4 L/m on a pressure of 13 cm with EPR of 3. He reports doing well on CPAP. Feels much improved in many aspects, including EDS, sleep quality, sleep consolidation, he is quite pleased. He has been working on weight loss and hasn't fact lost over 30 pounds since I met him in March 2019. He is motivated to continue with treatment. He is going to start a new blood pressure medicine.    The patient's allergies, current medications, family history, past medical history, past social history, past surgical history and problem list were reviewed and updated as appropriate.   Previously:   02/08/2018: (He) reports significant daytime somnolence. Symptoms have been ongoing for about 3+ months. He has also struggled with recurrent sinusitis in the past few months. He believes that he may snore a little bit, has woken himself up with some snoring and also rare occasions with gasping sensation. He has had rare acid reflux symptoms at night particularly when sleeping on his stomach. He has a history of bruxism and is wearing a bite guard. Family history is not known to him as he is adopted. He keeps a fairly thick schedule for his bedtime and rise time. Bedtime is sometimes as early as 9:30 but typically before 11 PM, does not take them long  to fall asleep. He does not typically watch TV at that time. He does have a TV in his bedroom but turns it on typically in the mornings. Rise time is 6 AM. He denies telltale symptoms of restless leg syndrome but has had some leg jerking infrequently. Weight has been fluctuating. Recently he tried a weight loss medication but after initial success he plateaued. He does not typically have morning headaches or nocturia on a night to night basis. I reviewed your office note from  01/06/2018 as well as 10/21/2017. His Epworth sleepiness score is 18 out of 24 today, fatigue score is 48 out of 63. He lives alone, he is a professor at [employer]. He has no children, is a nonsmoker, drinks alcohol occasionally, maybe once a month or so, caffeine daily in the form of coffee, up to 20 ounces per day. He has one dog who sleeps on his skin thighs bed with him typically.  His Past Medical History Is Significant For: Past Medical History:  Diagnosis Date  . Anal fistula 12/21/2009  . CLOSTRIDIUM DIFFICILE COLITIS 04/19/2010  . COLONIC POLYPS, HX OF 12/21/2009  . CONJUNCTIVITIS, ACUTE 04/11/2010  . GOUT 12/21/2009  . H/O viral pericarditis   . HYPERLIPIDEMIA 12/21/2009  . HYPERTENSION 12/21/2009  . [facility] 12/21/2009  . SINUSITIS- ACUTE-NOS 04/11/2010    His Past Surgical History Is Significant For: Past Surgical History:  Procedure Laterality Date  . anal lateral inferior sphincterotomy from c diff    . right ankle ORIF      His Family History Is Significant For: Family History  Adopted: Yes  Problem Relation Age of Onset  . Cancer Neg Hx   . Diabetes Neg Hx   . Early death Neg Hx   . Hearing loss Neg Hx   . Heart disease Neg Hx   . Hyperlipidemia Neg Hx   . Hypertension Neg Hx   . Stroke Neg Hx     His Social History Is Significant For: Social History   Socioeconomic History  . Marital status: Single    Spouse name: Not on file  . Number of children: 0  . Years of education: Not on file  . Highest education level: Not on file  Occupational History  . Occupation: vice Press photographer: [employer]  . Financial resource strain: Not on file  . Food insecurity:    Worry: Not on file    Inability: Not on file  . Transportation needs:    Medical: Not on file    Non-medical: Not on file  Tobacco Use  . Smoking status: Never Smoker  . Smokeless tobacco: Never Used  Substance and Sexual Activity  . Alcohol use: Yes    Alcohol/week: 2.4 oz     Types: 4 Shots of liquor per week  . Drug use: No  . Sexual activity: Yes    Birth control/protection: None  Lifestyle  . Physical activity:    Days per week: Not on file    Minutes per session: Not on file  . Stress: Not on file  Relationships  . Social connections:    Talks on phone: Not on file    Gets together: Not on file    Attends religious service: Not on file    Active member of club or organization: Not on file    Attends meetings of clubs or organizations: Not on file    Relationship status: Not on file  Other Topics Concern  .  Not on file  Social History Narrative  . Not on file    His Allergies Are:  Allergies  Allergen Reactions  . Lisinopril Cough  . Moxifloxacin     REACTION: c diff with subsequent bowel resection  :   His Current Medications Are:  Outpatient Encounter Medications as of 06/30/2018  Medication Sig  . allopurinol (ZYLOPRIM) 300 MG tablet Take 1 tablet (300 mg total) by mouth daily.  . Blood Glucose Monitoring Suppl (ONE TOUCH ULTRA 2) w/Device KIT Use to check blood sugars twice a day  . fenofibrate 160 MG tablet Take 1 tablet (160 mg total) by mouth daily.  Marland Kitchen glucose blood (COOL BLOOD GLUCOSE TEST STRIPS) test strip Use as instructed to test blood sugar twice daily. DX E11.9  . Insulin Pen Needle (NOVOFINE) 32G X 6 MM MISC 1 Act by Does not apply route daily.  . irbesartan (AVAPRO) 300 MG tablet Take 1 tablet (300 mg total) by mouth daily.  . metoprolol tartrate (LOPRESSOR) 25 MG tablet Take 1 tablet (25 mg total) by mouth 2 (two) times daily.  Marland Kitchen omega-3 acid ethyl esters (LOVAZA) 1 g capsule Take 2 capsules (2 g total) by mouth 2 (two) times daily.  [name] DELICA LANCETS 73X MISC Use to check blood sugars twice a day  . SAXENDA 18 MG/3ML SOPN INJECT 3 MG INTO THE SKIN DAILY  . simvastatin (ZOCOR) 40 MG tablet Take 1 tablet (40 mg total) by mouth daily.   No facility-administered encounter medications on file as of 06/30/2018.    :  Review of Systems:  Out of a complete 14 point review of systems, all are reviewed and negative with the exception of these symptoms as listed below:  Review of Systems  Neurological:       Patient reports that he has been doing well with his machine.     Objective:  Neurological Exam  Physical Exam Physical Examination:   Vitals:   06/30/18 1240  BP: 113/74  Pulse: (!) 58   General Examination: The patient is a very pleasant 52 y.o. male in no acute distress. He appears well-developed and well-nourished and well groomed.   HEENT: Normocephalic, atraumatic, pupils are equal, round and reactive to light and accommodation. Extraocular tracking is good without limitation to gaze excursion or nystagmus noted. Normal smooth pursuit is noted. Hearing is grossly intact. Face is symmetric with normal facial animation and normal facial sensation. Speech is clear with no dysarthria noted. There is no hypophonia. There is no lip, neck/head, jaw or voice tremor. Neck is supple with full range of passive and active motion. Oropharynx exam reveals: mild mouth dryness, adequate dental hygiene and moderate airway crowding. Tongue protrudes centrally and palate elevates symmetrically.  Chest: Clear to auscultation without wheezing, rhonchi or crackles noted.  Heart: S1+S2+0, regular and normal without murmurs, rubs or gallops noted.   Abdomen: Soft, non-tender and non-distended with normal bowel sounds appreciated on auscultation.  Extremities: There is no pitting edema in the distal lower extremities bilaterally.   Skin: Warm and dry without trophic changes noted.  Musculoskeletal: exam reveals no obvious joint deformities, tenderness or joint swelling or erythema.   Neurologically:  Mental status: The patient is awake, alert and oriented in all 4 spheres. His immediate and remote memory, attention, language skills and fund of knowledge are appropriate. There is no evidence of  aphasia, agnosia, apraxia or anomia. Speech is clear with normal prosody and enunciation. Thought process is linear. Mood is normal  and affect is normal.  Cranial nerves II - XII are as described above under HEENT exam.  Motor exam: Normal bulk, strength and tone is noted. There is no tremor. Romberg is negative. Fine motor skills and coordination: grossly intact.  Cerebellar testing: No dysmetria or intention tremor. There is no truncal or gait ataxia.  Sensory exam: intact to light touch in the upper and lower extremities.  Gait, station and balance: He stands easily. No veering to one side is noted. No leaning to one side is noted. Posture is age-appropriate and stance is narrow based. Gait shows normal stride length and normal pace. No problems turning are noted. Tandem walk is unremarkable. `  Assessment and Plan:   In summary, Red Mandt is a very pleasant 52 year old male with an underlying medical history of gout, hypertension, hyperlipidemia, allergic rhinitis, pre-diabetes and obesity with BMI of over 40, who presents for follow-up consultation of his obstructive sleep apnea. His baseline sleep study from 03/08/2018 showed evidence of severe obstructive sleep apnea. He return for a CPAP titration study on 04/08/2018 which showed good results with CPAP of 13 cm. He is compliant with treatment and indicates good results including improvement of his daytime somnolence, sleep quality and sleep consolidation as well as snoring of course. He is highly commended for his treatment adherence, he is congratulated on his weight loss success as well. He has lost over 30 pounds in the past few months. He is motivated to continue with CPAP therapy and also to continue striving for weight loss. His blood pressure looks great today. He is supposed to start a new medication as well for this. He may find that his blood pressure numbers improved with time as he is losing weight and as he is compliant with CPAP  therapy. We talked about the importance of compliance with CPAP. We talked about the importance of healthy lifestyle and weight management today. We reviewed his sleep study results in detail as well as his compliance data together. I suggested a routine follow-up in 6 months, sooner if needed. Hopefully, he can follow-up yearly after that and at some point if he continues to lose weight we can also revisit his sleep apnea diagnosis and consider another sleep study for recheck. For now, he will follow-up in 6 months. I answered all his questions today and he was in agreement. I spent 25 minutes in total face-to-face time with the patient, more than 50% of which was spent in counseling and coordination of care, reviewing test results, reviewing medication and discussing or reviewing the diagnosis of OSA, its prognosis and treatment options. Pertinent laboratory and imaging test results that were available during this visit with the patient were reviewed by me and considered in my medical decision making (see chart for details).

## 2018-07-05 MED ORDER — SAXENDA 18 MG/3ML ~~LOC~~ SOPN: 3.0000 mg | PEN_INJECTOR | Freq: Every day | SUBCUTANEOUS | 3 refills | Status: DC

## 2018-07-22 ENCOUNTER — Other Ambulatory Visit: Payer: Self-pay | Admitting: Internal Medicine

## 2018-07-22 MED ORDER — SAXENDA 18 MG/3ML ~~LOC~~ SOPN: 3.0000 mg | PEN_INJECTOR | Freq: Every day | SUBCUTANEOUS | 1 refills | Status: DC

## 2018-08-05 NOTE — Telephone Encounter (Signed)
PA was denied

## 2018-09-10 ENCOUNTER — Other Ambulatory Visit: Payer: Self-pay | Admitting: Internal Medicine

## 2018-09-10 DIAGNOSIS — H00024 Hordeolum internum left upper eyelid: Secondary | ICD-10-CM

## 2018-10-15 ENCOUNTER — Encounter: Payer: Self-pay | Admitting: Internal Medicine

## 2018-10-15 ENCOUNTER — Other Ambulatory Visit: Payer: Self-pay | Admitting: Internal Medicine

## 2018-10-15 DIAGNOSIS — N410 Acute prostatitis: Secondary | ICD-10-CM

## 2018-10-15 MED ORDER — SULFAMETHOXAZOLE-TRIMETHOPRIM 800-160 MG PO TABS: 1.0000 | ORAL_TABLET | Freq: Two times a day (BID) | ORAL | 0 refills | Status: AC

## 2018-11-22 ENCOUNTER — Other Ambulatory Visit: Payer: Self-pay | Admitting: Internal Medicine

## 2018-11-22 DIAGNOSIS — I10 Essential (primary) hypertension: Secondary | ICD-10-CM

## 2018-11-22 DIAGNOSIS — I491 Atrial premature depolarization: Secondary | ICD-10-CM

## 2018-11-22 DIAGNOSIS — E785 Hyperlipidemia, unspecified: Secondary | ICD-10-CM

## 2018-11-22 DIAGNOSIS — M1A09X0 Idiopathic chronic gout, multiple sites, without tophus (tophi): Secondary | ICD-10-CM

## 2018-11-22 MED ORDER — FENOFIBRATE 160 MG PO TABS: 160.0000 mg | ORAL_TABLET | Freq: Every day | ORAL | 1 refills | Status: DC

## 2018-11-22 MED ORDER — METOPROLOL TARTRATE 25 MG PO TABS: 25.0000 mg | ORAL_TABLET | Freq: Two times a day (BID) | ORAL | 1 refills | Status: DC

## 2018-11-22 MED ORDER — IRBESARTAN 300 MG PO TABS: 300.0000 mg | ORAL_TABLET | Freq: Every day | ORAL | 1 refills | Status: DC

## 2018-11-22 MED ORDER — SIMVASTATIN 40 MG PO TABS: 40.0000 mg | ORAL_TABLET | Freq: Every day | ORAL | 1 refills | Status: DC

## 2018-11-22 MED ORDER — ALLOPURINOL 300 MG PO TABS: 300.0000 mg | ORAL_TABLET | Freq: Every day | ORAL | 1 refills | Status: DC

## 2018-12-06 ENCOUNTER — Other Ambulatory Visit: Payer: Self-pay | Admitting: Internal Medicine

## 2018-12-06 DIAGNOSIS — I10 Essential (primary) hypertension: Secondary | ICD-10-CM

## 2019-01-03 ENCOUNTER — Ambulatory Visit: Payer: BC Managed Care – PPO | Admitting: Neurology

## 2019-01-13 ENCOUNTER — Other Ambulatory Visit: Payer: Self-pay | Admitting: Internal Medicine

## 2019-02-04 ENCOUNTER — Other Ambulatory Visit: Payer: Self-pay | Admitting: Internal Medicine

## 2019-02-04 DIAGNOSIS — I10 Essential (primary) hypertension: Secondary | ICD-10-CM

## 2019-02-04 DIAGNOSIS — I491 Atrial premature depolarization: Secondary | ICD-10-CM

## 2019-02-04 DIAGNOSIS — E785 Hyperlipidemia, unspecified: Secondary | ICD-10-CM

## 2019-02-04 DIAGNOSIS — M1A09X0 Idiopathic chronic gout, multiple sites, without tophus (tophi): Secondary | ICD-10-CM

## 2019-06-23 ENCOUNTER — Other Ambulatory Visit: Payer: Self-pay | Admitting: Internal Medicine

## 2019-06-23 DIAGNOSIS — I10 Essential (primary) hypertension: Secondary | ICD-10-CM

## 2019-06-24 ENCOUNTER — Encounter: Payer: Self-pay | Admitting: Internal Medicine

## 2019-07-09 ENCOUNTER — Other Ambulatory Visit: Payer: Self-pay | Admitting: Internal Medicine

## 2019-07-09 DIAGNOSIS — I10 Essential (primary) hypertension: Secondary | ICD-10-CM

## 2019-07-11 ENCOUNTER — Other Ambulatory Visit (INDEPENDENT_AMBULATORY_CARE_PROVIDER_SITE_OTHER): Payer: BLUE CROSS/BLUE SHIELD

## 2019-07-11 ENCOUNTER — Encounter: Payer: Self-pay | Admitting: Internal Medicine

## 2019-07-11 ENCOUNTER — Ambulatory Visit (INDEPENDENT_AMBULATORY_CARE_PROVIDER_SITE_OTHER): Payer: BLUE CROSS/BLUE SHIELD | Admitting: Internal Medicine

## 2019-07-11 ENCOUNTER — Other Ambulatory Visit: Payer: Self-pay

## 2019-07-11 VITALS — BP 136/82 | HR 60 | Temp 98.1°F | Resp 16 | Ht 71.0 in | Wt 303.8 lb

## 2019-07-11 DIAGNOSIS — E781 Pure hyperglyceridemia: Secondary | ICD-10-CM

## 2019-07-11 DIAGNOSIS — E785 Hyperlipidemia, unspecified: Secondary | ICD-10-CM

## 2019-07-11 DIAGNOSIS — I10 Essential (primary) hypertension: Secondary | ICD-10-CM

## 2019-07-11 DIAGNOSIS — M1000 Idiopathic gout, unspecified site: Secondary | ICD-10-CM

## 2019-07-11 DIAGNOSIS — R7303 Prediabetes: Secondary | ICD-10-CM

## 2019-07-11 DIAGNOSIS — E559 Vitamin D deficiency, unspecified: Secondary | ICD-10-CM

## 2019-07-11 DIAGNOSIS — Z0000 Encounter for general adult medical examination without abnormal findings: Secondary | ICD-10-CM | POA: Diagnosis not present

## 2019-07-11 DIAGNOSIS — H9313 Tinnitus, bilateral: Secondary | ICD-10-CM

## 2019-07-11 LAB — URINALYSIS, ROUTINE W REFLEX MICROSCOPIC
Bilirubin Urine: NEGATIVE
Hgb urine dipstick: NEGATIVE
Ketones, ur: NEGATIVE
Leukocytes,Ua: NEGATIVE
Nitrite: NEGATIVE
RBC / HPF: NONE SEEN (ref 0–?)
Specific Gravity, Urine: 1.01 (ref 1.000–1.030)
Total Protein, Urine: NEGATIVE
Urine Glucose: NEGATIVE
Urobilinogen, UA: 0.2 (ref 0.0–1.0)
pH: 6 (ref 5.0–8.0)

## 2019-07-11 LAB — HEPATIC FUNCTION PANEL
ALT: 42 U/L (ref 0–53)
AST: 29 U/L (ref 0–37)
Albumin: 4.3 g/dL (ref 3.5–5.2)
Alkaline Phosphatase: 58 U/L (ref 39–117)
Bilirubin, Direct: 0.1 mg/dL (ref 0.0–0.3)
Total Bilirubin: 0.4 mg/dL (ref 0.2–1.2)
Total Protein: 6.5 g/dL (ref 6.0–8.3)

## 2019-07-11 LAB — BASIC METABOLIC PANEL
BUN: 10 mg/dL (ref 6–23)
CO2: 24 mEq/L (ref 19–32)
Calcium: 9.3 mg/dL (ref 8.4–10.5)
Chloride: 108 mEq/L (ref 96–112)
Creatinine, Ser: 0.92 mg/dL (ref 0.40–1.50)
GFR: 86.25 mL/min (ref >60.00)
Glucose, Bld: 105 mg/dL — ABNORMAL HIGH (ref 70–99)
Potassium: 4.2 mEq/L (ref 3.5–5.1)
Sodium: 141 mEq/L (ref 135–145)

## 2019-07-11 LAB — CBC WITH DIFFERENTIAL/PLATELET
Basophils Absolute: 0.1 10*3/uL (ref 0.0–0.1)
Basophils Relative: 0.8 % (ref 0.0–3.0)
Eosinophils Absolute: 0.2 10*3/uL (ref 0.0–0.7)
Eosinophils Relative: 2.4 % (ref 0.0–5.0)
HCT: 38.3 % — ABNORMAL LOW (ref 39.0–52.0)
Hemoglobin: 13 g/dL (ref 13.0–17.0)
Lymphocytes Relative: 27.1 % (ref 12.0–46.0)
Lymphs Abs: 1.8 10*3/uL (ref 0.7–4.0)
MCHC: 34 g/dL (ref 30.0–36.0)
MCV: 90.7 fl (ref 78.0–100.0)
Monocytes Absolute: 0.6 10*3/uL (ref 0.1–1.0)
Monocytes Relative: 9.1 % (ref 3.0–12.0)
Neutro Abs: 4.1 10*3/uL (ref 1.4–7.7)
Neutrophils Relative %: 60.6 % (ref 43.0–77.0)
Platelets: 295 10*3/uL (ref 150.0–400.0)
RBC: 4.23 Mil/uL (ref 4.22–5.81)
RDW: 13.7 % (ref 11.5–15.5)
WBC: 6.7 10*3/uL (ref 4.0–10.5)

## 2019-07-11 LAB — HEMOGLOBIN A1C: Hgb A1c MFr Bld: 5.6 % (ref 4.6–6.5)

## 2019-07-11 LAB — PSA: PSA: 0.5 ng/mL (ref 0.10–4.00)

## 2019-07-11 LAB — URIC ACID: Uric Acid, Serum: 4.9 mg/dL (ref 4.0–7.8)

## 2019-07-11 LAB — LIPID PANEL
Cholesterol: 166 mg/dL (ref 0–200)
HDL: 40.7 mg/dL (ref >39.00)
LDL Cholesterol: 99 mg/dL (ref 0–99)
NonHDL: 125.65
Total CHOL/HDL Ratio: 4
Triglycerides: 132 mg/dL (ref 0.0–149.0)
VLDL: 26.4 mg/dL (ref 0.0–40.0)

## 2019-07-11 LAB — VITAMIN D 25 HYDROXY (VIT D DEFICIENCY, FRACTURES): VITD: 24.35 ng/mL — ABNORMAL LOW (ref 30.00–100.00)

## 2019-07-11 LAB — TSH: TSH: 2 u[IU]/mL (ref 0.35–4.50)

## 2019-07-11 MED ORDER — CHOLECALCIFEROL 50 MCG (2000 UT) PO TABS: 1.0000 | ORAL_TABLET | Freq: Every day | ORAL | 1 refills | Status: DC

## 2019-07-11 NOTE — Patient Instructions (Signed)

## 2019-07-11 NOTE — Progress Notes (Signed)
Subjective:  Patient ID: Rickey Hurley, male    DOB: February 08, 1966  Age: 53 y.o. MRN: [id number]  CC: Annual Exam, Hypertension, and Hyperlipidemia   HPI Rickey Hurley presents for a CPX.  He complains of weight gain but otherwise feels well and offers no other complaints.  He is active and denies any recent episodes of CP, DOE, palpitations, edema, or fatigue.  He complains of a several month history of ringing in his ears with no loss of hearing.  Outpatient Medications Prior to Visit  Medication Sig Dispense Refill  . allopurinol (ZYLOPRIM) 300 MG tablet TAKE 1 TABLET BY MOUTH  DAILY 90 tablet 1  . Blood Glucose Monitoring Suppl (ONE TOUCH ULTRA 2) w/Device KIT Use to check blood sugars twice a day 1 each 0  . fenofibrate 160 MG tablet TAKE 1 TABLET BY MOUTH  DAILY 90 tablet 1  . glucose blood (COOL BLOOD GLUCOSE TEST STRIPS) test strip Use as instructed to test blood sugar twice daily. DX E11.9 200 each 3  . irbesartan (AVAPRO) 300 MG tablet TAKE 1 TABLET BY MOUTH  DAILY 90 tablet 0  . metoprolol tartrate (LOPRESSOR) 25 MG tablet TAKE 1 TABLET BY MOUTH TWO  TIMES DAILY 180 tablet 1  . ONETOUCH DELICA LANCETS 35W MISC Use to check blood sugars twice a day 300 each 3  . simvastatin (ZOCOR) 40 MG tablet TAKE 1 TABLET BY MOUTH  DAILY 90 tablet 1  . Insulin Pen Needle (NOVOFINE) 32G X 6 MM MISC 1 Act by Does not apply route daily. 100 each 3  . omega-3 acid ethyl esters (LOVAZA) 1 g capsule Take 2 capsules (2 g total) by mouth 2 (two) times daily. 360 capsule 1  . SAXENDA 18 MG/3ML SOPN INJECT 3 MG INTO THE SKIN DAILY. 45 mL 1   No facility-administered medications prior to visit.     ROS Review of Systems  Constitutional: Positive for unexpected weight change. Negative for chills, diaphoresis and fatigue.  HENT: Positive for tinnitus. Negative for congestion, ear pain, facial swelling, sinus pain, sore throat and trouble swallowing.   Eyes: Negative.   Respiratory: Negative for cough,  chest tightness, shortness of breath and wheezing.   Cardiovascular: Negative for chest pain, palpitations and leg swelling.  Gastrointestinal: Negative for abdominal pain, blood in stool, constipation, diarrhea, nausea, rectal pain and vomiting.  Endocrine: Negative.   Genitourinary: Negative.  Negative for difficulty urinating, dysuria, penile swelling, scrotal swelling and testicular pain.  Musculoskeletal: Negative.  Negative for arthralgias and myalgias.  Skin: Negative.  Negative for color change and pallor.  Neurological: Negative.  Negative for dizziness, weakness, light-headedness, numbness and headaches.  Hematological: Negative for adenopathy. Does not bruise/bleed easily.  Psychiatric/Behavioral: Negative.     Objective:  BP 136/82 (BP Location: Left Arm, Patient Position: Sitting, Cuff Size: Large)   Pulse 60   Temp 98.1 F (36.7 C) (Oral)   Resp 16   Ht 5' 11"  (1.803 m)   Wt (!) 303 lb 12 oz (137.8 kg)   SpO2 97%   BMI 42.36 kg/m   BP Readings from Last 3 Encounters:  07/11/19 136/82  06/30/18 113/74  05/20/18 (!) 142/90    Wt Readings from Last 3 Encounters:  07/11/19 (!) 303 lb 12 oz (137.8 kg)  06/30/18 270 lb (122.5 kg)  05/20/18 280 lb (127 kg)    Physical Exam Vitals signs reviewed.  Constitutional:      Appearance: He is obese. He is not ill-appearing or diaphoretic.  HENT:     Right Ear: Hearing, tympanic membrane, ear canal and external ear normal.     Left Ear: Hearing, tympanic membrane, ear canal and external ear normal.     Nose: Nose normal.     Mouth/Throat:     Mouth: Mucous membranes are moist.  Eyes:     General: No scleral icterus.    Conjunctiva/sclera: Conjunctivae normal.  Neck:     Musculoskeletal: Normal range of motion. No neck rigidity or muscular tenderness.  Cardiovascular:     Rate and Rhythm: Normal rate and regular rhythm.     Heart sounds: No murmur.  Pulmonary:     Effort: Pulmonary effort is normal. No respiratory  distress.     Breath sounds: No stridor. No wheezing, rhonchi or rales.  Abdominal:     General: Abdomen is protuberant. Bowel sounds are normal.     Palpations: There is no hepatomegaly or splenomegaly.     Tenderness: There is no abdominal tenderness.     Hernia: No hernia is present. There is no hernia in the left inguinal area or right inguinal area.  Genitourinary:    Pubic Area: No rash.      Penis: Normal. No discharge, swelling or lesions.      Scrotum/Testes: Normal.        Right: Mass, tenderness or swelling not present.        Left: Mass, tenderness or swelling not present.     Epididymis:     Right: Normal. Not inflamed or enlarged. No mass or tenderness.     Left: Normal. Not inflamed or enlarged. No mass or tenderness.     Prostate: Normal. Not enlarged, not tender and no nodules present.     Rectum: Guaiac result negative. Internal hemorrhoid present. No mass, tenderness, anal fissure or external hemorrhoid. Normal anal tone.  Musculoskeletal: Normal range of motion.     Right lower leg: No edema.     Left lower leg: No edema.  Lymphadenopathy:     Cervical: No cervical adenopathy.     Lower Body: No right inguinal adenopathy. No left inguinal adenopathy.  Skin:    General: Skin is warm and dry.  Neurological:     General: No focal deficit present.     Mental Status: He is alert and oriented to person, place, and time. Mental status is at baseline.  Psychiatric:        Mood and Affect: Mood normal.        Thought Content: Thought content normal.        Judgment: Judgment normal.     Lab Results  Component Value Date   WBC 6.7 07/11/2019   HGB 13.0 07/11/2019   HCT 38.3 (L) 07/11/2019   PLT 295.0 07/11/2019   GLUCOSE 105 (H) 07/11/2019   CHOL 166 07/11/2019   TRIG 132.0 07/11/2019   HDL 40.70 07/11/2019   LDLDIRECT 69.0 05/20/2018   LDLCALC 99 07/11/2019   ALT 42 07/11/2019   AST 29 07/11/2019   NA 141 07/11/2019   K 4.2 07/11/2019   CL 108 07/11/2019    CREATININE 0.92 07/11/2019   BUN 10 07/11/2019   CO2 24 07/11/2019   TSH 2.00 07/11/2019   PSA 0.50 07/11/2019   HGBA1C 5.6 07/11/2019    Dg Chest 2 View  Result Date: 02/17/2018 CLINICAL DATA:  Cough, congestion, shortness of breath and fever for 3 weeks, history hypertension EXAM: CHEST - 2 VIEW COMPARISON:  09/30/2017 FINDINGS: Normal heart size,  mediastinal contours, and pulmonary vascularity. Mild chronic bronchitic changes. Lungs otherwise clear. No pleural effusion or pneumothorax. Bones unremarkable. IMPRESSION: Chronic bronchitic changes without infiltrate. Electronically Signed   By: Lavonia Dana M.D.   On: 02/17/2018 15:46    Assessment & Plan:   Davarius was seen today for annual exam, hypertension and hyperlipidemia.  Diagnoses and all orders for this visit:  Essential hypertension- His blood pressure is adequately well controlled.  Will continue the current doses of metoprolol and irbesartan. -     CBC with Differential/Platelet; Future -     Basic metabolic panel; Future -     TSH; Future -     Urinalysis, Routine w reflex microscopic; Future -     VITAMIN D 25 Hydroxy (Vit-D Deficiency, Fractures); Future  Idiopathic gout, unspecified chronicity, unspecified site- He has achieved his uric acid goal of less than 6 and is not having any gout attacks.  Will continue the current dose of allopurinol. -     Uric acid; Future  Hyperlipidemia with target LDL less than 130- He has achieved his LDL goal and is doing well on the statin. -     Hepatic function panel; Future  Morbid obesity due to excess calories [facility])- He is committed to working on his lifestyle modifications regarding this.  Prediabetes- His A1c is mildly elevated at 5.6.  Medical therapy is not indicated. -     Hemoglobin A1c; Future  Preventative health care- Exam completed, labs reviewed, vaccines reviewed, colon cancer screening is up-to-date, patient education was given. -     Lipid panel; Future -      PSA; Future -     HIV Antibody (routine testing w rflx); Future  Pure hyperglyceridemia- His triglycerides are normal now.  Tinnitus of both ears- I have asked him to see audiology about this. -     Ambulatory referral to Audiology  Vitamin D deficiency -     Cholecalciferol 50 MCG (2000 UT) TABS; Take 1 tablet (2,000 Units total) by mouth daily.   I have discontinued Cristan Cottam's Insulin Pen Needle, omega-3 acid ethyl esters, and Saxenda. I am also having him start on Cholecalciferol. Additionally, I am having him maintain his OneTouch Delica Lancets 74F, ONE TOUCH ULTRA 2, glucose blood, simvastatin, metoprolol tartrate, fenofibrate, allopurinol, and irbesartan.  Meds ordered this encounter  Medications  . Cholecalciferol 50 MCG (2000 UT) TABS    Sig: Take 1 tablet (2,000 Units total) by mouth daily.    Dispense:  90 tablet    Refill:  1     Follow-up: Return in about 6 months (around 01/11/2020).  Scarlette Calico, MD

## 2019-07-12 ENCOUNTER — Encounter: Payer: Self-pay | Admitting: Internal Medicine

## 2019-07-12 LAB — HIV ANTIBODY (ROUTINE TESTING W REFLEX): HIV 1&2 Ab, 4th Generation: NONREACTIVE

## 2019-07-13 ENCOUNTER — Other Ambulatory Visit: Payer: Self-pay | Admitting: Internal Medicine

## 2019-07-13 DIAGNOSIS — I10 Essential (primary) hypertension: Secondary | ICD-10-CM

## 2019-08-24 ENCOUNTER — Encounter: Payer: Self-pay | Admitting: Internal Medicine

## 2019-08-24 ENCOUNTER — Other Ambulatory Visit: Payer: Self-pay | Admitting: Internal Medicine

## 2019-08-24 DIAGNOSIS — R21 Rash and other nonspecific skin eruption: Secondary | ICD-10-CM

## 2019-08-24 DIAGNOSIS — L219 Seborrheic dermatitis, unspecified: Secondary | ICD-10-CM

## 2019-08-24 MED ORDER — KETOCONAZOLE 2 % EX CREA: TOPICAL_CREAM | Freq: Every day | CUTANEOUS | 2 refills | Status: DC

## 2019-09-05 ENCOUNTER — Other Ambulatory Visit: Payer: Self-pay | Admitting: Internal Medicine

## 2019-09-05 DIAGNOSIS — M1A09X0 Idiopathic chronic gout, multiple sites, without tophus (tophi): Secondary | ICD-10-CM

## 2019-09-05 DIAGNOSIS — I10 Essential (primary) hypertension: Secondary | ICD-10-CM

## 2019-09-05 DIAGNOSIS — I491 Atrial premature depolarization: Secondary | ICD-10-CM

## 2019-09-05 DIAGNOSIS — E785 Hyperlipidemia, unspecified: Secondary | ICD-10-CM

## 2019-09-19 ENCOUNTER — Other Ambulatory Visit: Payer: Self-pay | Admitting: Internal Medicine

## 2019-09-19 DIAGNOSIS — L219 Seborrheic dermatitis, unspecified: Secondary | ICD-10-CM

## 2019-09-19 MED ORDER — KETOCONAZOLE 2 % EX CREA: TOPICAL_CREAM | Freq: Every day | CUTANEOUS | 2 refills | Status: AC

## 2019-09-20 DIAGNOSIS — Z23 Encounter for immunization: Secondary | ICD-10-CM | POA: Diagnosis not present

## 2019-11-24 ENCOUNTER — Encounter: Payer: Self-pay | Admitting: Internal Medicine

## 2019-12-08 ENCOUNTER — Encounter: Payer: Self-pay | Admitting: Internal Medicine

## 2019-12-23 ENCOUNTER — Other Ambulatory Visit: Payer: Self-pay | Admitting: Internal Medicine

## 2019-12-23 DIAGNOSIS — M1A09X0 Idiopathic chronic gout, multiple sites, without tophus (tophi): Secondary | ICD-10-CM

## 2019-12-23 DIAGNOSIS — I10 Essential (primary) hypertension: Secondary | ICD-10-CM

## 2019-12-23 DIAGNOSIS — I491 Atrial premature depolarization: Secondary | ICD-10-CM

## 2019-12-23 DIAGNOSIS — E785 Hyperlipidemia, unspecified: Secondary | ICD-10-CM

## 2019-12-23 MED ORDER — SIMVASTATIN 40 MG PO TABS: 40.0000 mg | ORAL_TABLET | Freq: Every day | ORAL | 0 refills | Status: DC

## 2019-12-23 MED ORDER — ALLOPURINOL 300 MG PO TABS: 300.0000 mg | ORAL_TABLET | Freq: Every day | ORAL | 0 refills | Status: DC

## 2019-12-23 MED ORDER — METOPROLOL TARTRATE 25 MG PO TABS: 25.0000 mg | ORAL_TABLET | Freq: Two times a day (BID) | ORAL | 0 refills | Status: DC

## 2020-01-29 ENCOUNTER — Ambulatory Visit: Payer: BLUE CROSS/BLUE SHIELD | Attending: Internal Medicine

## 2020-01-29 DIAGNOSIS — Z23 Encounter for immunization: Secondary | ICD-10-CM | POA: Insufficient documentation

## 2020-01-29 NOTE — Progress Notes (Signed)
   Covid-[facility] Vaccination [facility]  Name:  Rickey Hurley    MRN: [id number] DOB: [birth year]  01/29/2020  [name] was observed post Covid-19 immunization for 15 minutes without incident. He was provided with Vaccine Information Sheet and instruction to access the V-Safe system.   [name] was instructed to call 911 with any severe reactions post vaccine: Marland Kitchen Difficulty breathing  . Swelling of face and throat  . A fast heartbeat  . A bad rash all over body  . Dizziness and weakness   Immunizations Administered    Name Date Dose VIS Date Route   Pfizer COVID-19 Vaccine 01/29/2020 10:33 AM 0.3 mL 11/04/2019 Intramuscular   Manufacturer: [employer], [employer]   Lot: [id number]   NDC: [address]-1

## 2020-02-28 ENCOUNTER — Ambulatory Visit: Payer: BLUE CROSS/BLUE SHIELD

## 2020-05-14 ENCOUNTER — Other Ambulatory Visit: Payer: Self-pay

## 2020-05-14 ENCOUNTER — Ambulatory Visit (INDEPENDENT_AMBULATORY_CARE_PROVIDER_SITE_OTHER): Payer: BLUE CROSS/BLUE SHIELD

## 2020-05-14 ENCOUNTER — Encounter: Payer: Self-pay | Admitting: Internal Medicine

## 2020-05-14 ENCOUNTER — Other Ambulatory Visit: Payer: Self-pay | Admitting: Internal Medicine

## 2020-05-14 ENCOUNTER — Ambulatory Visit: Payer: BLUE CROSS/BLUE SHIELD | Admitting: Internal Medicine

## 2020-05-14 VITALS — BP 148/96 | HR 60 | Temp 97.9°F | Resp 16 | Ht 71.0 in | Wt 324.0 lb

## 2020-05-14 DIAGNOSIS — E559 Vitamin D deficiency, unspecified: Secondary | ICD-10-CM | POA: Diagnosis not present

## 2020-05-14 DIAGNOSIS — I10 Essential (primary) hypertension: Secondary | ICD-10-CM

## 2020-05-14 DIAGNOSIS — M25552 Pain in left hip: Secondary | ICD-10-CM

## 2020-05-14 DIAGNOSIS — G8929 Other chronic pain: Secondary | ICD-10-CM

## 2020-05-14 DIAGNOSIS — I491 Atrial premature depolarization: Secondary | ICD-10-CM

## 2020-05-14 DIAGNOSIS — M5442 Lumbago with sciatica, left side: Secondary | ICD-10-CM

## 2020-05-14 DIAGNOSIS — E785 Hyperlipidemia, unspecified: Secondary | ICD-10-CM

## 2020-05-14 DIAGNOSIS — M5136 Other intervertebral disc degeneration, lumbar region: Secondary | ICD-10-CM | POA: Diagnosis not present

## 2020-05-14 DIAGNOSIS — M1612 Unilateral primary osteoarthritis, left hip: Secondary | ICD-10-CM

## 2020-05-14 DIAGNOSIS — M1712 Unilateral primary osteoarthritis, left knee: Secondary | ICD-10-CM | POA: Diagnosis not present

## 2020-05-14 LAB — CBC WITH DIFFERENTIAL/PLATELET
Basophils Absolute: 0.1 10*3/uL (ref 0.0–0.1)
Basophils Relative: 0.9 % (ref 0.0–3.0)
Eosinophils Absolute: 0.1 10*3/uL (ref 0.0–0.7)
Eosinophils Relative: 2.1 % (ref 0.0–5.0)
HCT: 37.9 % — ABNORMAL LOW (ref 39.0–52.0)
Hemoglobin: 13.1 g/dL (ref 13.0–17.0)
Lymphocytes Relative: 31.3 % (ref 12.0–46.0)
Lymphs Abs: 2.1 10*3/uL (ref 0.7–4.0)
MCHC: 34.5 g/dL (ref 30.0–36.0)
MCV: 88.5 fl (ref 78.0–100.0)
Monocytes Absolute: 0.6 10*3/uL (ref 0.1–1.0)
Monocytes Relative: 8.4 % (ref 3.0–12.0)
Neutro Abs: 3.9 10*3/uL (ref 1.4–7.7)
Neutrophils Relative %: 57.3 % (ref 43.0–77.0)
Platelets: 259 10*3/uL (ref 150.0–400.0)
RBC: 4.28 Mil/uL (ref 4.22–5.81)
RDW: 13.8 % (ref 11.5–15.5)
WBC: 6.8 10*3/uL (ref 4.0–10.5)

## 2020-05-14 LAB — BASIC METABOLIC PANEL
BUN: 13 mg/dL (ref 6–23)
CO2: 26 mEq/L (ref 19–32)
Calcium: 9.7 mg/dL (ref 8.4–10.5)
Chloride: 106 mEq/L (ref 96–112)
Creatinine, Ser: 0.93 mg/dL (ref 0.40–1.50)
GFR: 84.9 mL/min (ref >60.00)
Glucose, Bld: 92 mg/dL (ref 70–99)
Potassium: 4 mEq/L (ref 3.5–5.1)
Sodium: 139 mEq/L (ref 135–145)

## 2020-05-14 LAB — VITAMIN D 25 HYDROXY (VIT D DEFICIENCY, FRACTURES): VITD: 34.87 ng/mL (ref 30.00–100.00)

## 2020-05-14 MED ORDER — FENOFIBRATE 160 MG PO TABS: 160.0000 mg | ORAL_TABLET | Freq: Every day | ORAL | 1 refills | Status: DC

## 2020-05-14 MED ORDER — SIMVASTATIN 40 MG PO TABS: 40.0000 mg | ORAL_TABLET | Freq: Every day | ORAL | 0 refills | Status: DC

## 2020-05-14 MED ORDER — ETODOLAC ER 400 MG PO TB24: 400.0000 mg | ORAL_TABLET | Freq: Every day | ORAL | 0 refills | Status: DC

## 2020-05-14 MED ORDER — IRBESARTAN 300 MG PO TABS: 300.0000 mg | ORAL_TABLET | Freq: Every day | ORAL | 1 refills | Status: DC

## 2020-05-14 MED ORDER — INDAPAMIDE 1.25 MG PO TABS: 1.2500 mg | ORAL_TABLET | Freq: Every day | ORAL | 0 refills | Status: DC

## 2020-05-14 MED ORDER — CHOLECALCIFEROL 50 MCG (2000 UT) PO TABS: 1.0000 | ORAL_TABLET | Freq: Every day | ORAL | 1 refills | Status: DC

## 2020-05-14 MED ORDER — TIZANIDINE HCL 2 MG PO CAPS: 2.0000 mg | ORAL_CAPSULE | Freq: Three times a day (TID) | ORAL | 2 refills | Status: DC | PRN

## 2020-05-14 MED ORDER — METOPROLOL TARTRATE 25 MG PO TABS: 25.0000 mg | ORAL_TABLET | Freq: Two times a day (BID) | ORAL | 0 refills | Status: DC

## 2020-05-14 NOTE — Progress Notes (Signed)
Subjective:  Patient ID: Rickey Hurley, male    DOB: 1966-05-07  Age: 54 y.o. MRN: [id number]  CC: Hypertension, Back Pain, and Osteoarthritis  This visit occurred during the SARS-CoV-2 public health emergency.  Safety protocols were in place, including screening questions prior to the visit, additional usage of staff PPE, and extensive cleaning of exam room while observing appropriate contact time as indicated for disinfecting solutions.    HPI Rossi Burdo presents for f/up - He complains of an 74-monthhistory of worsening left lower back pain and left hip pain.  He denies trauma or injury.  He says the back pain interferes with his sleep and his activities.  It has been noticed that he has an antalgic gait.  He is not getting much symptom relief with over-the-counter doses of naproxen.  He denies lower extremity paresthesias.  He does not monitor his blood pressure.  He is active and denies any recent episodes of chest pain, shortness of breath, headache, or blurred vision.  He has lower extremity edema that does not bother him.  He tells me he is compliant with the ARB and beta-blocker.  Outpatient Medications Prior to Visit  Medication Sig Dispense Refill   allopurinol (ZYLOPRIM) 300 MG tablet Take 1 tablet (300 mg total) by mouth daily. 90 tablet 0   Blood Glucose Monitoring Suppl (ONE TOUCH ULTRA 2) w/Device KIT Use to check blood sugars twice a day 1 each 0   glucose blood (COOL BLOOD GLUCOSE TEST STRIPS) test strip Use as instructed to test blood sugar twice daily. DX E11.9 200 each 3   ketoconazole (NIZORAL) 2 % cream Apply topically daily. 75 g 2   ONETOUCH DELICA LANCETS 372CMISC Use to check blood sugars twice a day 300 each 3   Cholecalciferol 50 MCG (2000 UT) TABS Take 1 tablet (2,000 Units total) by mouth daily. 90 tablet 1   fenofibrate 160 MG tablet TAKE 1 TABLET BY MOUTH  DAILY 90 tablet 1   irbesartan (AVAPRO) 300 MG tablet TAKE 1 TABLET BY MOUTH  DAILY 90 tablet 1    metoprolol tartrate (LOPRESSOR) 25 MG tablet Take 1 tablet (25 mg total) by mouth 2 (two) times daily. 180 tablet 0   simvastatin (ZOCOR) 40 MG tablet Take 1 tablet (40 mg total) by mouth daily. 90 tablet 0   No facility-administered medications prior to visit.    ROS Review of Systems  Constitutional: Positive for unexpected weight change (wt gain). Negative for chills, diaphoresis and fatigue.  HENT: Negative.  Negative for trouble swallowing.   Eyes: Negative.   Respiratory: Negative for cough, chest tightness, shortness of breath and wheezing.   Cardiovascular: Negative for chest pain, palpitations and leg swelling.  Gastrointestinal: Negative for abdominal pain, blood in stool, constipation, diarrhea, nausea and vomiting.  Endocrine: Negative.   Genitourinary: Negative.  Negative for difficulty urinating.  Musculoskeletal: Positive for arthralgias, back pain and gait problem. Negative for myalgias and neck pain.  Skin: Negative for color change and pallor.  Neurological: Negative for dizziness, weakness, numbness and headaches.  Hematological: Negative for adenopathy. Does not bruise/bleed easily.  Psychiatric/Behavioral: Negative.     Objective:  BP (!) 148/96 (BP Location: Left Arm, Patient Position: Sitting, Cuff Size: Large)    Pulse 60    Temp 97.9 F (36.6 C) (Oral)    Resp 16    Ht 5' 11"  (1.803 m)    Wt (!) 324 lb (147 kg)    SpO2 98%  BMI 45.19 kg/m   BP Readings from Last 3 Encounters:  05/14/20 (!) 148/96  07/11/19 136/82  06/30/18 113/74    Wt Readings from Last 3 Encounters:  05/14/20 (!) 324 lb (147 kg)  07/11/19 (!) 303 lb 12 oz (137.8 kg)  06/30/18 270 lb (122.5 kg)    Physical Exam Vitals reviewed.  Constitutional:      Appearance: He is obese.  HENT:     Nose: Nose normal.     Mouth/Throat:     Mouth: Mucous membranes are moist.  Eyes:     General: No scleral icterus.    Conjunctiva/sclera: Conjunctivae normal.  Cardiovascular:     Rate  and Rhythm: Normal rate and regular rhythm.     Heart sounds: No murmur heard.   Pulmonary:     Effort: Pulmonary effort is normal.     Breath sounds: No stridor. No wheezing, rhonchi or rales.  Abdominal:     General: Abdomen is protuberant. Bowel sounds are normal. There is no distension.     Palpations: Abdomen is soft. There is no hepatomegaly, splenomegaly or mass.     Hernia: No hernia is present.  Musculoskeletal:     Cervical back: Normal and neck supple.     Thoracic back: Normal.     Lumbar back: Normal. No signs of trauma, tenderness or bony tenderness. Normal range of motion. Negative right straight leg raise test and negative left straight leg raise test.     Right hip: Normal.     Left hip: Tenderness present. No deformity or crepitus. Decreased range of motion. Normal strength.     Right lower leg: Edema (trace pitting) present.     Left lower leg: Edema (trace pitting) present.  Lymphadenopathy:     Cervical: No cervical adenopathy.  Neurological:     Mental Status: He is alert.     Cranial Nerves: Cranial nerves are intact.     Motor: Motor function is intact. No weakness.     Coordination: Romberg sign negative. Coordination normal.     Gait: Gait is intact. Gait normal.     Deep Tendon Reflexes: Reflexes normal.     Reflex Scores:      Tricep reflexes are 0 on the right side and 0 on the left side.      Bicep reflexes are 0 on the right side and 0 on the left side.      Brachioradialis reflexes are 0 on the right side and 0 on the left side.      Patellar reflexes are 0 on the right side and 0 on the left side.      Achilles reflexes are 0 on the right side and 0 on the left side.    Lab Results  Component Value Date   WBC 6.8 05/14/2020   HGB 13.1 05/14/2020   HCT 37.9 (L) 05/14/2020   PLT 259.0 05/14/2020   GLUCOSE 92 05/14/2020   CHOL 166 07/11/2019   TRIG 132.0 07/11/2019   HDL 40.70 07/11/2019   LDLDIRECT 69.0 05/20/2018   LDLCALC 99 07/11/2019     ALT 42 07/11/2019   AST 29 07/11/2019   NA 139 05/14/2020   K 4.0 05/14/2020   CL 106 05/14/2020   CREATININE 0.93 05/14/2020   BUN 13 05/14/2020   CO2 26 05/14/2020   TSH 2.00 07/11/2019   PSA 0.50 07/11/2019   HGBA1C 5.6 07/11/2019    DG Chest 2 View  Result Date: 02/17/2018 CLINICAL DATA:  Cough, congestion, shortness of breath and fever for 3 weeks, history hypertension EXAM: CHEST - 2 VIEW COMPARISON:  09/30/2017 FINDINGS: Normal heart size, mediastinal contours, and pulmonary vascularity. Mild chronic bronchitic changes. Lungs otherwise clear. No pleural effusion or pneumothorax. Bones unremarkable. IMPRESSION: Chronic bronchitic changes without infiltrate. Electronically Signed   By: Lavonia Dana M.D.   On: 02/17/2018 15:46   DG Lumbar Spine Complete  Result Date: 05/15/2020 CLINICAL DATA:  Low back pain for the past 8 months. EXAM: LUMBAR SPINE - COMPLETE 4+ VIEW COMPARISON:  08/22/2015 FINDINGS: There are 4 non rib-bearing lumbar type vertebral bodies with diminutive ribs seen at the presumed T12 vertebral body. For the purposes of this dictation, lumbar levels be labeled L1 through L4 in keeping with previous lumbar spine radiographs performed 07/2015. Normal alignment of the lumbar spine. No anterolisthesis or retrolisthesis. Pars defects. Lumbar vertebral body heights appear preserved. Mild to moderate multilevel lumbar spine DDD, worse at T12-L1, L1-L2 and L2-L3 with disc space height loss, endplate irregularity and sclerosis. Limited visualization of the bilateral SI joints is normal. Regional bowel gas pattern and soft tissues are normal. IMPRESSION: 1. Transitional anatomy with spinal labeling as above. 2. Mild-to-moderate multilevel lumbar spine DDD, progressed compared to the 07/2015 examination. Electronically Signed   By: Sandi Mariscal M.D.   On: 05/15/2020 08:39   DG HIP UNILAT WITH PELVIS 2-3 VIEWS LEFT  Result Date: 05/15/2020 CLINICAL DATA:  Left hip pain for the past 8  months. EXAM: DG HIP (WITH OR WITHOUT PELVIS) 2-3V LEFT COMPARISON:  None. FINDINGS: No fracture or dislocation. Moderate degenerative change of the left hip with joint space loss, subchondral sclerosis and osteophytosis. No evidence of avascular necrosis. Limited visualization of the pelvis is normal. Moderate degenerative change within the contralateral right hip is suspected though incompletely evaluated. Regional soft tissues appear normal. IMPRESSION: Moderate degenerative change of the left hip. Electronically Signed   By: Sandi Mariscal M.D.   On: 05/15/2020 08:36    Assessment & Plan:   Joangel was seen today for hypertension, back pain and osteoarthritis.  Diagnoses and all orders for this visit:  Essential hypertension- His BP is not adequately well controlled. Will add a thiazide diuretic to his current regimen. -     CBC with Differential/Platelet; Future -     Basic metabolic panel; Future -     irbesartan (AVAPRO) 300 MG tablet; Take 1 tablet (300 mg total) by mouth daily. -     metoprolol tartrate (LOPRESSOR) 25 MG tablet; Take 1 tablet (25 mg total) by mouth 2 (two) times daily. -     indapamide (LOZOL) 1.25 MG tablet; Take 1 tablet (1.25 mg total) by mouth daily. -     Basic metabolic panel -     CBC with Differential/Platelet  Vitamin D deficiency- His Vit D level is normal now. -     Cholecalciferol 50 MCG (2000 UT) TABS; Take 1 tablet (2,000 Units total) by mouth daily. -     VITAMIN D 25 Hydroxy (Vit-D Deficiency, Fractures); Future -     VITAMIN D 25 Hydroxy (Vit-D Deficiency, Fractures)  Hyperlipidemia with target LDL less than 130- He is doing well on the statin. -     fenofibrate 160 MG tablet; Take 1 tablet (160 mg total) by mouth daily. -     simvastatin (ZOCOR) 40 MG tablet; Take 1 tablet (40 mg total) by mouth daily.  PAC- He is free from palpitations. -     metoprolol  tartrate (LOPRESSOR) 25 MG tablet; Take 1 tablet (25 mg total) by mouth 2 (two) times  daily.  Chronic left hip pain- Sx's, exam, and xray are c/w OA. -     Cancel: DG Hip Unilat W OR W/O Pelvis Min 4 Views Left; Future -     etodolac (LODINE XL) 400 MG 24 hr tablet; Take 1 tablet (400 mg total) by mouth daily.  Chronic left-sided low back pain with left-sided sciatica -     DG Lumbar Spine Complete; Future -     etodolac (LODINE XL) 400 MG 24 hr tablet; Take 1 tablet (400 mg total) by mouth daily. -     tizanidine (ZANAFLEX) 2 MG capsule; Take 1 capsule (2 mg total) by mouth 3 (three) times daily as needed for muscle spasms. -     Ambulatory referral to Orthopedic Surgery  Primary osteoarthritis of left hip -     Ambulatory referral to Orthopedic Surgery   I have changed Jaeger Albert's fenofibrate and irbesartan. I am also having him start on indapamide, etodolac, and tizanidine. Additionally, I am having him maintain his OneTouch Delica Lancets 16X, ONE TOUCH ULTRA 2, glucose blood, ketoconazole, allopurinol, Cholecalciferol, metoprolol tartrate, and simvastatin.  Meds ordered this encounter  Medications   Cholecalciferol 50 MCG (2000 UT) TABS    Sig: Take 1 tablet (2,000 Units total) by mouth daily.    Dispense:  90 tablet    Refill:  1   fenofibrate 160 MG tablet    Sig: Take 1 tablet (160 mg total) by mouth daily.    Dispense:  90 tablet    Refill:  1    Requesting 1 year supply   irbesartan (AVAPRO) 300 MG tablet    Sig: Take 1 tablet (300 mg total) by mouth daily.    Dispense:  90 tablet    Refill:  1   metoprolol tartrate (LOPRESSOR) 25 MG tablet    Sig: Take 1 tablet (25 mg total) by mouth 2 (two) times daily.    Dispense:  180 tablet    Refill:  0   simvastatin (ZOCOR) 40 MG tablet    Sig: Take 1 tablet (40 mg total) by mouth daily.    Dispense:  90 tablet    Refill:  0   indapamide (LOZOL) 1.25 MG tablet    Sig: Take 1 tablet (1.25 mg total) by mouth daily.    Dispense:  90 tablet    Refill:  0   etodolac (LODINE XL) 400 MG 24 hr tablet     Sig: Take 1 tablet (400 mg total) by mouth daily.    Dispense:  90 tablet    Refill:  0   tizanidine (ZANAFLEX) 2 MG capsule    Sig: Take 1 capsule (2 mg total) by mouth 3 (three) times daily as needed for muscle spasms.    Dispense:  90 capsule    Refill:  2   I spent 50 minutes in preparing to see the patient by review of recent labs, imaging and procedures, obtaining and reviewing separately obtained history, communicating with the patient and family or caregiver, ordering medications, tests or procedures, and documenting clinical information in the EHR including the differential Dx, treatment, and any further evaluation and other management of 1. Essential hypertension 2. Vitamin D deficiency 3. Hyperlipidemia with target LDL less than 130 4. PAC 5. Chronic left hip pain 6. Chronic left-sided low back pain with left-sided sciatica 7. Primary osteoarthritis of left hip  Follow-up: Return in about 6 weeks (around 06/25/2020).  Scarlette Calico, MD

## 2020-05-14 NOTE — Patient Instructions (Signed)

## 2020-05-15 ENCOUNTER — Encounter: Payer: Self-pay | Admitting: Internal Medicine

## 2020-05-15 ENCOUNTER — Other Ambulatory Visit: Payer: Self-pay | Admitting: Internal Medicine

## 2020-05-15 DIAGNOSIS — M1A09X0 Idiopathic chronic gout, multiple sites, without tophus (tophi): Secondary | ICD-10-CM

## 2020-05-15 DIAGNOSIS — M1612 Unilateral primary osteoarthritis, left hip: Secondary | ICD-10-CM | POA: Insufficient documentation

## 2020-05-15 MED ORDER — ALLOPURINOL 300 MG PO TABS: 300.0000 mg | ORAL_TABLET | Freq: Every day | ORAL | 0 refills | Status: DC

## 2020-05-31 ENCOUNTER — Ambulatory Visit: Payer: BLUE CROSS/BLUE SHIELD | Admitting: Orthopaedic Surgery

## 2020-05-31 ENCOUNTER — Encounter: Payer: Self-pay | Admitting: Orthopaedic Surgery

## 2020-05-31 ENCOUNTER — Other Ambulatory Visit: Payer: Self-pay

## 2020-05-31 DIAGNOSIS — G8929 Other chronic pain: Secondary | ICD-10-CM | POA: Diagnosis not present

## 2020-05-31 DIAGNOSIS — M5442 Lumbago with sciatica, left side: Secondary | ICD-10-CM

## 2020-05-31 DIAGNOSIS — M25552 Pain in left hip: Secondary | ICD-10-CM

## 2020-05-31 MED ORDER — GABAPENTIN 300 MG PO CAPS
300.0000 mg | ORAL_CAPSULE | Freq: Every day | ORAL | 1 refills | Status: DC
Start: 2020-05-31 — End: 2020-08-09

## 2020-05-31 MED ORDER — HYDROCODONE-ACETAMINOPHEN 5-325 MG PO TABS: 1.0000 | ORAL_TABLET | Freq: Four times a day (QID) | ORAL | 0 refills | Status: DC | PRN

## 2020-05-31 NOTE — Progress Notes (Signed)
Office Visit Note   Patient: Rickey Hurley           Date of Birth: [birth year]           MRN: [id number] Visit Date: 05/31/2020              Requested by: Etta Grandchild, MD [address] [place],  [place] [address] PCP: Etta Grandchild, MD   Assessment & Plan: Visit Diagnoses:  1. Pain in left hip   2. Chronic left-sided low back pain with left-sided sciatica     Plan: I am going to start Neurontin 300 mg at bedtime as well as an occasional hydrocodone.  I would like to send him to Dr. Prince Rome for an ultrasound-guided intra-articular steroid injection in his left hip.  I would also like to send him for an MRI of his lumbar spine to rule out nerve impingement to the left side given his sciatica.  He agrees with this treatment plan.  Once Dr. Prince Rome sees him I would like him to reschedule an appointment with me in about 2 weeks after seeing hilts and by then we will have the MRI as well.  Follow-Up Instructions: No follow-ups on file.   Orders:  No orders of the defined types were placed in this encounter.  Meds ordered this encounter  Medications  . gabapentin (NEURONTIN) 300 MG capsule    Sig: Take 1 capsule (300 mg total) by mouth at bedtime.    Dispense:  30 capsule    Refill:  1  . HYDROcodone-acetaminophen (NORCO/VICODIN) 5-325 MG tablet    Sig: Take 1 tablet by mouth every 6 (six) hours as needed for moderate pain.    Dispense:  30 tablet    Refill:  0      Procedures: No procedures performed   Clinical Data: No additional findings.   Subjective: Chief Complaint  Patient presents with  . Lower Back - Pain  . Left Leg - Pain  Patient is a very pleasant 54 year old gentleman who comes in with a 31-month history of worsening left-sided sciatica and left hip pain.  He has tried and failed all forms recovery treatment.  He has had at least 1 injection in the hip area but it sounds like this was over the trochanteric area about 6 years ago.  There are x-rays on the  canopy system of his spine and his hip.  He points to the sciatic region and the lower back on the left side a source of his pain.  Does not go deep in the groin but it does go somewhat around the superior lateral aspect of the hip but not the trochanteric area.  He denies any numbness and tingling in his feet.  He is not a diabetic.  He has tried and failed all forms conservative treatment and this is getting slowly worse.  He denies any change in bowel bladder function.  He does appear uncomfortable to ambulate and get up.  He has been on an oral anti-inflammatories and muscle relaxants and this has not helped.  HPI  Review of Systems He currently denies any headache, chest pain, shortness of breath, fever, chills, nausea, vomiting  Objective: Vital Signs: There were no vitals taken for this visit.  Physical Exam He is alert and oriented x3 and in no acute distress Ortho Exam Examination of his entire right lower extremity is normal including muscle and sensory exam and a negative straight leg raise.  On the left side  he does seem to have a positive straight leg raise.  He has good motor function normal sensation.  He does have pain with hip flexion but it seems to be again in the sciatic region.  There is a little bit of pain with internal and external rotation but no pain over the trochanteric area.  He has pain with flexion extension of his lumbar spine when he first gets up does appear uncomfortable. Specialty Comments:  No specialty comments available.  Imaging: No results found. An AP pelvis and lateral of the left hip is reviewed.  There is only a small periarticular osteophyte around the superior lateral aspect of the acetabulum but the joint space itself is well-maintained and the femoral head is nice and round.  I would call this mild arthritis.  The radiologist called a moderate arthritis but it does not seem that way based on his clinical exam and what I am seeing on x-ray.  2 views  lumbar spine show some degenerative changes at multiple levels but no gross abnormalities and gross malalignment.  PMFS History: Patient Active Problem List   Diagnosis Date Noted  . Primary osteoarthritis of left hip 05/15/2020  . Chronic left hip pain 05/14/2020  . Chronic left-sided low back pain with left-sided sciatica 05/14/2020  . Tinnitus of both ears 07/11/2019  . Vitamin D deficiency 07/11/2019  . Pure hyperglyceridemia 05/20/2018  . Excessive daytime sleepiness 01/06/2018  . Morbid obesity due to excess calories (HCC) 06/19/2016  . Other seasonal allergic rhinitis 06/18/2016  . Prediabetes 06/18/2016  . Left lumbar radiculitis 08/22/2015  . Asthma, mild intermittent 05/02/2014  . Preventative health care 04/04/2011  . Hyperlipidemia with target LDL less than 130 12/21/2009  . Gout 12/21/2009  . Essential hypertension 12/21/2009  . [facility] 12/21/2009  . Anal fistula 12/21/2009  . COLONIC POLYPS, HX OF 12/21/2009   Past Medical History:  Diagnosis Date  . Anal fistula 12/21/2009  . CLOSTRIDIUM DIFFICILE COLITIS 04/19/2010  . COLONIC POLYPS, HX OF 12/21/2009  . CONJUNCTIVITIS, ACUTE 04/11/2010  . GOUT 12/21/2009  . H/O viral pericarditis   . HYPERLIPIDEMIA 12/21/2009  . HYPERTENSION 12/21/2009  . [facility] 12/21/2009  . SINUSITIS- ACUTE-NOS 04/11/2010    Family History  Adopted: Yes  Problem Relation Age of Onset  . Cancer Neg Hx   . Diabetes Neg Hx   . Early death Neg Hx   . Hearing loss Neg Hx   . Heart disease Neg Hx   . Hyperlipidemia Neg Hx   . Hypertension Neg Hx   . Stroke Neg Hx     Past Surgical History:  Procedure Laterality Date  . anal lateral inferior sphincterotomy from c diff    . right ankle ORIF     Social History   Occupational History  . Occupation: vice Management consultant: [employer]  Tobacco Use  . Smoking status: Never Smoker  . Smokeless tobacco: Never Used  Substance and Sexual Activity  . Alcohol use: Yes    Alcohol/week: 4.0  standard drinks    Types: 4 Shots of liquor per week  . Drug use: No  . Sexual activity: Yes    Birth control/protection: None

## 2020-06-01 ENCOUNTER — Telehealth: Payer: Self-pay

## 2020-06-01 ENCOUNTER — Other Ambulatory Visit: Payer: Self-pay

## 2020-06-01 DIAGNOSIS — M4807 Spinal stenosis, lumbosacral region: Secondary | ICD-10-CM

## 2020-06-01 NOTE — Telephone Encounter (Signed)
Patient called in saying [facility] will not accept his insurance for his mri . Wants to get his mri sent to [facility] on [address]

## 2020-06-04 NOTE — Telephone Encounter (Signed)
Noted and will be sent to [facility] imaging

## 2020-06-07 ENCOUNTER — Encounter: Payer: Self-pay | Admitting: Family Medicine

## 2020-06-07 ENCOUNTER — Other Ambulatory Visit: Payer: Self-pay

## 2020-06-07 ENCOUNTER — Ambulatory Visit: Payer: Self-pay

## 2020-06-07 ENCOUNTER — Ambulatory Visit: Payer: BLUE CROSS/BLUE SHIELD | Admitting: Family Medicine

## 2020-06-07 DIAGNOSIS — M47817 Spondylosis without myelopathy or radiculopathy, lumbosacral region: Secondary | ICD-10-CM | POA: Diagnosis not present

## 2020-06-07 DIAGNOSIS — M25552 Pain in left hip: Secondary | ICD-10-CM

## 2020-06-07 DIAGNOSIS — M47816 Spondylosis without myelopathy or radiculopathy, lumbar region: Secondary | ICD-10-CM | POA: Diagnosis not present

## 2020-06-07 DIAGNOSIS — M5136 Other intervertebral disc degeneration, lumbar region: Secondary | ICD-10-CM | POA: Diagnosis not present

## 2020-06-07 NOTE — Progress Notes (Signed)
Subjective: Patient is here for ultrasound-guided intra-articular left hip injection.   This will be a diagnostic and hopefully therapeutic injection.  He has chronic pain in the posterior lateral hip with occasional pain in the groin area.  He had an MRI of the lumbar spine this morning but the report is not available.  I reviewed his images briefly on CD and did not see any obvious disc herniation.  Objective: He has tenderness in the sciatic notch, and over the greater trochanter.  He also has quite a bit of pain when I passively flex and internally rotate his hip.  Procedure: Ultrasound-guided left hip injection: After sterile prep with Betadine, injected 8 cc 1% lidocaine without epinephrine and 40 mg methylprednisolone using a 22-gauge spinal needle, passing the needle through the iliofemoral ligament into the femoral head/neck junction.  Injectate was seen filling the joint capsule.  He had slight change in symptoms.  He will keep note of how he feels and report back to [place].

## 2020-07-02 ENCOUNTER — Other Ambulatory Visit: Payer: BLUE CROSS/BLUE SHIELD

## 2020-08-03 ENCOUNTER — Other Ambulatory Visit: Payer: Self-pay | Admitting: Internal Medicine

## 2020-08-03 DIAGNOSIS — G8929 Other chronic pain: Secondary | ICD-10-CM

## 2020-08-03 DIAGNOSIS — E785 Hyperlipidemia, unspecified: Secondary | ICD-10-CM

## 2020-08-03 DIAGNOSIS — M5442 Lumbago with sciatica, left side: Secondary | ICD-10-CM

## 2020-08-03 DIAGNOSIS — I10 Essential (primary) hypertension: Secondary | ICD-10-CM

## 2020-08-03 DIAGNOSIS — I491 Atrial premature depolarization: Secondary | ICD-10-CM

## 2020-08-03 MED ORDER — ETODOLAC ER 400 MG PO TB24: 400.0000 mg | ORAL_TABLET | Freq: Every day | ORAL | 0 refills | Status: DC

## 2020-08-03 MED ORDER — SIMVASTATIN 40 MG PO TABS: 40.0000 mg | ORAL_TABLET | Freq: Every day | ORAL | 0 refills | Status: DC

## 2020-08-03 MED ORDER — INDAPAMIDE 1.25 MG PO TABS: 1.2500 mg | ORAL_TABLET | Freq: Every day | ORAL | 0 refills | Status: DC

## 2020-08-03 MED ORDER — METOPROLOL TARTRATE 25 MG PO TABS: 25.0000 mg | ORAL_TABLET | Freq: Two times a day (BID) | ORAL | 0 refills | Status: DC

## 2020-08-06 ENCOUNTER — Encounter: Payer: Self-pay | Admitting: Internal Medicine

## 2020-08-06 ENCOUNTER — Other Ambulatory Visit: Payer: Self-pay | Admitting: Internal Medicine

## 2020-08-06 DIAGNOSIS — H00019 Hordeolum externum unspecified eye, unspecified eyelid: Secondary | ICD-10-CM

## 2020-08-06 MED ORDER — SULFAMETHOXAZOLE-TRIMETHOPRIM 800-160 MG PO TABS: 1.0000 | ORAL_TABLET | Freq: Two times a day (BID) | ORAL | 0 refills | Status: AC

## 2020-08-09 ENCOUNTER — Other Ambulatory Visit: Payer: Self-pay | Admitting: Orthopaedic Surgery

## 2020-08-09 NOTE — Telephone Encounter (Signed)
Ok to refill 

## 2020-08-10 ENCOUNTER — Other Ambulatory Visit: Payer: Self-pay | Admitting: Internal Medicine

## 2020-08-10 DIAGNOSIS — M1A09X0 Idiopathic chronic gout, multiple sites, without tophus (tophi): Secondary | ICD-10-CM

## 2020-09-05 ENCOUNTER — Other Ambulatory Visit: Payer: Self-pay | Admitting: Internal Medicine

## 2020-09-05 ENCOUNTER — Encounter: Payer: Self-pay | Admitting: Internal Medicine

## 2020-09-08 ENCOUNTER — Ambulatory Visit: Payer: BLUE CROSS/BLUE SHIELD | Attending: Internal Medicine

## 2020-09-08 DIAGNOSIS — Z23 Encounter for immunization: Secondary | ICD-10-CM

## 2020-09-08 NOTE — Progress Notes (Signed)
° °  Covid-19 Vaccination Clinic  Name:  Rickey Hurley    MRN: [id number] DOB: 1966/09/05  09/08/2020  [name] was observed post Covid-19 immunization for 15 minutes without incident. He was provided with Vaccine Information Sheet and instruction to access the V-Safe system.   [name] was instructed to call 911 with any severe reactions post vaccine:  Difficulty breathing   Swelling of face and throat   A fast heartbeat   A bad rash all over body   Dizziness and weakness

## 2020-09-26 ENCOUNTER — Other Ambulatory Visit: Payer: Self-pay

## 2020-09-26 ENCOUNTER — Encounter: Payer: Self-pay | Admitting: Internal Medicine

## 2020-09-26 ENCOUNTER — Ambulatory Visit (INDEPENDENT_AMBULATORY_CARE_PROVIDER_SITE_OTHER): Payer: BLUE CROSS/BLUE SHIELD

## 2020-09-26 ENCOUNTER — Ambulatory Visit: Payer: BLUE CROSS/BLUE SHIELD | Admitting: Internal Medicine

## 2020-09-26 VITALS — BP 142/86 | HR 61 | Temp 98.3°F | Ht 71.0 in | Wt 312.0 lb

## 2020-09-26 DIAGNOSIS — G8929 Other chronic pain: Secondary | ICD-10-CM

## 2020-09-26 DIAGNOSIS — M79671 Pain in right foot: Secondary | ICD-10-CM

## 2020-09-26 DIAGNOSIS — M25512 Pain in left shoulder: Secondary | ICD-10-CM | POA: Diagnosis not present

## 2020-09-26 DIAGNOSIS — M25571 Pain in right ankle and joints of right foot: Secondary | ICD-10-CM

## 2020-09-26 DIAGNOSIS — Z23 Encounter for immunization: Secondary | ICD-10-CM | POA: Diagnosis not present

## 2020-09-26 DIAGNOSIS — M7731 Calcaneal spur, right foot: Secondary | ICD-10-CM | POA: Diagnosis not present

## 2020-09-26 DIAGNOSIS — M19071 Primary osteoarthritis, right ankle and foot: Secondary | ICD-10-CM | POA: Diagnosis not present

## 2020-09-26 DIAGNOSIS — M19171 Post-traumatic osteoarthritis, right ankle and foot: Secondary | ICD-10-CM

## 2020-09-26 NOTE — Progress Notes (Signed)
Subjective:  Patient ID: Rickey Hurley, male    DOB: [birth year]  Age: 54 y.o. MRN: [id number]  CC: Osteoarthritis  This visit occurred during the SARS-CoV-2 public health emergency.  Safety protocols were in place, including screening questions prior to the visit, additional usage of staff PPE, and extensive cleaning of exam room while observing appropriate contact time as indicated for disinfecting solutions.    HPI Rickey Hurley presents for f/up - He complains of a 6-week history of worsening musculoskeletal pain.  He denies any recent trauma or injury.  He has developed pain and decreased range of motion in the left shoulder.  He also complains of chronic worsening pain in his right ankle and foot.  He had right ankle surgery many years ago.  He is getting minimal symptom relief with naproxen.  He is not able to exercise because of the pain.  When he is active he does not experience chest pain, shortness of breath, palpitations, edema, or fatigue.  Outpatient Medications Prior to Visit  Medication Sig Dispense Refill  . allopurinol (ZYLOPRIM) 300 MG tablet TAKE ONE TABLET BY MOUTH DAILY 90 tablet 0  . Cholecalciferol 50 MCG (2000 UT) TABS Take 1 tablet (2,000 Units total) by mouth daily. 90 tablet 1  . fenofibrate 160 MG tablet Take 1 tablet (160 mg total) by mouth daily. 90 tablet 1  . indapamide (LOZOL) 1.25 MG tablet Take 1 tablet (1.25 mg total) by mouth daily. 90 tablet 0  . irbesartan (AVAPRO) 300 MG tablet Take 1 tablet (300 mg total) by mouth daily. 90 tablet 1  . metoprolol tartrate (LOPRESSOR) 25 MG tablet Take 1 tablet (25 mg total) by mouth 2 (two) times daily. 180 tablet 0  . ONETOUCH DELICA LANCETS 94T MISC Use to check blood sugars twice a day 300 each 3  . simvastatin (ZOCOR) 40 MG tablet Take 1 tablet (40 mg total) by mouth daily. 90 tablet 0  . tizanidine (ZANAFLEX) 2 MG capsule Take 1 capsule (2 mg total) by mouth 3 (three) times daily as needed for muscle spasms. 90 capsule 2   . Blood Glucose Monitoring Suppl (ONE TOUCH ULTRA 2) w/Device KIT Use to check blood sugars twice a day 1 each 0  . etodolac (LODINE XL) 400 MG 24 hr tablet Take 1 tablet (400 mg total) by mouth daily. 90 tablet 0  . gabapentin (NEURONTIN) 300 MG capsule TAKE ONE CAPSULE BY MOUTH AT BEDTIME 30 capsule 1  . glucose blood (COOL BLOOD GLUCOSE TEST STRIPS) test strip Use as instructed to test blood sugar twice daily. DX E11.9 200 each 3  . HYDROcodone-acetaminophen (NORCO/VICODIN) 5-325 MG tablet Take 1 tablet by mouth every 6 (six) hours as needed for moderate pain. 30 tablet 0   No facility-administered medications prior to visit.    ROS Review of Systems  Constitutional: Negative for diaphoresis, fatigue and unexpected weight change.  HENT: Negative.   Eyes: Negative.   Respiratory: Negative for cough, chest tightness, wheezing and stridor.   Cardiovascular: Negative for chest pain, palpitations and leg swelling.  Gastrointestinal: Negative for abdominal pain, constipation, diarrhea, nausea and vomiting.  Endocrine: Negative.   Genitourinary: Negative.  Negative for difficulty urinating.  Musculoskeletal: Positive for arthralgias. Negative for back pain, myalgias and neck pain.  Skin: Negative.  Negative for color change, pallor and rash.  Neurological: Negative.  Negative for dizziness, weakness, light-headedness and numbness.  Hematological: Negative for adenopathy. Does not bruise/bleed easily.  Psychiatric/Behavioral: Negative.     Objective:  BP (!) 142/86   Pulse 61   Temp 98.3 F (36.8 C) (Oral)   Ht 5' 11"  (1.803 m)   Wt (!) 312 lb (141.5 kg)   SpO2 98%   BMI 43.52 kg/m   BP Readings from Last 3 Encounters:  09/26/20 (!) 142/86  05/14/20 (!) 148/96  07/11/19 136/82    Wt Readings from Last 3 Encounters:  09/26/20 (!) 312 lb (141.5 kg)  05/14/20 (!) 324 lb (147 kg)  07/11/19 (!) 303 lb 12 oz (137.8 kg)    Physical Exam Vitals reviewed.  HENT:     Nose: Nose  normal.     Mouth/Throat:     Mouth: Mucous membranes are moist.  Eyes:     General: No scleral icterus.    Conjunctiva/sclera: Conjunctivae normal.  Cardiovascular:     Rate and Rhythm: Normal rate and regular rhythm.     Heart sounds: No murmur heard.   Pulmonary:     Effort: Pulmonary effort is normal.     Breath sounds: No stridor. No wheezing, rhonchi or rales.  Abdominal:     General: Abdomen is protuberant. Bowel sounds are normal. There is no distension.     Palpations: Abdomen is soft. There is no hepatomegaly, splenomegaly or mass.     Tenderness: There is no abdominal tenderness.  Musculoskeletal:     Right shoulder: Normal.     Left shoulder: No swelling, deformity, tenderness or bony tenderness. Decreased range of motion.     Cervical back: Neck supple.     Right lower leg: No edema.     Left lower leg: No edema.     Right ankle: Deformity present. No swelling. Tenderness present. Decreased range of motion.     Left ankle: Normal.     Comments: ++surgical scars over the right ankle  Lymphadenopathy:     Cervical: No cervical adenopathy.  Skin:    General: Skin is warm and dry.     Coloration: Skin is not pale.  Neurological:     General: No focal deficit present.     Mental Status: He is alert.  Psychiatric:        Mood and Affect: Mood normal.     Lab Results  Component Value Date   WBC 6.8 05/14/2020   HGB 13.1 05/14/2020   HCT 37.9 (L) 05/14/2020   PLT 259.0 05/14/2020   GLUCOSE 92 05/14/2020   CHOL 166 07/11/2019   TRIG 132.0 07/11/2019   HDL 40.70 07/11/2019   LDLDIRECT 69.0 05/20/2018   LDLCALC 99 07/11/2019   ALT 42 07/11/2019   AST 29 07/11/2019   NA 139 05/14/2020   K 4.0 05/14/2020   CL 106 05/14/2020   CREATININE 0.93 05/14/2020   BUN 13 05/14/2020   CO2 26 05/14/2020   TSH 2.00 07/11/2019   PSA 0.50 07/11/2019   HGBA1C 5.6 07/11/2019    DG Chest 2 View  Result Date: 02/17/2018 CLINICAL DATA:  Cough, congestion, shortness of  breath and fever for 3 weeks, history hypertension EXAM: CHEST - 2 VIEW COMPARISON:  09/30/2017 FINDINGS: Normal heart size, mediastinal contours, and pulmonary vascularity. Mild chronic bronchitic changes. Lungs otherwise clear. No pleural effusion or pneumothorax. Bones unremarkable. IMPRESSION: Chronic bronchitic changes without infiltrate. Electronically Signed   By: Lavonia Dana M.D.   On: 02/17/2018 15:46   EXAM: RIGHT ANKLE - COMPLETE 3+ VIEW  COMPARISON:  None.  FINDINGS: Moderate talotibial spur formation. Moderate posterior and calcaneal enthesophyte formation. 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 Moderate dorsal tarsal spur formation.  IMPRESSION: 1. Moderate talotibial and dorsal tarsal degenerative changes. 2. Ossification of the distal interosseous membrane with fusion of the distal tibia and fibula. 3. Moderate calcaneal enthesophyte formation.   Electronically Signed   By: Claudie Revering M.D.   On: 09/28/2020 15:20  Assessment & Plan:   Rondey was seen today for osteoarthritis.  Diagnoses and all orders for this visit:  Chronic left shoulder pain- Plain films are normal.  I think he has a rotator cuff disorder.  He will be seeing orthopedics about this. -     DG Shoulder Left; Future  Chronic foot pain, right -     DG Foot Complete Right; Future  Chronic pain of right ankle -     DG Ankle Complete Right; Future  Post-traumatic osteoarthritis of right ankle- There is spurring and DJD on plain.  He has worsening pain so we will add tramadol to the NSAID.  I have asked him to see orthopedics to see if he would benefit from ankle replacement surgery. -     Ambulatory referral to Orthopedic Surgery  Other orders -     Flu Vaccine QUAD 6+ mos PF IM (Fluarix Quad PF) -     Tdap vaccine greater than or equal to 7yo IM -     Pneumococcal polysaccharide vaccine 23-valent greater than or equal to  2yo subcutaneous/IM   I have discontinued Semisi Tupy's ONE TOUCH ULTRA 2, glucose blood, HYDROcodone-acetaminophen, etodolac, and gabapentin. I am also having him start on traMADol. Additionally, I am having him maintain his OneTouch Delica Lancets 94W, Cholecalciferol, fenofibrate, irbesartan, tizanidine, metoprolol tartrate, simvastatin, indapamide, and allopurinol.  Meds ordered this encounter  Medications  . traMADol (ULTRAM) 50 MG tablet    Sig: Take 1 tablet (50 mg total) by mouth every 6 (six) hours as needed for up to 7 days.    Dispense:  35 tablet    Refill:  3     Follow-up: Return if symptoms worsen or fail to improve.  Scarlette Calico, MD

## 2020-09-26 NOTE — Patient Instructions (Signed)

## 2020-09-27 ENCOUNTER — Ambulatory Visit: Payer: BLUE CROSS/BLUE SHIELD | Admitting: Internal Medicine

## 2020-09-28 ENCOUNTER — Encounter: Payer: Self-pay | Admitting: Internal Medicine

## 2020-10-01 DIAGNOSIS — M19171 Post-traumatic osteoarthritis, right ankle and foot: Secondary | ICD-10-CM | POA: Insufficient documentation

## 2020-10-01 MED ORDER — TRAMADOL HCL 50 MG PO TABS: 50.0000 mg | ORAL_TABLET | Freq: Four times a day (QID) | ORAL | 3 refills | Status: AC | PRN

## 2020-10-04 ENCOUNTER — Ambulatory Visit: Payer: BLUE CROSS/BLUE SHIELD | Admitting: Orthopedic Surgery

## 2020-10-04 ENCOUNTER — Encounter: Payer: Self-pay | Admitting: Orthopedic Surgery

## 2020-10-04 VITALS — Ht 71.0 in | Wt 312.0 lb

## 2020-10-04 DIAGNOSIS — M19171 Post-traumatic osteoarthritis, right ankle and foot: Secondary | ICD-10-CM | POA: Diagnosis not present

## 2020-10-04 MED ORDER — METHYLPREDNISOLONE ACETATE 40 MG/ML IJ SUSP
40.0000 mg | INTRAMUSCULAR | Status: AC | PRN
  Administered 2020-10-04: 40 mg via INTRA_ARTICULAR

## 2020-10-04 MED ORDER — LIDOCAINE HCL 1 % IJ SOLN
2.0000 mL | INTRAMUSCULAR | Status: AC | PRN
  Administered 2020-10-04: 2 mL

## 2020-10-04 NOTE — Progress Notes (Signed)
Office Visit Note   Patient: Rickey Hurley           Date of Birth: 1966-06-04           MRN: [id number] Visit Date: 10/04/2020              Requested by: Etta Grandchild, MD [address] [place],  [place] [address] PCP: Etta Grandchild, MD  Chief Complaint  Patient presents with  . Right Ankle - Pain      HPI: Patient is a 54 year old gentleman who presents for initial evaluation for posttraumatic osteoarthritis right ankle.  Patient states that the injured his ankle about 32 years ago had open reduction internal fixation with subsequent hardware removal.  Patient states he has been having increasing pain over the past several months.  He states he had a bone spur over the dorsal lateral aspect of the talus that was removed about 20 years ago.  Assessment & Plan: Visit Diagnoses:  1. Post-traumatic osteoarthritis, right ankle and foot     Plan: Patient's ankle was injected he tolerated this well follow-up in 3 weeks.  Anticipate patient may benefit from arthroscopic debridement.  Follow-Up Instructions: Return in about 3 weeks (around 10/25/2020).   Ortho Exam  Patient is alert, oriented, no adenopathy, well-dressed, normal affect, normal respiratory effort. Examination patient has a good dorsalis pedis pulse he has good subtalar motion.  Lateral radiograph shows large osteophytic bone spurs anteriorly over the tibia and talus with large bony spurs over the talar head.  AP and oblique radiographs shows large bony spurs inferior medial to the medial malleolus.  There is calcification of the syndesmosis the joint space is congruent all hardware has been removed.  Patient's right ankle was injected it was difficult to inject 3 cc into the joint indicating significant joint inflammation with scar tissue.  Imaging: No results found. No images are attached to the encounter.  Labs: Lab Results  Component Value Date   HGBA1C 5.6 07/11/2019   HGBA1C 5.7 05/07/2017   HGBA1C 6.2  06/18/2016   ESRSEDRATE 8 04/19/2010   LABURIC 4.9 07/11/2019   LABURIC 5.1 05/07/2017   LABURIC 5.3 06/18/2016   LABORGA NO GROWTH 04/14/2017     Lab Results  Component Value Date   ALBUMIN 4.3 07/11/2019   ALBUMIN 4.3 01/06/2018   ALBUMIN 3.9 07/14/2017   LABURIC 4.9 07/11/2019   LABURIC 5.1 05/07/2017   LABURIC 5.3 06/18/2016    No results found for: MG Lab Results  Component Value Date   VD25OH 34.87 05/14/2020   VD25OH 24.35 (L) 07/11/2019    No results found for: PREALBUMIN CBC EXTENDED Latest Ref Rng & Units 05/14/2020 07/11/2019 01/06/2018  WBC 4.0 - 10.5 K/uL 6.8 6.7 6.6  RBC 4.22 - 5.81 Mil/uL 4.28 4.23 4.27  HGB 13.0 - 17.0 g/dL 62.1 30.8 65.7  HCT 39 - 52 % 37.9(L) 38.3(L) 38.1(L)  PLT 150 - 400 K/uL 259.0 295.0 269.0  NEUTROABS 1.4 - 7.7 K/uL 3.9 4.1 3.3  LYMPHSABS 0.7 - 4.0 K/uL 2.1 1.8 2.3     Body mass index is 43.52 kg/m.  Orders:  No orders of the defined types were placed in this encounter.  No orders of the defined types were placed in this encounter.    Procedures: Medium Joint Inj: R ankle on 10/04/2020 10:09 AM Indications: pain and diagnostic evaluation Details: 22 G 1.5 in needle, anteromedial approach Medications: 2 mL lidocaine 1 %; 40 mg methylPREDNISolone acetate 40 MG/ML  Outcome: tolerated well, no immediate complications Procedure, treatment alternatives, risks and benefits explained, specific risks discussed. Consent was given by the patient. Immediately prior to procedure a time out was called to verify the correct patient, procedure, equipment, support staff and site/side marked as required. Patient was prepped and draped in the usual sterile fashion.      Clinical Data: No additional findings.  ROS:  All other systems negative, except as noted in the HPI. Review of Systems  Objective: Vital Signs: Ht 5\' 11"  (1.803 m)   Wt (!) 312 lb (141.5 kg)   BMI 43.52 kg/m   Specialty Comments:  No specialty comments  available.  PMFS History: Patient Active Problem List   Diagnosis Date Noted  . Post-traumatic osteoarthritis of right ankle 10/01/2020  . Chronic left shoulder pain 09/26/2020  . Chronic foot pain, right 09/26/2020  . Chronic pain of right ankle 09/26/2020  . Primary osteoarthritis of left hip 05/15/2020  . Chronic left hip pain 05/14/2020  . Chronic left-sided low back pain with left-sided sciatica 05/14/2020  . Tinnitus of both ears 07/11/2019  . Vitamin D deficiency 07/11/2019  . Pure hyperglyceridemia 05/20/2018  . Excessive daytime sleepiness 01/06/2018  . Morbid obesity due to excess calories (HCC) 06/19/2016  . Other seasonal allergic rhinitis 06/18/2016  . Prediabetes 06/18/2016  . Left lumbar radiculitis 08/22/2015  . Asthma, mild intermittent 05/02/2014  . Preventative health care 04/04/2011  . Hyperlipidemia with target LDL less than 130 12/21/2009  . Gout 12/21/2009  . Essential hypertension 12/21/2009  . [facility] 12/21/2009  . Anal fistula 12/21/2009  . COLONIC POLYPS, HX OF 12/21/2009   Past Medical History:  Diagnosis Date  . Anal fistula 12/21/2009  . CLOSTRIDIUM DIFFICILE COLITIS 04/19/2010  . COLONIC POLYPS, HX OF 12/21/2009  . CONJUNCTIVITIS, ACUTE 04/11/2010  . GOUT 12/21/2009  . H/O viral pericarditis   . HYPERLIPIDEMIA 12/21/2009  . HYPERTENSION 12/21/2009  . [facility] 12/21/2009  . SINUSITIS- ACUTE-NOS 04/11/2010    Family History  Adopted: Yes  Problem Relation Age of Onset  . Cancer Neg Hx   . Diabetes Neg Hx   . Early death Neg Hx   . Hearing loss Neg Hx   . Heart disease Neg Hx   . Hyperlipidemia Neg Hx   . Hypertension Neg Hx   . Stroke Neg Hx     Past Surgical History:  Procedure Laterality Date  . anal lateral inferior sphincterotomy from c diff    . right ankle ORIF     Social History   Occupational History  . 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 Smoking status: Never Smoker  . Smokeless tobacco: Never  Used  Substance and Sexual Activity  . Alcohol use: Yes    Alcohol/week: 4.0 standard drinks    Types: 4 Shots of liquor per week  . Drug use: No  . Sexual activity: Yes    Birth control/protection: None

## 2020-10-24 ENCOUNTER — Other Ambulatory Visit: Payer: Self-pay | Admitting: Internal Medicine

## 2020-10-24 DIAGNOSIS — I491 Atrial premature depolarization: Secondary | ICD-10-CM

## 2020-10-24 DIAGNOSIS — M1A09X0 Idiopathic chronic gout, multiple sites, without tophus (tophi): Secondary | ICD-10-CM

## 2020-10-24 DIAGNOSIS — E785 Hyperlipidemia, unspecified: Secondary | ICD-10-CM

## 2020-10-24 DIAGNOSIS — I10 Essential (primary) hypertension: Secondary | ICD-10-CM

## 2020-10-25 ENCOUNTER — Telehealth: Payer: Self-pay

## 2020-10-25 ENCOUNTER — Ambulatory Visit: Payer: BLUE CROSS/BLUE SHIELD | Admitting: Orthopedic Surgery

## 2020-10-25 ENCOUNTER — Encounter: Payer: Self-pay | Admitting: Orthopedic Surgery

## 2020-10-25 DIAGNOSIS — M19171 Post-traumatic osteoarthritis, right ankle and foot: Secondary | ICD-10-CM

## 2020-10-25 DIAGNOSIS — S82891A Other fracture of right lower leg, initial encounter for closed fracture: Secondary | ICD-10-CM

## 2020-10-25 DIAGNOSIS — S82899A Other fracture of unspecified lower leg, initial encounter for closed fracture: Secondary | ICD-10-CM

## 2020-10-25 NOTE — Progress Notes (Signed)
Office Visit Note   Patient: Rickey Hurley           Date of Birth: Feb 19, 1966           MRN: [id number] Visit Date: 10/25/2020              Requested by: Etta Grandchild, MD [address] [place],  [place] [address] PCP: Etta Grandchild, MD  Chief Complaint  Patient presents with   Right Ankle - Follow-up      HPI: Patient is a 54 year old gentleman with traumatic arthritis right ankle he is status post open reduction internal fixation with hardware removal and has had 2 small bone spurs removed with 2 separate incisions on the dorsum of his foot.  Patient states the ankle injection helped a lot but he still has crepitation with range of motion and complains of pain over the anterior lateral aspect of the talar neck pain with attempted dorsiflexion.  Assessment & Plan: Visit Diagnoses:  1. Post-traumatic osteoarthritis, right ankle and foot   2. Other fracture of unspecified lower leg, initial encounter for closed fracture     Plan: Patient's pain seems to be primarily from intra-articular traumatic arthritic changes and complete lack of motion of the tibia and fibula due to the calcification of the syndesmosis.  We will request a CT scan to further evaluate the bony anatomy patient may benefit from arthroscopic debridement possible bone spur excisions may ultimately require a fusion.  Recommend that he not use his Ultram and try Voltaren gel over the ankle 3-4 times a day.  Follow-Up Instructions: Return if symptoms worsen or fail to improve, for Follow-up after CT scan right ankle.Gaylord Shih Exam  Patient is alert, oriented, no adenopathy, well-dressed, normal affect, normal respiratory effort. Examination patient has a good pulse he has 2 small incisions dorsally over the midfoot a well-healed surgical incision over the fibula.  Patient has good subtalar motion with about 20 degrees of inversion and eversion and this is not painful.  Patient does have bony spurs over the talus  at the talonavicular joint but palpation at this site is not tender.  He is tender to palpation over the proximal lateral aspect of the talar neck and tender over the joint line.  He has dorsiflexion 20 degrees short of neutral with plantar flexion of only 40 degrees with only 20 degrees of range of motion.  Attempted dorsiflexion is painful over the lateral joint line.  Imaging: No results found. No images are attached to the encounter.  Labs: Lab Results  Component Value Date   HGBA1C 5.6 07/11/2019   HGBA1C 5.7 05/07/2017   HGBA1C 6.2 06/18/2016   ESRSEDRATE 8 04/19/2010   LABURIC 4.9 07/11/2019   LABURIC 5.1 05/07/2017   LABURIC 5.3 06/18/2016   LABORGA NO GROWTH 04/14/2017     Lab Results  Component Value Date   ALBUMIN 4.3 07/11/2019   ALBUMIN 4.3 01/06/2018   ALBUMIN 3.9 07/14/2017   LABURIC 4.9 07/11/2019   LABURIC 5.1 05/07/2017   LABURIC 5.3 06/18/2016    No results found for: MG Lab Results  Component Value Date   VD25OH 34.87 05/14/2020   VD25OH 24.35 (L) 07/11/2019    No results found for: PREALBUMIN CBC EXTENDED Latest Ref Rng & Units 05/14/2020 07/11/2019 01/06/2018  WBC 4.0 - 10.5 K/uL 6.8 6.7 6.6  RBC 4.22 - 5.81 Mil/uL 4.28 4.23 4.27  HGB 13.0 - 17.0 g/dL 62.3 76.2 83.1  HCT 39 - 52 %  37.9(L) 38.3(L) 38.1(L)  PLT 150 - 400 K/uL 259.0 295.0 269.0  NEUTROABS 1.4 - 7.7 K/uL 3.9 4.1 3.3  LYMPHSABS 0.7 - 4.0 K/uL 2.1 1.8 2.3     There is no height or weight on file to calculate BMI.  Orders:  Orders Placed This Encounter  Procedures   CT ANKLE RIGHT WO CONTRAST   No orders of the defined types were placed in this encounter.    Procedures: No procedures performed  Clinical Data: No additional findings.  ROS:  All other systems negative, except as noted in the HPI. Review of Systems  Objective: Vital Signs: There were no vitals taken for this visit.  Specialty Comments:  No specialty comments available.  PMFS History: Patient  Active Problem List   Diagnosis Date Noted   Post-traumatic osteoarthritis of right ankle 10/01/2020   Chronic left shoulder pain 09/26/2020   Chronic foot pain, right 09/26/2020   Chronic pain of right ankle 09/26/2020   Primary osteoarthritis of left hip 05/15/2020   Chronic left hip pain 05/14/2020   Chronic left-sided low back pain with left-sided sciatica 05/14/2020   Tinnitus of both ears 07/11/2019   Vitamin D deficiency 07/11/2019   Pure hyperglyceridemia 05/20/2018   Excessive daytime sleepiness 01/06/2018   Morbid obesity due to excess calories (HCC) 06/19/2016   Other seasonal allergic rhinitis 06/18/2016   Prediabetes 06/18/2016   Left lumbar radiculitis 08/22/2015   Asthma, mild intermittent 05/02/2014   Preventative health care 04/04/2011   Hyperlipidemia with target LDL less than 130 12/21/2009   Gout 12/21/2009   Essential hypertension 12/21/2009   PAC 12/21/2009   Anal fistula 12/21/2009   COLONIC POLYPS, HX OF 12/21/2009   Past Medical History:  Diagnosis Date   Anal fistula 12/21/2009   CLOSTRIDIUM DIFFICILE COLITIS 04/19/2010   COLONIC POLYPS, HX OF 12/21/2009   CONJUNCTIVITIS, ACUTE 04/11/2010   GOUT 12/21/2009   H/O viral pericarditis    HYPERLIPIDEMIA 12/21/2009   HYPERTENSION 12/21/2009   PAC 12/21/2009   SINUSITIS- ACUTE-NOS 04/11/2010    Family History  Adopted: Yes  Problem Relation Age of Onset   Cancer Neg Hx    Diabetes Neg Hx    Early death Neg Hx    Hearing loss Neg Hx    Heart disease Neg Hx    Hyperlipidemia Neg Hx    Hypertension Neg Hx    Stroke Neg Hx     Past Surgical History:  Procedure Laterality Date   anal lateral inferior sphincterotomy from c diff     right ankle ORIF     Social History   Occupational History   Occupation: vice Management consultant: [employer]  Tobacco Use   Smoking status: Never Smoker   Smokeless tobacco: Never Used  Substance and Sexual Activity     Alcohol use: Yes    Alcohol/week: 4.0 standard drinks    Types: 4 Shots of liquor per week   Drug use: No   Sexual activity: Yes    Birth control/protection: None

## 2020-10-25 NOTE — Telephone Encounter (Signed)
Patient called he is requesting a order for ct scan and insurance authorization faxed to [facility]. Fax: [phone] CB:[phone]

## 2020-10-26 NOTE — Telephone Encounter (Signed)
This pt had an order for CT right ankle yesterday please see beelow and let me know if there is anything that I need to do. Thanks

## 2020-10-30 NOTE — Telephone Encounter (Signed)
Order faxed to [facility] imaging and pt aware order was sent and they shouuld be calling to schedule

## 2020-11-01 DIAGNOSIS — Z981 Arthrodesis status: Secondary | ICD-10-CM | POA: Diagnosis not present

## 2020-11-01 DIAGNOSIS — M19071 Primary osteoarthritis, right ankle and foot: Secondary | ICD-10-CM | POA: Diagnosis not present

## 2020-11-01 DIAGNOSIS — M76891 Other specified enthesopathies of right lower limb, excluding foot: Secondary | ICD-10-CM | POA: Diagnosis not present

## 2020-11-13 ENCOUNTER — Encounter: Payer: Self-pay | Admitting: Orthopedic Surgery

## 2020-11-13 ENCOUNTER — Ambulatory Visit: Payer: BLUE CROSS/BLUE SHIELD | Admitting: Orthopedic Surgery

## 2020-11-13 VITALS — Ht 71.0 in | Wt 312.0 lb

## 2020-11-13 DIAGNOSIS — M19171 Post-traumatic osteoarthritis, right ankle and foot: Secondary | ICD-10-CM | POA: Diagnosis not present

## 2020-11-13 NOTE — Progress Notes (Signed)
Office Visit Note   Patient: Rickey Hurley           Date of Birth: Mar 19, 1966           MRN: [id number] Visit Date: 11/13/2020              Requested by: Etta Grandchild, MD [address] [place],  [place] [address] PCP: Etta Grandchild, MD  Chief Complaint  Patient presents with  . Right Ankle - Follow-up    CT Scan review       HPI: Patient is a 54 year old gentleman who presents for follow-up status post CT scan of his right ankle.  Patient still has pain through the midfoot pain over bone spur dorsal lateral aspect of the right foot as well as pain over the anterior aspect of the ankle joint.  Patient has had some temporary relief with Voltaren gel.  Assessment & Plan: Visit Diagnoses:  1. Post-traumatic osteoarthritis, right ankle and foot     Plan: Due to the persistent pain difficulty with activities of daily living patient states he would like to proceed with arthroscopic debridement of the right ankle and excision of the bony spur over the dorsal lateral aspect of the right foot.  Risks and benefits were discussed including persistent pain risk of infection potential for additional surgery.  Patient states she understands wished to proceed at this time.  Patient does have sleep apnea and uses a CPAP machine most likely will proceed with surgery at [facility] Main OR.  Follow-Up Instructions: No follow-ups on file.   Ortho Exam  Patient is alert, oriented, no adenopathy, well-dressed, normal affect, normal respiratory effort. Examination patient has good range of motion the ankle and subtalar joint.  He has a palpable pulse there is a prominent bony spur over the dorsal lateral aspect of the talus which is painful and pain with shoe wear.  Patient has pain to palpation of the anterior aspect of the ankle.  Radiograph shows osteophytic bone spurs at the tibial talar joint as well as osteophytic bone spurs dorsally over the talar head.  Review of the CT scan shows no cystic  changes no lytic bony changes no evidence of any stress fractures.  Imaging: No results found. No images are attached to the encounter.  Labs: Lab Results  Component Value Date   HGBA1C 5.6 07/11/2019   HGBA1C 5.7 05/07/2017   HGBA1C 6.2 06/18/2016   ESRSEDRATE 8 04/19/2010   LABURIC 4.9 07/11/2019   LABURIC 5.1 05/07/2017   LABURIC 5.3 06/18/2016   LABORGA NO GROWTH 04/14/2017     Lab Results  Component Value Date   ALBUMIN 4.3 07/11/2019   ALBUMIN 4.3 01/06/2018   ALBUMIN 3.9 07/14/2017   LABURIC 4.9 07/11/2019   LABURIC 5.1 05/07/2017   LABURIC 5.3 06/18/2016    No results found for: MG Lab Results  Component Value Date   VD25OH 34.87 05/14/2020   VD25OH 24.35 (L) 07/11/2019    No results found for: PREALBUMIN CBC EXTENDED Latest Ref Rng & Units 05/14/2020 07/11/2019 01/06/2018  WBC 4.0 - 10.5 K/uL 6.8 6.7 6.6  RBC 4.22 - 5.81 Mil/uL 4.28 4.23 4.27  HGB 13.0 - 17.0 g/dL 28.3 15.1 76.1  HCT 60.7 - 52.0 % 37.9(L) 38.3(L) 38.1(L)  PLT 150.0 - 400.0 K/uL 259.0 295.0 269.0  NEUTROABS 1.4 - 7.7 K/uL 3.9 4.1 3.3  LYMPHSABS 0.7 - 4.0 K/uL 2.1 1.8 2.3     Body mass index is 43.52 kg/m.  Orders:  No orders of the defined types were placed in this encounter.  No orders of the defined types were placed in this encounter.    Procedures: No procedures performed  Clinical Data: No additional findings.  ROS:  All other systems negative, except as noted in the HPI. Review of Systems  Objective: Vital Signs: Ht 5\' 11"  (1.803 m)   Wt (!) 312 lb (141.5 kg)   BMI 43.52 kg/m   Specialty Comments:  No specialty comments available.  PMFS History: Patient Active Problem List   Diagnosis Date Noted  . Post-traumatic osteoarthritis of right ankle 10/01/2020  . Chronic left shoulder pain 09/26/2020  . Chronic foot pain, right 09/26/2020  . Chronic pain of right ankle 09/26/2020  . Primary osteoarthritis of left hip 05/15/2020  . Chronic left hip pain  05/14/2020  . Chronic left-sided low back pain with left-sided sciatica 05/14/2020  . Tinnitus of both ears 07/11/2019  . Vitamin D deficiency 07/11/2019  . Pure hyperglyceridemia 05/20/2018  . Excessive daytime sleepiness 01/06/2018  . Morbid obesity due to excess calories (HCC) 06/19/2016  . Other seasonal allergic rhinitis 06/18/2016  . Prediabetes 06/18/2016  . Left lumbar radiculitis 08/22/2015  . Asthma, mild intermittent 05/02/2014  . Preventative health care 04/04/2011  . Hyperlipidemia with target LDL less than 130 12/21/2009  . Gout 12/21/2009  . Essential hypertension 12/21/2009  . [facility] 12/21/2009  . Anal fistula 12/21/2009  . COLONIC POLYPS, HX OF 12/21/2009   Past Medical History:  Diagnosis Date  . Anal fistula 12/21/2009  . CLOSTRIDIUM DIFFICILE COLITIS 04/19/2010  . COLONIC POLYPS, HX OF 12/21/2009  . CONJUNCTIVITIS, ACUTE 04/11/2010  . GOUT 12/21/2009  . H/O viral pericarditis   . HYPERLIPIDEMIA 12/21/2009  . HYPERTENSION 12/21/2009  . [facility] 12/21/2009  . SINUSITIS- ACUTE-NOS 04/11/2010    Family History  Adopted: Yes  Problem Relation Age of Onset  . Cancer Neg Hx   . Diabetes Neg Hx   . Early death Neg Hx   . Hearing loss Neg Hx   . Heart disease Neg Hx   . Hyperlipidemia Neg Hx   . Hypertension Neg Hx   . Stroke Neg Hx     Past Surgical History:  Procedure Laterality Date  . anal lateral inferior sphincterotomy from c diff    . right ankle ORIF     Social History   Occupational History  . 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 Smoking status: Never Smoker  . Smokeless tobacco: Never Used  Substance and Sexual Activity  . Alcohol use: Yes    Alcohol/week: 4.0 standard drinks    Types: 4 Shots of liquor per week  . Drug use: No  . Sexual activity: Yes    Birth control/protection: None

## 2020-11-14 ENCOUNTER — Other Ambulatory Visit: Payer: BLUE CROSS/BLUE SHIELD

## 2020-11-14 DIAGNOSIS — I10 Essential (primary) hypertension: Secondary | ICD-10-CM | POA: Diagnosis not present

## 2020-11-14 DIAGNOSIS — R7303 Prediabetes: Secondary | ICD-10-CM | POA: Diagnosis not present

## 2020-11-14 DIAGNOSIS — E785 Hyperlipidemia, unspecified: Secondary | ICD-10-CM | POA: Diagnosis not present

## 2020-11-29 DIAGNOSIS — Z713 Dietary counseling and surveillance: Secondary | ICD-10-CM | POA: Diagnosis not present

## 2020-12-04 ENCOUNTER — Encounter: Payer: Self-pay | Admitting: Orthopedic Surgery

## 2020-12-13 ENCOUNTER — Other Ambulatory Visit: Payer: Self-pay | Admitting: Surgical Oncology

## 2020-12-13 DIAGNOSIS — Z713 Dietary counseling and surveillance: Secondary | ICD-10-CM | POA: Diagnosis not present

## 2020-12-14 ENCOUNTER — Other Ambulatory Visit: Payer: Self-pay | Admitting: Internal Medicine

## 2020-12-14 DIAGNOSIS — G8929 Other chronic pain: Secondary | ICD-10-CM

## 2020-12-17 ENCOUNTER — Encounter: Payer: Self-pay | Admitting: Internal Medicine

## 2020-12-17 ENCOUNTER — Other Ambulatory Visit: Payer: Self-pay | Admitting: Internal Medicine

## 2020-12-17 DIAGNOSIS — M19171 Post-traumatic osteoarthritis, right ankle and foot: Secondary | ICD-10-CM

## 2020-12-19 ENCOUNTER — Ambulatory Visit
Admission: RE | Admit: 2020-12-19 | Discharge: 2020-12-19 | Disposition: A | Discharge status: Home / Self Care | Payer: BLUE CROSS/BLUE SHIELD | Source: Ambulatory Visit | Attending: Surgical Oncology | Admitting: Surgical Oncology

## 2020-12-19 DIAGNOSIS — Z01818 Encounter for other preprocedural examination: Secondary | ICD-10-CM | POA: Diagnosis not present

## 2020-12-19 DIAGNOSIS — Z9884 Bariatric surgery status: Secondary | ICD-10-CM | POA: Diagnosis not present

## 2020-12-25 ENCOUNTER — Other Ambulatory Visit: Payer: Self-pay

## 2020-12-25 ENCOUNTER — Encounter (HOSPITAL_COMMUNITY): Payer: Self-pay | Admitting: Orthopedic Surgery

## 2020-12-25 ENCOUNTER — Other Ambulatory Visit: Payer: Self-pay | Admitting: Physician Assistant

## 2020-12-25 NOTE — Progress Notes (Signed)
SDW Phone Call Complete  PCP - Dr. Sanda Linger Cardiologist - denies  Chest x-ray - n/a EKG - DOS Stress Test -2015  ECHO - denies Cardiac Cath - denies  Sleep Study - OSA+ CPAP - uses nightly; setting 12.5  Blood Thinner Instructions: n/a Aspirin Instructions: n/a  ERAS Protcol -pt can have clear liquids up until 0700.   COVID TEST- scheduled for 12/26/20.  Anesthesia review: No   Patient denies shortness of breath, fever, cough and chest pain.  All instructions explained to the patient, with a verbal understanding of the material. Patient also instructed to self quarantine after being tested for COVID-19. The opportunity to ask questions was provided.

## 2020-12-26 ENCOUNTER — Other Ambulatory Visit (HOSPITAL_COMMUNITY)
Admission: RE | Admit: 2020-12-26 | Discharge: 2020-12-26 | Disposition: A | Discharge status: Home / Self Care | Payer: BLUE CROSS/BLUE SHIELD | Source: Ambulatory Visit | Attending: Orthopedic Surgery | Admitting: Orthopedic Surgery

## 2020-12-26 DIAGNOSIS — Z01812 Encounter for preprocedural laboratory examination: Secondary | ICD-10-CM | POA: Insufficient documentation

## 2020-12-26 DIAGNOSIS — M19171 Post-traumatic osteoarthritis, right ankle and foot: Secondary | ICD-10-CM | POA: Diagnosis not present

## 2020-12-26 DIAGNOSIS — Z20822 Contact with and (suspected) exposure to covid-19: Secondary | ICD-10-CM | POA: Insufficient documentation

## 2020-12-26 DIAGNOSIS — F4320 Adjustment disorder, unspecified: Secondary | ICD-10-CM | POA: Diagnosis not present

## 2020-12-26 DIAGNOSIS — M25774 Osteophyte, right foot: Secondary | ICD-10-CM | POA: Diagnosis not present

## 2020-12-26 DIAGNOSIS — M24071 Loose body in right ankle: Secondary | ICD-10-CM | POA: Diagnosis not present

## 2020-12-26 DIAGNOSIS — M65871 Other synovitis and tenosynovitis, right ankle and foot: Secondary | ICD-10-CM | POA: Diagnosis not present

## 2020-12-26 DIAGNOSIS — Z888 Allergy status to other drugs, medicaments and biological substances status: Secondary | ICD-10-CM | POA: Diagnosis not present

## 2020-12-26 DIAGNOSIS — Z881 Allergy status to other antibiotic agents status: Secondary | ICD-10-CM | POA: Diagnosis not present

## 2020-12-26 DIAGNOSIS — G4730 Sleep apnea, unspecified: Secondary | ICD-10-CM | POA: Diagnosis not present

## 2020-12-26 LAB — SARS CORONAVIRUS 2 (TAT 6-24 HRS): SARS Coronavirus 2: NEGATIVE

## 2020-12-27 MED ORDER — DEXTROSE 5 % IV SOLN
3.0000 g | INTRAVENOUS | Status: AC
  Administered 2020-12-28: 3 g via INTRAVENOUS
  Filled 2020-12-27: qty 3

## 2020-12-28 ENCOUNTER — Ambulatory Visit (HOSPITAL_COMMUNITY)
Admission: RE | Admit: 2020-12-28 | Discharge: 2020-12-28 | Disposition: A | Discharge status: Home / Self Care | Payer: BLUE CROSS/BLUE SHIELD | Attending: Orthopedic Surgery | Admitting: Orthopedic Surgery

## 2020-12-28 ENCOUNTER — Ambulatory Visit (HOSPITAL_COMMUNITY): Payer: BLUE CROSS/BLUE SHIELD | Admitting: Physician Assistant

## 2020-12-28 ENCOUNTER — Encounter (HOSPITAL_COMMUNITY): Payer: Self-pay | Admitting: Orthopedic Surgery

## 2020-12-28 ENCOUNTER — Other Ambulatory Visit: Payer: Self-pay

## 2020-12-28 ENCOUNTER — Encounter (HOSPITAL_COMMUNITY)
Admission: RE | Disposition: A | Discharge status: Home / Self Care | Payer: Self-pay | Source: Home / Self Care | Attending: Orthopedic Surgery

## 2020-12-28 DIAGNOSIS — M7751 Other enthesopathy of right foot: Secondary | ICD-10-CM | POA: Diagnosis not present

## 2020-12-28 DIAGNOSIS — G4730 Sleep apnea, unspecified: Secondary | ICD-10-CM | POA: Diagnosis not present

## 2020-12-28 DIAGNOSIS — Z20822 Contact with and (suspected) exposure to covid-19: Secondary | ICD-10-CM | POA: Insufficient documentation

## 2020-12-28 DIAGNOSIS — Z888 Allergy status to other drugs, medicaments and biological substances status: Secondary | ICD-10-CM | POA: Insufficient documentation

## 2020-12-28 DIAGNOSIS — M25774 Osteophyte, right foot: Secondary | ICD-10-CM | POA: Diagnosis not present

## 2020-12-28 DIAGNOSIS — Z881 Allergy status to other antibiotic agents status: Secondary | ICD-10-CM | POA: Insufficient documentation

## 2020-12-28 DIAGNOSIS — G8918 Other acute postprocedural pain: Secondary | ICD-10-CM | POA: Diagnosis not present

## 2020-12-28 DIAGNOSIS — M65871 Other synovitis and tenosynovitis, right ankle and foot: Secondary | ICD-10-CM | POA: Insufficient documentation

## 2020-12-28 DIAGNOSIS — M24071 Loose body in right ankle: Secondary | ICD-10-CM | POA: Insufficient documentation

## 2020-12-28 DIAGNOSIS — M19171 Post-traumatic osteoarthritis, right ankle and foot: Secondary | ICD-10-CM | POA: Insufficient documentation

## 2020-12-28 DIAGNOSIS — E559 Vitamin D deficiency, unspecified: Secondary | ICD-10-CM | POA: Diagnosis not present

## 2020-12-28 DIAGNOSIS — I10 Essential (primary) hypertension: Secondary | ICD-10-CM | POA: Diagnosis not present

## 2020-12-28 HISTORY — DX: Anemia, unspecified: D64.9

## 2020-12-28 HISTORY — DX: Unspecified asthma, uncomplicated: J45.909

## 2020-12-28 HISTORY — PX: ANKLE ARTHROSCOPY: SHX545

## 2020-12-28 HISTORY — DX: Unspecified osteoarthritis, unspecified site: M19.90

## 2020-12-28 HISTORY — DX: Other complications of anesthesia, initial encounter: T88.59XA

## 2020-12-28 HISTORY — DX: Sleep apnea, unspecified: G47.30

## 2020-12-28 LAB — COMPREHENSIVE METABOLIC PANEL
ALT: 53 U/L — ABNORMAL HIGH (ref 0–44)
AST: 32 U/L (ref 15–41)
Albumin: 3.9 g/dL (ref 3.5–5.0)
Alkaline Phosphatase: 32 U/L — ABNORMAL LOW (ref 38–126)
Anion gap: 11 (ref 5–15)
BUN: 18 mg/dL (ref 6–20)
CO2: 22 mmol/L (ref 22–32)
Calcium: 9.3 mg/dL (ref 8.9–10.3)
Chloride: 106 mmol/L (ref 98–111)
Creatinine, Ser: 0.89 mg/dL (ref 0.61–1.24)
GFR, Estimated: >60 mL/min (ref >60)
Glucose, Bld: 133 mg/dL — ABNORMAL HIGH (ref 70–99)
Potassium: 3.5 mmol/L (ref 3.5–5.1)
Sodium: 139 mmol/L (ref 135–145)
Total Bilirubin: 0.7 mg/dL (ref 0.3–1.2)
Total Protein: 6.5 g/dL (ref 6.5–8.1)

## 2020-12-28 LAB — GLUCOSE, CAPILLARY: Glucose-Capillary: 129 mg/dL — ABNORMAL HIGH (ref 70–99)

## 2020-12-28 SURGERY — ARTHROSCOPY, ANKLE
Anesthesia: General | Site: Ankle | Laterality: Right

## 2020-12-28 MED ORDER — ROPIVACAINE HCL 5 MG/ML IJ SOLN
INTRAMUSCULAR | Status: DC | PRN
  Administered 2020-12-28: 20 mL via PERINEURAL
  Administered 2020-12-28: 30 mL via PERINEURAL

## 2020-12-28 MED ORDER — ORAL CARE MOUTH RINSE: 15.0000 mL | Freq: Once | OROMUCOSAL | Status: AC

## 2020-12-28 MED ORDER — OXYCODONE-ACETAMINOPHEN 5-325 MG PO TABS
ORAL_TABLET | ORAL | Status: AC
  Filled 2020-12-28: qty 1

## 2020-12-28 MED ORDER — BUPIVACAINE HCL (PF) 0.25 % IJ SOLN
INTRAMUSCULAR | Status: AC
  Filled 2020-12-28: qty 30

## 2020-12-28 MED ORDER — BUPIVACAINE HCL (PF) 0.25 % IJ SOLN
INTRAMUSCULAR | Status: DC | PRN
  Administered 2020-12-28: 30 mL

## 2020-12-28 MED ORDER — AMISULPRIDE (ANTIEMETIC) 5 MG/2ML IV SOLN: 10.0000 mg | Freq: Once | INTRAVENOUS | Status: DC | PRN

## 2020-12-28 MED ORDER — OXYCODONE HCL 5 MG PO TABS: 5.0000 mg | ORAL_TABLET | Freq: Once | ORAL | Status: DC | PRN

## 2020-12-28 MED ORDER — FENTANYL CITRATE (PF) 100 MCG/2ML IJ SOLN
INTRAMUSCULAR | Status: AC
  Administered 2020-12-28: 50 ug via INTRAVENOUS
  Filled 2020-12-28: qty 2

## 2020-12-28 MED ORDER — LIDOCAINE 2% (20 MG/ML) 5 ML SYRINGE
INTRAMUSCULAR | Status: DC | PRN
  Administered 2020-12-28: 20 mg via INTRAVENOUS

## 2020-12-28 MED ORDER — OXYCODONE-ACETAMINOPHEN 5-325 MG PO TABS: 1.0000 | ORAL_TABLET | ORAL | 0 refills | Status: DC | PRN

## 2020-12-28 MED ORDER — FENTANYL CITRATE (PF) 250 MCG/5ML IJ SOLN
INTRAMUSCULAR | Status: DC | PRN
  Administered 2020-12-28 (×2): 25 ug via INTRAVENOUS
  Administered 2020-12-28 (×4): 50 ug via INTRAVENOUS

## 2020-12-28 MED ORDER — PROPOFOL 500 MG/50ML IV EMUL
INTRAVENOUS | Status: DC | PRN
  Administered 2020-12-28: 100 ug/kg/min via INTRAVENOUS

## 2020-12-28 MED ORDER — 0.9 % SODIUM CHLORIDE (POUR BTL) OPTIME
TOPICAL | Status: DC | PRN
  Administered 2020-12-28: 1000 mL

## 2020-12-28 MED ORDER — METOPROLOL TARTRATE 25 MG PO TABS
25.0000 mg | ORAL_TABLET | Freq: Once | ORAL | Status: AC
  Filled 2020-12-28: qty 1

## 2020-12-28 MED ORDER — METOPROLOL TARTRATE 12.5 MG HALF TABLET
ORAL_TABLET | ORAL | Status: AC
  Administered 2020-12-28: 25 mg via ORAL
  Filled 2020-12-28: qty 2

## 2020-12-28 MED ORDER — FENTANYL CITRATE (PF) 100 MCG/2ML IJ SOLN: 25.0000 ug | INTRAMUSCULAR | Status: DC | PRN

## 2020-12-28 MED ORDER — LACTATED RINGERS IV SOLN: INTRAVENOUS | Status: DC

## 2020-12-28 MED ORDER — CHLORHEXIDINE GLUCONATE 0.12 % MT SOLN
15.0000 mL | Freq: Once | OROMUCOSAL | Status: AC
  Administered 2020-12-28: 15 mL via OROMUCOSAL
  Filled 2020-12-28: qty 15

## 2020-12-28 MED ORDER — FENTANYL CITRATE (PF) 250 MCG/5ML IJ SOLN
INTRAMUSCULAR | Status: AC
  Filled 2020-12-28: qty 5

## 2020-12-28 MED ORDER — ONDANSETRON HCL 4 MG/2ML IJ SOLN: 4.0000 mg | Freq: Once | INTRAMUSCULAR | Status: DC | PRN

## 2020-12-28 MED ORDER — OXYCODONE HCL 5 MG/5ML PO SOLN
5.0000 mg | Freq: Once | ORAL | Status: DC | PRN
Start: 2020-12-28 — End: 2020-12-28

## 2020-12-28 MED ORDER — MIDAZOLAM HCL 2 MG/2ML IJ SOLN: 2.0000 mg | Freq: Once | INTRAMUSCULAR | Status: AC

## 2020-12-28 MED ORDER — OXYCODONE-ACETAMINOPHEN 5-325 MG PO TABS
1.0000 | ORAL_TABLET | Freq: Once | ORAL | Status: AC
  Administered 2020-12-28: 1 via ORAL

## 2020-12-28 MED ORDER — MIDAZOLAM HCL 2 MG/2ML IJ SOLN
INTRAMUSCULAR | Status: AC
  Administered 2020-12-28: 2 mg via INTRAVENOUS
  Filled 2020-12-28: qty 2

## 2020-12-28 MED ORDER — FENTANYL CITRATE (PF) 100 MCG/2ML IJ SOLN: 50.0000 ug | Freq: Once | INTRAMUSCULAR | Status: AC

## 2020-12-28 MED ORDER — ASPIRIN EC 325 MG PO TBEC: 325.0000 mg | DELAYED_RELEASE_TABLET | Freq: Every day | ORAL | 0 refills | Status: DC

## 2020-12-28 MED ORDER — SODIUM CHLORIDE 0.9 % IR SOLN
Status: DC | PRN
  Administered 2020-12-28: 3000 mL

## 2020-12-28 SURGICAL SUPPLY — 36 items
BNDG COHESIVE 6X5 TAN NS LF (GAUZE/BANDAGES/DRESSINGS) ×2 IMPLANT
BNDG COHESIVE 6X5 TAN STRL LF (GAUZE/BANDAGES/DRESSINGS) ×2 IMPLANT
BNDG GAUZE ELAST 4 BULKY (GAUZE/BANDAGES/DRESSINGS) ×2 IMPLANT
COVER SURGICAL LIGHT HANDLE (MISCELLANEOUS) ×4 IMPLANT
COVER WAND RF STERILE (DRAPES) ×2 IMPLANT
DRAPE ARTHROSCOPY W/POUCH 114 (DRAPES) ×2 IMPLANT
DRAPE U-SHAPE 47X51 STRL (DRAPES) ×2 IMPLANT
DRSG ADAPTIC 3X8 NADH LF (GAUZE/BANDAGES/DRESSINGS) ×2 IMPLANT
DRSG EMULSION OIL 3X3 NADH (GAUZE/BANDAGES/DRESSINGS) ×2 IMPLANT
DRSG PAD ABDOMINAL 8X10 ST (GAUZE/BANDAGES/DRESSINGS) ×2 IMPLANT
DURAPREP 26ML APPLICATOR (WOUND CARE) ×2 IMPLANT
GAUZE SPONGE 4X4 12PLY STRL (GAUZE/BANDAGES/DRESSINGS) ×2 IMPLANT
GLOVE BIOGEL PI IND STRL 9 (GLOVE) ×1 IMPLANT
GLOVE BIOGEL PI INDICATOR 9 (GLOVE) ×1
GLOVE SURG ORTHO 9.0 STRL STRW (GLOVE) ×2 IMPLANT
GOWN STRL REUS W/ TWL XL LVL3 (GOWN DISPOSABLE) ×3 IMPLANT
GOWN STRL REUS W/TWL XL LVL3 (GOWN DISPOSABLE) ×3
KIT BASIN OR (CUSTOM PROCEDURE TRAY) ×2 IMPLANT
KIT TURNOVER KIT B (KITS) ×2 IMPLANT
MANIFOLD NEPTUNE II (INSTRUMENTS) ×2 IMPLANT
NEEDLE 18GX1X1/2 (RX/OR ONLY) (NEEDLE) ×2 IMPLANT
PACK ARTHROSCOPY DSU (CUSTOM PROCEDURE TRAY) ×2 IMPLANT
PAD ARMBOARD 7.5X6 YLW CONV (MISCELLANEOUS) ×4 IMPLANT
PADDING CAST COTTON 6X4 STRL (CAST SUPPLIES) ×2 IMPLANT
PORT APPOLLO RF 90DEGREE MULTI (SURGICAL WAND) ×2 IMPLANT
SPONGE LAP 4X18 RFD (DISPOSABLE) ×2 IMPLANT
SUT ETHILON 4 0 PS 2 18 (SUTURE) ×2 IMPLANT
SUT MNCRL AB 3-0 PS2 18 (SUTURE) IMPLANT
SUT VIC AB 2-0 CT1 27 (SUTURE)
SUT VIC AB 2-0 CT1 TAPERPNT 27 (SUTURE) IMPLANT
SYR 20ML LL LF (SYRINGE) ×2 IMPLANT
TAPE STRIPS DRAPE STRL (GAUZE/BANDAGES/DRESSINGS) IMPLANT
TOWEL GREEN STERILE (TOWEL DISPOSABLE) ×2 IMPLANT
TOWEL GREEN STERILE FF (TOWEL DISPOSABLE) ×2 IMPLANT
TUBING ARTHROSCOPY IRRIG 16FT (MISCELLANEOUS) ×2 IMPLANT
WATER STERILE IRR 1000ML POUR (IV SOLUTION) ×2 IMPLANT

## 2020-12-28 NOTE — Anesthesia Procedure Notes (Signed)
Anesthesia Regional Block: Popliteal block   Pre-Anesthetic Checklist: ,, timeout performed, Correct Patient, Correct Site, Correct Laterality, Correct Procedure, Correct Position, site marked, Risks and benefits discussed,  Surgical consent,  Pre-op evaluation,  At surgeon's request and post-op pain management  Laterality: Right  Prep: chloraprep       Needles:  Injection technique: Single-shot  Needle Type: Echogenic Stimulator Needle     Needle Length: 10cm  Needle Gauge: 20     Additional Needles:   Procedures:,,,, ultrasound used (permanent image in chart),,,,  Narrative:  Start time: 12/28/2020 9:49 AM End time: 12/28/2020 9:58 AM  Performed by: Personally  Anesthesiologist: Lucretia Kern, MD  Additional Notes: Standard monitors applied. Skin prepped. Good needle visualization with ultrasound. Injection made in 5cc increments with no resistance to injection. Patient tolerated the procedure well.

## 2020-12-28 NOTE — Anesthesia Procedure Notes (Signed)
Anesthesia Regional Block: Adductor canal block   Pre-Anesthetic Checklist: ,, timeout performed, Correct Patient, Correct Site, Correct Laterality, Correct Procedure, Correct Position, site marked, Risks and benefits discussed,  Surgical consent,  Pre-op evaluation,  At surgeon's request and post-op pain management  Laterality: Right  Prep: chloraprep       Needles:  Injection technique: Single-shot  Needle Type: Echogenic Stimulator Needle     Needle Length: 10cm  Needle Gauge: 20     Additional Needles:   Procedures:,,,, ultrasound used (permanent image in chart),,,,  Narrative:  Start time: 12/28/2020 9:45 AM End time: 12/28/2020 9:49 AM Injection made incrementally with aspirations every 5 mL.  Performed by: Personally  Anesthesiologist: Lucretia Kern, MD  Additional Notes: Standard monitors applied. Skin prepped. Good needle visualization with ultrasound. Injection made in 5cc increments with no resistance to injection. Patient tolerated the procedure well.

## 2020-12-28 NOTE — Interval H&P Note (Signed)
History and Physical Interval Note:  12/28/2020 9:50 AM  Rickey Hurley  has presented today for surgery, with the diagnosis of Traumatic Arthritis Right Ankle.  The various methods of treatment have been discussed with the patient and family. After consideration of risks, benefits and other options for treatment, the patient has consented to  Procedure(s): RIGHT ANKLE ARTHROSCOPY, DEBRIDEMENT AND EXCISION BONE SPUR (Right) as a surgical intervention.  The patient's history has been reviewed, patient examined, no change in status, stable for surgery.  I have reviewed the patient's chart and labs.  Questions were answered to the patient's satisfaction.     Nadara Mustard

## 2020-12-28 NOTE — Transfer of Care (Signed)
Immediate Anesthesia Transfer of Care Note  Patient: Rickey Hurley  Procedure(s) Performed: RIGHT ANKLE ARTHROSCOPY, DEBRIDEMENT AND EXCISION BONE SPUR (Right Ankle)  Patient Location: PACU  Anesthesia Type:MAC combined with regional for post-op pain  Level of Consciousness: awake, alert , oriented and patient cooperative  Airway & Oxygen Therapy: Patient Spontanous Breathing and Patient connected to face mask oxygen  Post-op Assessment: Report given to RN, Post -op Vital signs reviewed and stable and Patient moving all extremities  Post vital signs: Reviewed and stable  Last Vitals:  Vitals Value Taken Time  BP 129/96 12/28/20 1131  Temp    Pulse 70 12/28/20 1132  Resp 16 12/28/20 1132  SpO2 99 % 12/28/20 1132  Vitals shown include unvalidated device data.  Last Pain:  Vitals:   12/28/20 0820  TempSrc:   PainSc: 3          Complications: No complications documented.

## 2020-12-28 NOTE — Anesthesia Postprocedure Evaluation (Signed)
Anesthesia Post Note  Patient: Rickey Hurley  Procedure(s) Performed: RIGHT ANKLE ARTHROSCOPY, DEBRIDEMENT AND EXCISION BONE SPUR (Right Ankle)     Patient location during evaluation: PACU Anesthesia Type: MAC and Regional Level of consciousness: awake and alert Pain management: pain level controlled Vital Signs Assessment: post-procedure vital signs reviewed and stable Respiratory status: spontaneous breathing, nonlabored ventilation and respiratory function stable Cardiovascular status: blood pressure returned to baseline and stable Postop Assessment: no apparent nausea or vomiting Anesthetic complications: no   No complications documented.  Last Vitals:  Vitals:   12/28/20 1200 12/28/20 1201  BP: (!) 147/102 128/80  Pulse: (!) 56 (!) 57  Resp: 17 14  Temp:    SpO2: 100% 100%    Last Pain:  Vitals:   12/28/20 1225  TempSrc:   PainSc: 2                  Lidia Collum

## 2020-12-28 NOTE — Anesthesia Preprocedure Evaluation (Addendum)
Anesthesia Evaluation  Patient identified by MRN, date of birth, ID band Patient awake    Reviewed: Allergy & Precautions, NPO status , Patient's Chart, lab work & pertinent test results  History of Anesthesia Complications Negative for: history of anesthetic complications  Airway Mallampati: II  TM Distance: >3 FB Neck ROM: Full    Dental  (+) Teeth Intact   Pulmonary asthma , sleep apnea ,    Pulmonary exam normal        Cardiovascular hypertension, Normal cardiovascular exam     Neuro/Psych negative neurological ROS  negative psych ROS   GI/Hepatic negative GI ROS, Neg liver ROS,   Endo/Other  Morbid obesity  Renal/GU negative Renal ROS  negative genitourinary   Musculoskeletal  (+) Arthritis ,   Abdominal   Peds  Hematology negative hematology ROS (+)   Anesthesia Other Findings   Reproductive/Obstetrics                             Anesthesia Physical Anesthesia Plan  ASA: III  Anesthesia Plan: MAC and Regional   Post-op Pain Management:    Induction: Intravenous  PONV Risk Score and Plan: 1 and Propofol infusion, TIVA and Treatment may vary due to age or medical condition  Airway Management Planned: Natural Airway, Nasal Cannula and Simple Face Mask  Additional Equipment: None  Intra-op Plan:   Post-operative Plan:   Informed Consent: I have reviewed the patients History and Physical, chart, labs and discussed the procedure including the risks, benefits and alternatives for the proposed anesthesia with the patient or authorized representative who has indicated his/her understanding and acceptance.       Plan Discussed with:   Anesthesia Plan Comments:        Anesthesia Quick Evaluation

## 2020-12-28 NOTE — H&P (Signed)
Rickey Hurley is an 55 y.o. male.   Chief Complaint: Right Ankle Pain HPI:  Patient is a 55 year old gentleman who presents for follow-up status post CT scan of his right ankle.  Patient still has pain through the midfoot pain over bone spur dorsal lateral aspect of the right foot as well as pain over the anterior aspect of the ankle joint.  Patient has had some temporary relief with Voltaren gel.   Past Medical History:  Diagnosis Date  . Anal fistula 12/21/2009  . Anemia   . Arthritis   . Asthma   . CLOSTRIDIUM DIFFICILE COLITIS 04/19/2010  . COLONIC POLYPS, HX OF 12/21/2009  . Complication of anesthesia    trouble keeping pt asleep.   [facility] CONJUNCTIVITIS, ACUTE 04/11/2010  . GOUT 12/21/2009  . H/O viral pericarditis   . HYPERLIPIDEMIA 12/21/2009  . HYPERTENSION 12/21/2009  . [facility] 12/21/2009  . SINUSITIS- ACUTE-NOS 04/11/2010  . Sleep apnea     Past Surgical History:  Procedure Laterality Date  . anal lateral inferior sphincterotomy from c diff    . ANKLE DEBRIDEMENT Right   . right ankle ORIF      Family History  Adopted: Yes  Problem Relation Age of Onset  . Cancer Neg Hx   . Diabetes Neg Hx   . Early death Neg Hx   . Hearing loss Neg Hx   . Heart disease Neg Hx   . Hyperlipidemia Neg Hx   . Hypertension Neg Hx   . Stroke Neg Hx    Social History:  reports that he has never smoked. He has never used smokeless tobacco. He reports current alcohol use of about 4.0 standard drinks of alcohol per week. He reports that he does not use drugs.  Allergies:  Allergies  Allergen Reactions  . Lisinopril Cough  . Moxifloxacin     REACTION: c diff with subsequent bowel resection    No medications prior to admission.    Results for orders placed or performed during the hospital encounter of 12/26/20 (from the past 48 hour(s))  SARS CORONAVIRUS 2 (TAT 6-24 HRS) Nasopharyngeal Nasopharyngeal Swab     Status: None   Collection Time: 12/26/20  9:47 AM   Specimen: Nasopharyngeal Swab   Result Value Ref Range   SARS Coronavirus 2 NEGATIVE NEGATIVE    Comment: (NOTE) SARS-CoV-2 target nucleic acids are NOT DETECTED.  The SARS-CoV-2 RNA is generally detectable in upper and lower respiratory specimens during the acute phase of infection. Negative results do not preclude SARS-CoV-2 infection, do not rule out co-infections with other pathogens, and should not be used as the sole basis for treatment or other patient management decisions. Negative results must be combined with clinical observations, patient history, and epidemiological information. The expected result is Negative.  Fact Sheet for Patients: HairSlick.no  Fact Sheet for Healthcare Providers: quierodirigir.com  This test is not yet approved or cleared by the [place] FDA and  has been authorized for detection and/or diagnosis of SARS-CoV-2 by FDA under an Emergency Use Authorization (EUA). This EUA will remain  in effect (meaning this test can be used) for the duration of the COVID-19 declaration under Se ction 564(b)(1) of the Act, 21 U.S.C. section 360bbb-3(b)(1), unless the authorization is terminated or revoked sooner.  Performed at [facility], [address], [place], [place] [address]    No results found.  Review of Systems  All other systems reviewed and are negative.   Height 5\' 11"  (1.803 m), weight )  142.9 kg. Physical Exam   Patient is alert, oriented, no adenopathy, well-dressed, normal affect, normal respiratory effort. Examination patient has good range of motion the ankle and subtalar joint.  He has a palpable pulse there is a prominent bony spur over the dorsal lateral aspect of the talus which is painful and pain with shoe wear.  Patient has pain to palpation of the anterior aspect of the ankle.  Radiograph shows osteophytic bone spurs at the tibial talar joint as well as osteophytic bone spurs dorsally over the  talar head.  Review of the CT scan shows no cystic changes no lytic bony changes no evidence of any stress fractures.Heart RRR Lungs Clear Assessment/Plan Diagnoses:  1. Post-traumatic osteoarthritis, right ankle and foot     Plan: Due to the persistent pain difficulty with activities of daily living patient states he would like to proceed with arthroscopic debridement of the right ankle and excision of the bony spur over the dorsal lateral aspect of the right foot.  Risks and benefits were discussed including persistent pain risk of infection potential for additional surgery.  Patient states she understands wished to proceed at this time.  Patient does have sleep apnea and uses a CPAP machine most likely will proceed with surgery at [facility] Main OR.    [place] Zadaya Cuadra, PA 12/28/2020, 6:39 AM

## 2020-12-28 NOTE — Op Note (Signed)
12/28/2020  11:43 AM  PATIENT:  Rickey Hurley    PRE-OPERATIVE DIAGNOSIS:  Traumatic Arthritis Right Ankle Bone spur dorsal lateral talus.  POST-OPERATIVE DIAGNOSIS:  Same  PROCEDURE:  RIGHT ANKLE ARTHROSCOPY,  EXCISION BONE SPUR dorsal lateral talus.  SURGEON:  Nadara Mustard, MD  PHYSICIAN ASSISTANT:None ANESTHESIA:   General  PREOPERATIVE INDICATIONS:  Rickey Hurley is a  55 y.o. male with a diagnosis of Traumatic Arthritis Right Ankle who failed conservative measures and elected for surgical management.    The risks benefits and alternatives were discussed with the patient preoperatively including but not limited to the risks of infection, bleeding, nerve injury, cardiopulmonary complications, the need for revision surgery, among others, and the patient was willing to proceed.  OPERATIVE IMPLANTS: None  @ENCIMAGES @  OPERATIVE FINDINGS: Patient had multiple loose bodies as well as extensive synovitis within the ankle joint with osteochondral degenerative changes.  Patient had a loose body over the dorsal lateral aspect of the talus that was impinging on the branches of the superficial peroneal nerve as well as the extensor tendons.  OPERATIVE PROCEDURE: Patient was brought the operating room after undergoing a regional anesthetic.  After adequate levels anesthesia were obtained patient's right lower extremity was prepped using DuraPrep draped into a sterile field a timeout was called.  A oblique incision was made over the bony spur over the dorsal lateral aspect of the talus in line with the branches of the superficial peroneal nerve.  Blunt dissection was carried down to the bony spur the extensor tendon and branches of the superficial peroneal nerve were retracted.  A rondure was used to resect the bony spur.  The wound was irrigated with normal saline the incision was closed using 2-0 nylon.  Attention was then focused on the ankle.  The scope was inserted to the anterior medial portal and  an anterior lateral portal was established both with blunt trochars with the skin incised blunt dissection carried down to the capsule.  Visualization showed extensive synovitis with the joint completely filled with synovial scar tissue.  The shaver was used to start the debridement the electrocautery was used for hemostasis there was a loose foreign body over the anterior aspect of the distal tibia and this was removed with a grabber.  There were other loose bodies that were also resected.  The joint was cleansed and the medial lateral gutters were visualized as well as the anterior joint line.  There was degenerative changes of the cartilage.  The joint was congruent.  No full-thickness osteochondral defect.  The instruments were removed the portals were closed using 2-0 nylon and a sterile dressing was applied patient was taken the PACU in stable condition   DISCHARGE PLANNING:  Antibiotic duration: Preoperative antibiotics 3 g Kefzol  Weightbearing: Weightbearing as tolerated with crutches  Pain medication: Percocet  Dressing care/ Wound VAC: Discontinue dressing in 2 days  Ambulatory devices: Crutches  Discharge to: Home.  Follow-up: In the office 1 week post operative.

## 2020-12-29 ENCOUNTER — Encounter (HOSPITAL_COMMUNITY): Payer: Self-pay | Admitting: Orthopedic Surgery

## 2020-12-31 DIAGNOSIS — Z713 Dietary counseling and surveillance: Secondary | ICD-10-CM | POA: Diagnosis not present

## 2021-01-04 ENCOUNTER — Encounter: Payer: Self-pay | Admitting: Physician Assistant

## 2021-01-04 ENCOUNTER — Ambulatory Visit (INDEPENDENT_AMBULATORY_CARE_PROVIDER_SITE_OTHER): Payer: BLUE CROSS/BLUE SHIELD | Admitting: Physician Assistant

## 2021-01-04 VITALS — Ht 71.0 in | Wt 319.0 lb

## 2021-01-04 DIAGNOSIS — M7751 Other enthesopathy of right foot: Secondary | ICD-10-CM

## 2021-01-04 NOTE — Progress Notes (Signed)
Office Visit Note   Patient: Rickey Hurley           Date of Birth: 1966-04-26           MRN: [id number] Visit Date: 01/04/2021              Requested by: Etta Grandchild, MD [address] [place],  [place] [address] PCP: Etta Grandchild, MD  Chief Complaint  Patient presents with  . Right Ankle - Follow-up, Routine Post Op    Right ankle scope and debridement on 12/28/2020      HPI: Patient presents today 1 week status post right ankle arthroscopy with excision of bone spur he is doing great has no complaints  Assessment & Plan: Visit Diagnoses: No diagnosis found.  Plan: Patient will follow up early midweek next week for removal of sutures.  He may wean out of the boot at that time.  May begin daily cleansing.  Encouraged range of motion of his ankle.  Follow-Up Instructions: No follow-ups on file.   Ortho Exam  Patient is alert, oriented, no adenopathy, well-dressed, normal affect, normal respiratory effort. Overall well-healed surgical incisions.  Still some swelling.  No erythema no cellulitis no signs of infection has range of motion of his ankle without difficulty  Imaging: No results found. No images are attached to the encounter.  Labs: Lab Results  Component Value Date   HGBA1C 5.6 07/11/2019   HGBA1C 5.7 05/07/2017   HGBA1C 6.2 06/18/2016   ESRSEDRATE 8 04/19/2010   LABURIC 4.9 07/11/2019   LABURIC 5.1 05/07/2017   LABURIC 5.3 06/18/2016   LABORGA NO GROWTH 04/14/2017     Lab Results  Component Value Date   ALBUMIN 3.9 12/28/2020   ALBUMIN 4.3 07/11/2019   ALBUMIN 4.3 01/06/2018   LABURIC 4.9 07/11/2019   LABURIC 5.1 05/07/2017   LABURIC 5.3 06/18/2016    No results found for: MG Lab Results  Component Value Date   VD25OH 34.87 05/14/2020   VD25OH 24.35 (L) 07/11/2019    No results found for: PREALBUMIN CBC EXTENDED Latest Ref Rng & Units 05/14/2020 07/11/2019 01/06/2018  WBC 4.0 - 10.5 K/uL 6.8 6.7 6.6  RBC 4.22 - 5.81 Mil/uL 4.28 4.23  4.27  HGB 13.0 - 17.0 g/dL 00.9 38.1 82.9  HCT 93.7 - 52.0 % 37.9(L) 38.3(L) 38.1(L)  PLT 150.0 - 400.0 K/uL 259.0 295.0 269.0  NEUTROABS 1.4 - 7.7 K/uL 3.9 4.1 3.3  LYMPHSABS 0.7 - 4.0 K/uL 2.1 1.8 2.3     Body mass index is 44.49 kg/m.  Orders:  No orders of the defined types were placed in this encounter.  No orders of the defined types were placed in this encounter.    Procedures: No procedures performed  Clinical Data: No additional findings.  ROS:  All other systems negative, except as noted in the HPI. Review of Systems  Objective: Vital Signs: Ht 5\' 11"  (1.803 m)   Wt (!) 319 lb (144.7 kg)   BMI 44.49 kg/m   Specialty Comments:  No specialty comments available.  PMFS History: Patient Active Problem List   Diagnosis Date Noted  . Bone spur of right foot   . Post-traumatic osteoarthritis of right ankle 10/01/2020  . Chronic left shoulder pain 09/26/2020  . Chronic foot pain, right 09/26/2020  . Chronic pain of right ankle 09/26/2020  . Primary osteoarthritis of left hip 05/15/2020  . Chronic left hip pain 05/14/2020  . Chronic left-sided low back pain with left-sided sciatica  05/14/2020  . Tinnitus of both ears 07/11/2019  . Vitamin D deficiency 07/11/2019  . Pure hyperglyceridemia 05/20/2018  . Excessive daytime sleepiness 01/06/2018  . Morbid obesity due to excess calories (HCC) 06/19/2016  . Other seasonal allergic rhinitis 06/18/2016  . Prediabetes 06/18/2016  . Left lumbar radiculitis 08/22/2015  . Asthma, mild intermittent 05/02/2014  . Preventative health care 04/04/2011  . Hyperlipidemia with target LDL less than 130 12/21/2009  . Gout 12/21/2009  . Essential hypertension 12/21/2009  . [facility] 12/21/2009  . Anal fistula 12/21/2009  . COLONIC POLYPS, HX OF 12/21/2009   Past Medical History:  Diagnosis Date  . Anal fistula 12/21/2009  . Anemia   . Arthritis   . Asthma   . CLOSTRIDIUM DIFFICILE COLITIS 04/19/2010  . COLONIC POLYPS, HX OF  12/21/2009  . Complication of anesthesia    trouble keeping pt asleep.   Marland Kitchen CONJUNCTIVITIS, ACUTE 04/11/2010  . GOUT 12/21/2009  . H/O viral pericarditis   . HYPERLIPIDEMIA 12/21/2009  . HYPERTENSION 12/21/2009  . [facility] 12/21/2009  . SINUSITIS- ACUTE-NOS 04/11/2010  . Sleep apnea     Family History  Adopted: Yes  Problem Relation Age of Onset  . Cancer Neg Hx   . Diabetes Neg Hx   . Early death Neg Hx   . Hearing loss Neg Hx   . Heart disease Neg Hx   . Hyperlipidemia Neg Hx   . Hypertension Neg Hx   . Stroke Neg Hx     Past Surgical History:  Procedure Laterality Date  . anal lateral inferior sphincterotomy from c diff    . ANKLE ARTHROSCOPY Right 12/28/2020   Procedure: RIGHT ANKLE ARTHROSCOPY, DEBRIDEMENT AND EXCISION BONE SPUR;  Surgeon: Nadara Mustard, MD;  Location: [facility] OR;  Service: Orthopedics;  Laterality: Right;  . ANKLE DEBRIDEMENT Right   . right ankle ORIF     Social History   Occupational History  . Occupation: vice Management consultant: [employer]   Tobacco Use  . Smoking status: Never Smoker  . Smokeless tobacco: Never Used  Vaping Use  . Vaping Use: Never used  Substance and Sexual Activity  . Alcohol use: Yes    Alcohol/week: 4.0 standard drinks    Types: 4 Shots of liquor per week    Comment: socially  . Drug use: No  . Sexual activity: Yes    Birth control/protection: None

## 2021-01-10 ENCOUNTER — Ambulatory Visit (INDEPENDENT_AMBULATORY_CARE_PROVIDER_SITE_OTHER): Payer: BLUE CROSS/BLUE SHIELD | Admitting: Physician Assistant

## 2021-01-10 ENCOUNTER — Encounter: Payer: Self-pay | Admitting: Orthopedic Surgery

## 2021-01-10 DIAGNOSIS — M7751 Other enthesopathy of right foot: Secondary | ICD-10-CM

## 2021-01-10 NOTE — Progress Notes (Signed)
Office Visit Note   Patient: Rickey Hurley           Date of Birth: Jan 17, 1966           MRN: [id number] Visit Date: 01/10/2021              Requested by: Etta Grandchild, MD [address] [place],  [place] [address] PCP: Etta Grandchild, MD  Chief Complaint  Patient presents with  . Right Ankle - Pain, Routine Post Op, Follow-up      HPI: Patient is 2 weeks status post right ankle scope debridement and excision of bone spur.  He is doing well without complaints the only thing he complains of is swelling denies any calf pain  Assessment & Plan: Visit Diagnoses: No diagnosis found.  Plan: Patient will continue to work on swelling control range of motion and strengthening.  I did discuss with him that he would do well with a compression sock and have given him information and sized him.  Follow-up for final visit in 4 weeks  Follow-Up Instructions: No follow-ups on file.   Ortho Exam  Patient is alert, oriented, no adenopathy, well-dressed, normal affect, normal respiratory effort. Focused examination demonstrates well-healed surgical portals and incision.  Moderate soft tissue swelling but no cellulitis or erythema compartments are soft and compressible.  Range of motion is fairly painless.  No signs of infection  Imaging: No results found. No images are attached to the encounter.  Labs: Lab Results  Component Value Date   HGBA1C 5.6 07/11/2019   HGBA1C 5.7 05/07/2017   HGBA1C 6.2 06/18/2016   ESRSEDRATE 8 04/19/2010   LABURIC 4.9 07/11/2019   LABURIC 5.1 05/07/2017   LABURIC 5.3 06/18/2016   LABORGA NO GROWTH 04/14/2017     Lab Results  Component Value Date   ALBUMIN 3.9 12/28/2020   ALBUMIN 4.3 07/11/2019   ALBUMIN 4.3 01/06/2018   LABURIC 4.9 07/11/2019   LABURIC 5.1 05/07/2017   LABURIC 5.3 06/18/2016    No results found for: MG Lab Results  Component Value Date   VD25OH 34.87 05/14/2020   VD25OH 24.35 (L) 07/11/2019    No results found for:  PREALBUMIN CBC EXTENDED Latest Ref Rng & Units 05/14/2020 07/11/2019 01/06/2018  WBC 4.0 - 10.5 K/uL 6.8 6.7 6.6  RBC 4.22 - 5.81 Mil/uL 4.28 4.23 4.27  HGB 13.0 - 17.0 g/dL 12.2 48.2 50.0  HCT 37.0 - 52.0 % 37.9(L) 38.3(L) 38.1(L)  PLT 150.0 - 400.0 K/uL 259.0 295.0 269.0  NEUTROABS 1.4 - 7.7 K/uL 3.9 4.1 3.3  LYMPHSABS 0.7 - 4.0 K/uL 2.1 1.8 2.3     There is no height or weight on file to calculate BMI.  Orders:  No orders of the defined types were placed in this encounter.  No orders of the defined types were placed in this encounter.    Procedures: No procedures performed  Clinical Data: No additional findings.  ROS:  All other systems negative, except as noted in the HPI. Review of Systems  Objective: Vital Signs: There were no vitals taken for this visit.  Specialty Comments:  No specialty comments available.  PMFS History: Patient Active Problem List   Diagnosis Date Noted  . Bone spur of right foot   . Post-traumatic osteoarthritis of right ankle 10/01/2020  . Chronic left shoulder pain 09/26/2020  . Chronic foot pain, right 09/26/2020  . Chronic pain of right ankle 09/26/2020  . Primary osteoarthritis of left hip 05/15/2020  . Chronic  left hip pain 05/14/2020  . Chronic left-sided low back pain with left-sided sciatica 05/14/2020  . Tinnitus of both ears 07/11/2019  . Vitamin D deficiency 07/11/2019  . Pure hyperglyceridemia 05/20/2018  . Excessive daytime sleepiness 01/06/2018  . Morbid obesity due to excess calories (HCC) 06/19/2016  . Other seasonal allergic rhinitis 06/18/2016  . Prediabetes 06/18/2016  . Left lumbar radiculitis 08/22/2015  . Asthma, mild intermittent 05/02/2014  . Preventative health care 04/04/2011  . Hyperlipidemia with target LDL less than 130 12/21/2009  . Gout 12/21/2009  . Essential hypertension 12/21/2009  . [facility] 12/21/2009  . Anal fistula 12/21/2009  . COLONIC POLYPS, HX OF 12/21/2009   Past Medical History:   Diagnosis Date  . Anal fistula 12/21/2009  . Anemia   . Arthritis   . Asthma   . CLOSTRIDIUM DIFFICILE COLITIS 04/19/2010  . COLONIC POLYPS, HX OF 12/21/2009  . Complication of anesthesia    trouble keeping pt asleep.   Marland Kitchen CONJUNCTIVITIS, ACUTE 04/11/2010  . GOUT 12/21/2009  . H/O viral pericarditis   . HYPERLIPIDEMIA 12/21/2009  . HYPERTENSION 12/21/2009  . [facility] 12/21/2009  . SINUSITIS- ACUTE-NOS 04/11/2010  . Sleep apnea     Family History  Adopted: Yes  Problem Relation Age of Onset  . Cancer Neg Hx   . Diabetes Neg Hx   . Early death Neg Hx   . Hearing loss Neg Hx   . Heart disease Neg Hx   . Hyperlipidemia Neg Hx   . Hypertension Neg Hx   . Stroke Neg Hx     Past Surgical History:  Procedure Laterality Date  . anal lateral inferior sphincterotomy from c diff    . ANKLE ARTHROSCOPY Right 12/28/2020   Procedure: RIGHT ANKLE ARTHROSCOPY, DEBRIDEMENT AND EXCISION BONE SPUR;  Surgeon: Nadara Mustard, MD;  Location: [facility] OR;  Service: Orthopedics;  Laterality: Right;  . ANKLE DEBRIDEMENT Right   . right ankle ORIF     Social History   Occupational History  . Occupation: vice Management consultant: [employer]  Tobacco Use  . Smoking status: Never Smoker  . Smokeless tobacco: Never Used  Vaping Use  . Vaping Use: Never used  Substance and Sexual Activity  . Alcohol use: Yes    Alcohol/week: 4.0 standard drinks    Types: 4 Shots of liquor per week    Comment: socially  . Drug use: No  . Sexual activity: Yes    Birth control/protection: None

## 2021-01-13 ENCOUNTER — Encounter: Payer: Self-pay | Admitting: Orthopedic Surgery

## 2021-01-14 ENCOUNTER — Ambulatory Visit (INDEPENDENT_AMBULATORY_CARE_PROVIDER_SITE_OTHER): Payer: BLUE CROSS/BLUE SHIELD | Admitting: Physician Assistant

## 2021-01-14 ENCOUNTER — Encounter: Payer: Self-pay | Admitting: Orthopedic Surgery

## 2021-01-14 VITALS — Temp 99.0°F | Ht 71.0 in | Wt 319.0 lb

## 2021-01-14 DIAGNOSIS — M7751 Other enthesopathy of right foot: Secondary | ICD-10-CM

## 2021-01-14 NOTE — Progress Notes (Signed)
Office Visit Note   Patient: Rickey Hurley           Date of Birth: [birth year]           MRN: [id number] Visit Date: 01/14/2021              Requested by: Etta Grandchild, MD [address] [place],  [place] [address] PCP: Etta Grandchild, MD  Chief Complaint  Patient presents with  . Right Ankle - Wound Check    12/28/2020 right ankle arthroscopy, excision bone spur      HPI: Patient presents today he is status post right ankle arthroscopy and debridement with excision of bone spur on his right ankle.  The sutures were removed on Friday.  He was concerned because over the weekend he seemed to have a little wound dehiscence over the small anterior incision has some serous drainage.  He said it was quite a bit and ran down his leg.  He denies any purulent drainage any increased pain.  He does say it looks a little bit better today peer  Assessment & Plan: Visit Diagnoses: No diagnosis found.  Plan: Patient was seen today by Dr. Lajoyce Corners he is recommend that he go into a compression sock wear this around-the-clock follow-up in 1 week he had previously been measured for a vive 2xl compression sock Follow-Up Instructions: No follow-ups on file.   Ortho Exam  Patient is alert, oriented, no adenopathy, well-dressed, normal affect, normal respiratory effort. He has no cellulitis no foul odor no ascending cellulitis.  The anterior wound is slightly dehisced with some serous drainage.  No evidence of acute infection.  He does have a moderate amount of soft tissue swelling but he is nontender to palpation over the foot and ankle  Imaging: No results found. No images are attached to the encounter.  Labs: Lab Results  Component Value Date   HGBA1C 5.6 07/11/2019   HGBA1C 5.7 05/07/2017   HGBA1C 6.2 06/18/2016   ESRSEDRATE 8 04/19/2010   LABURIC 4.9 07/11/2019   LABURIC 5.1 05/07/2017   LABURIC 5.3 06/18/2016   LABORGA NO GROWTH 04/14/2017     Lab Results  Component Value Date    ALBUMIN 3.9 12/28/2020   ALBUMIN 4.3 07/11/2019   ALBUMIN 4.3 01/06/2018   LABURIC 4.9 07/11/2019   LABURIC 5.1 05/07/2017   LABURIC 5.3 06/18/2016    No results found for: MG Lab Results  Component Value Date   VD25OH 34.87 05/14/2020   VD25OH 24.35 (L) 07/11/2019    No results found for: PREALBUMIN CBC EXTENDED Latest Ref Rng & Units 05/14/2020 07/11/2019 01/06/2018  WBC 4.0 - 10.5 K/uL 6.8 6.7 6.6  RBC 4.22 - 5.81 Mil/uL 4.28 4.23 4.27  HGB 13.0 - 17.0 g/dL 12.8 78.6 76.7  HCT 20.9 - 52.0 % 37.9(L) 38.3(L) 38.1(L)  PLT 150.0 - 400.0 K/uL 259.0 295.0 269.0  NEUTROABS 1.4 - 7.7 K/uL 3.9 4.1 3.3  LYMPHSABS 0.7 - 4.0 K/uL 2.1 1.8 2.3     Body mass index is 44.49 kg/m.  Orders:  No orders of the defined types were placed in this encounter.  No orders of the defined types were placed in this encounter.    Procedures: No procedures performed  Clinical Data: No additional findings.  ROS:  All other systems negative, except as noted in the HPI. Review of Systems  Objective: Vital Signs: Temp 99 F (37.2 C)   Ht 5\' 11"  (1.803 m)   Wt (!) 319 lb (  144.7 kg)   BMI 44.49 kg/m   Specialty Comments:  No specialty comments available.  PMFS History: Patient Active Problem List   Diagnosis Date Noted  . Bone spur of right foot   . Post-traumatic osteoarthritis of right ankle 10/01/2020  . Chronic left shoulder pain 09/26/2020  . Chronic foot pain, right 09/26/2020  . Chronic pain of right ankle 09/26/2020  . Primary osteoarthritis of left hip 05/15/2020  . Chronic left hip pain 05/14/2020  . Chronic left-sided low back pain with left-sided sciatica 05/14/2020  . Tinnitus of both ears 07/11/2019  . Vitamin D deficiency 07/11/2019  . Pure hyperglyceridemia 05/20/2018  . Excessive daytime sleepiness 01/06/2018  . Morbid obesity due to excess calories (HCC) 06/19/2016  . Other seasonal allergic rhinitis 06/18/2016  . Prediabetes 06/18/2016  . Left lumbar  radiculitis 08/22/2015  . Asthma, mild intermittent 05/02/2014  . Preventative health care 04/04/2011  . Hyperlipidemia with target LDL less than 130 12/21/2009  . Gout 12/21/2009  . Essential hypertension 12/21/2009  . [facility] 12/21/2009  . Anal fistula 12/21/2009  . COLONIC POLYPS, HX OF 12/21/2009   Past Medical History:  Diagnosis Date  . Anal fistula 12/21/2009  . Anemia   . Arthritis   . Asthma   . CLOSTRIDIUM DIFFICILE COLITIS 04/19/2010  . COLONIC POLYPS, HX OF 12/21/2009  . Complication of anesthesia    trouble keeping pt asleep.   Marland Kitchen CONJUNCTIVITIS, ACUTE 04/11/2010  . GOUT 12/21/2009  . H/O viral pericarditis   . HYPERLIPIDEMIA 12/21/2009  . HYPERTENSION 12/21/2009  . [facility] 12/21/2009  . SINUSITIS- ACUTE-NOS 04/11/2010  . Sleep apnea     Family History  Adopted: Yes  Problem Relation Age of Onset  . Cancer Neg Hx   . Diabetes Neg Hx   . Early death Neg Hx   . Hearing loss Neg Hx   . Heart disease Neg Hx   . Hyperlipidemia Neg Hx   . Hypertension Neg Hx   . Stroke Neg Hx     Past Surgical History:  Procedure Laterality Date  . anal lateral inferior sphincterotomy from c diff    . ANKLE ARTHROSCOPY Right 12/28/2020   Procedure: RIGHT ANKLE ARTHROSCOPY, DEBRIDEMENT AND EXCISION BONE SPUR;  Surgeon: Nadara Mustard, MD;  Location: [facility] OR;  Service: Orthopedics;  Laterality: Right;  . ANKLE DEBRIDEMENT Right   . right ankle ORIF     Social History   Occupational History  . 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 Smoking status: Never Smoker  . Smokeless tobacco: Never Used  Vaping Use  . Vaping Use: Never used  Substance and Sexual Activity  . Alcohol use: Yes    Alcohol/week: 4.0 standard drinks    Types: 4 Shots of liquor per week    Comment: socially  . Drug use: No  . Sexual activity: Yes    Birth control/protection: None

## 2021-01-16 ENCOUNTER — Encounter: Payer: Self-pay | Admitting: Orthopedic Surgery

## 2021-01-17 ENCOUNTER — Encounter: Payer: Self-pay | Admitting: Internal Medicine

## 2021-01-17 ENCOUNTER — Other Ambulatory Visit: Payer: Self-pay | Admitting: Internal Medicine

## 2021-01-17 ENCOUNTER — Ambulatory Visit: Payer: BLUE CROSS/BLUE SHIELD | Admitting: Internal Medicine

## 2021-01-17 ENCOUNTER — Encounter: Payer: Self-pay | Admitting: Orthopedic Surgery

## 2021-01-17 ENCOUNTER — Other Ambulatory Visit: Payer: Self-pay

## 2021-01-17 ENCOUNTER — Ambulatory Visit (INDEPENDENT_AMBULATORY_CARE_PROVIDER_SITE_OTHER): Payer: BLUE CROSS/BLUE SHIELD | Admitting: Orthopedic Surgery

## 2021-01-17 VITALS — BP 146/88 | HR 94 | Temp 99.7°F | Resp 16 | Ht 71.0 in

## 2021-01-17 DIAGNOSIS — D510 Vitamin B12 deficiency anemia due to intrinsic factor deficiency: Secondary | ICD-10-CM

## 2021-01-17 DIAGNOSIS — G8918 Other acute postprocedural pain: Secondary | ICD-10-CM | POA: Insufficient documentation

## 2021-01-17 DIAGNOSIS — T8149XA Infection following a procedure, other surgical site, initial encounter: Secondary | ICD-10-CM | POA: Diagnosis not present

## 2021-01-17 DIAGNOSIS — M7751 Other enthesopathy of right foot: Secondary | ICD-10-CM

## 2021-01-17 DIAGNOSIS — M19171 Post-traumatic osteoarthritis, right ankle and foot: Secondary | ICD-10-CM

## 2021-01-17 MED ORDER — NUZYRA 150 MG PO TABS: 3.0000 | ORAL_TABLET | Freq: Every day | ORAL | 0 refills | Status: AC

## 2021-01-17 MED ORDER — HYDROMORPHONE HCL 2 MG PO TABS: 2.0000 mg | ORAL_TABLET | ORAL | 0 refills | Status: DC | PRN

## 2021-01-17 MED ORDER — COLCHICINE 0.6 MG PO TABS: 0.6000 mg | ORAL_TABLET | Freq: Two times a day (BID) | ORAL | 3 refills | Status: DC

## 2021-01-17 MED ORDER — SULFAMETHOXAZOLE-TRIMETHOPRIM 800-160 MG PO TABS: 1.0000 | ORAL_TABLET | Freq: Two times a day (BID) | ORAL | 0 refills | Status: DC

## 2021-01-17 MED ORDER — NUZYRA 150 MG PO TABS: 2.0000 | ORAL_TABLET | Freq: Every day | ORAL | 0 refills | Status: AC

## 2021-01-17 MED ORDER — COLCHICINE 0.6 MG PO CAPS: 0.6000 mg | ORAL_CAPSULE | Freq: Every day | ORAL | 1 refills | Status: DC | PRN

## 2021-01-17 MED ORDER — OXYCODONE-ACETAMINOPHEN 5-325 MG PO TABS: 1.0000 | ORAL_TABLET | ORAL | 0 refills | Status: DC | PRN

## 2021-01-17 MED ORDER — HYDROMORPHONE HCL 2 MG PO TABS
2.0000 mg | ORAL_TABLET | ORAL | 0 refills | Status: DC | PRN
Start: 2021-01-17 — End: 2021-01-19

## 2021-01-17 NOTE — Progress Notes (Unsigned)
Subjective:  Patient ID: Rickey Hurley, male    DOB: 03-11-1966  Age: 55 y.o. MRN: [id number]  CC: Wound Infection  This visit occurred during the SARS-CoV-2 public health emergency.  Safety protocols were in place, including screening questions prior to the visit, additional usage of staff PPE, and extensive cleaning of exam room while observing appropriate contact time as indicated for disinfecting solutions.    HPI Rickey Hurley presents for concerns about his right ankle/foot - He had arthroscopic surgery about 3 weeks ago and continues to be concerned about the area.  Over the last few days he has had worsening pain and swelling.  He saw his orthopedist earlier today and there was some concern about a wound infection.  He tells me that Bactrim was prescribed but the RX never made it to the pharmacy.  He also tells me that he has taken 2 doses of Percocet today and is still experiencing pain.  He says the foot feels swollen but he denies paresthesias.  He also denies nausea, vomiting, fever, or chills.  He has a history of C. difficile infection so he is concerned about taking an antibiotic.  Outpatient Medications Prior to Visit  Medication Sig Dispense Refill  . allopurinol (ZYLOPRIM) 300 MG tablet TAKE ONE TABLET BY MOUTH DAILY (Patient taking differently: Take 300 mg by mouth daily.) 90 tablet 0  . aspirin EC 325 MG tablet Take 1 tablet (325 mg total) by mouth daily. 30 tablet 0  . Cholecalciferol 50 MCG (2000 UT) TABS Take 1 tablet (2,000 Units total) by mouth daily. (Patient taking differently: Take 2,000 Units by mouth in the morning and at bedtime.) 90 tablet 1  . Colchicine 0.6 MG CAPS Take 0.6 mg by mouth daily as needed. 30 capsule 1  . colchicine 0.6 MG tablet Take 1 tablet (0.6 mg total) by mouth 2 (two) times daily. 60 tablet 3  . diclofenac Sodium (VOLTAREN) 1 % GEL Apply 1 application topically in the morning and at bedtime. Applied ankle    . fenofibrate 160 MG tablet TAKE ONE  TABLET BY MOUTH DAILY (Patient taking differently: Take 160 mg by mouth every evening.) 90 tablet 1  . hydrocortisone cream 1 % Apply 1 application topically 2 (two) times daily as needed for itching (redness).    . indapamide (LOZOL) 1.25 MG tablet TAKE ONE TABLET BY MOUTH DAILY (Patient taking differently: Take 1.25 mg by mouth daily.) 90 tablet 0  . irbesartan (AVAPRO) 300 MG tablet TAKE ONE TABLET BY MOUTH DAILY (Patient taking differently: Take 300 mg by mouth daily.) 90 tablet 1  . metoprolol tartrate (LOPRESSOR) 25 MG tablet TAKE ONE TABLET BY MOUTH TWICE A DAY (Patient taking differently: Take 25 mg by mouth 2 (two) times daily.) 180 tablet 0  . Multiple Vitamin (MULTIVITAMIN WITH MINERALS) TABS tablet Take 1 tablet by mouth daily. Centrum Multivitamin    . naproxen sodium (ALEVE) 220 MG tablet Take 220 mg by mouth in the morning and at bedtime.    . Omega-3 Fatty Acids (FISH OIL) 1200 MG CAPS Take 2,400 mg by mouth daily.    Marland Kitchen oxyCODONE-acetaminophen (PERCOCET) 5-325 MG tablet Take 1 tablet by mouth every 4 (four) hours as needed. 30 tablet 0  . simvastatin (ZOCOR) 40 MG tablet TAKE ONE TABLET BY MOUTH DAILY (Patient taking differently: Take 40 mg by mouth daily at 6 PM.) 90 tablet 0  . sulfamethoxazole-trimethoprim (BACTRIM DS) 800-160 MG tablet Take 1 tablet by mouth 2 (two) times daily.  20 tablet 0  . tizanidine (ZANAFLEX) 2 MG capsule TAKE ONE CAPSULE BY MOUTH THREE TIMES A DAY AS NEEDED MUSCLE SPASMS (Patient taking differently: Take 2 mg by mouth 3 (three) times daily as needed for muscle spasms.) 90 capsule 2   No facility-administered medications prior to visit.    ROS Review of Systems  Constitutional: Negative.  Negative for chills and fever.  HENT: Negative.   Eyes: Negative.   Respiratory: Negative for cough, chest tightness and shortness of breath.   Cardiovascular: Negative for chest pain, palpitations and leg swelling.  Gastrointestinal: Negative for abdominal pain and  diarrhea.  Endocrine: Negative.   Genitourinary: Negative.  Negative for difficulty urinating.  Musculoskeletal: Positive for arthralgias. Negative for myalgias.  Skin: Positive for wound. Negative for color change, pallor and rash.  Neurological: Negative.  Negative for dizziness, weakness and light-headedness.  Hematological: Negative for adenopathy. Does not bruise/bleed easily.  Psychiatric/Behavioral: Negative.     Objective:  BP (!) 146/88   Pulse 94   Temp 99.7 F (37.6 C) (Oral)   Resp 16   Ht 5\' 11"  (1.803 m)   SpO2 98%   BMI 44.49 kg/m   BP Readings from Last 3 Encounters:  01/17/21 (!) 146/88  12/28/20 128/80  09/26/20 (!) 142/86    Wt Readings from Last 3 Encounters:  01/14/21 (!) 319 lb (144.7 kg)  01/04/21 (!) 319 lb (144.7 kg)  12/28/20 (!) 319 lb (144.7 kg)    Physical Exam Vitals reviewed.  Constitutional:      General: He is not in acute distress.    Appearance: He is not ill-appearing or toxic-appearing.  HENT:     Nose: Nose normal.  Eyes:     General: No scleral icterus.    Conjunctiva/sclera: Conjunctivae normal.  Cardiovascular:     Rate and Rhythm: Normal rate and regular rhythm.     Pulses:          Dorsalis pedis pulses are 1+ on the right side and 1+ on the left side.       Posterior tibial pulses are 1+ on the right side and 1+ on the left side.     Heart sounds: No murmur heard.   Pulmonary:     Effort: Pulmonary effort is normal.     Breath sounds: No stridor. No wheezing, rhonchi or rales.  Abdominal:     General: Abdomen is protuberant.     Palpations: There is no hepatomegaly, splenomegaly or mass.  Musculoskeletal:        General: Normal range of motion.     Cervical back: Neck supple.     Right lower leg: No edema.     Left lower leg: No edema.       Feet:  Feet:     Right foot:     Skin integrity: Skin breakdown and warmth present. No blister or erythema.  Lymphadenopathy:     Cervical: No cervical adenopathy.      Lab Results  Component Value Date   WBC 6.8 05/14/2020   HGB 13.1 05/14/2020   HCT 37.9 (L) 05/14/2020   PLT 259.0 05/14/2020   GLUCOSE 133 (H) 12/28/2020   CHOL 166 07/11/2019   TRIG 132.0 07/11/2019   HDL 40.70 07/11/2019   LDLDIRECT 69.0 05/20/2018   LDLCALC 99 07/11/2019   ALT 53 (H) 12/28/2020   AST 32 12/28/2020   NA 139 12/28/2020   K 3.5 12/28/2020   CL 106 12/28/2020   CREATININE 0.89 12/28/2020  BUN 18 12/28/2020   CO2 22 12/28/2020   TSH 2.00 07/11/2019   PSA 0.50 07/11/2019   HGBA1C 5.6 07/11/2019    No results found.  Assessment & Plan:   Rickey Hurley was seen today for wound infection.  Diagnoses and all orders for this visit:  Wound infection after surgery- I think he needs broad-spectrum coverage for gram-positive's and gram-negative's.  He has a history of C. difficile infection so I would rather we use one antibiotic.  Rickey Hurley has a very low incidence of C. difficile infection so I recommended that he start taking this. -     Omadacycline Tosylate (NUZYRA) 150 MG TABS; Take 3 tablets by mouth daily for 2 days. -     WOUND CULTURE; Future -     Omadacycline Tosylate (NUZYRA) 150 MG TABS; Take 2 tablets by mouth daily for 7 days. -     WOUND CULTURE  Acute post-operative pain -     HYDROmorphone (DILAUDID) 2 MG tablet; Take 1 tablet (2 mg total) by mouth every 4 (four) hours as needed for severe pain.   I am having Rickey Hurley start on Rickey Hurley, and HYDROmorphone. I am also having him maintain his Cholecalciferol, irbesartan, allopurinol, fenofibrate, metoprolol tartrate, indapamide, simvastatin, tizanidine, multivitamin with minerals, Fish Oil, naproxen sodium, hydrocortisone cream, diclofenac Sodium, aspirin EC, oxyCODONE-acetaminophen, Colchicine, colchicine, and sulfamethoxazole-trimethoprim.  Meds ordered this encounter  Medications  . Omadacycline Tosylate (NUZYRA) 150 MG TABS    Sig: Take 3 tablets by mouth daily for 2 days.    Dispense:  6  tablet    Refill:  0  . Omadacycline Tosylate (NUZYRA) 150 MG TABS    Sig: Take 2 tablets by mouth daily for 7 days.    Dispense:  14 tablet    Refill:  0  . HYDROmorphone (DILAUDID) 2 MG tablet    Sig: Take 1 tablet (2 mg total) by mouth every 4 (four) hours as needed for severe pain.    Dispense:  35 tablet    Refill:  0     Follow-up: Return in about 1 week (around 01/24/2021).  Sanda Linger, MD

## 2021-01-17 NOTE — Patient Instructions (Signed)
Wound Infection A wound infection happens when tiny organisms (microorganisms) start to grow in a wound. A wound infection is most often caused by bacteria. Infection can cause the wound to break open or worsen. Wound infection needs treatment. If a wound infection is left untreated, complications can occur. Untreated wound infections may lead to an infection in the bloodstream (septicemia) or a bone infection (osteomyelitis). What are the causes? This condition is most often caused by bacteria growing in a wound. Other microorganisms, like yeast and fungi, can also cause wound infections. What increases the risk? The following factors may make you more likely to develop this condition:  Having a weak body defense system (immune system).  Having diabetes.  Taking steroid medicines for a long time (chronic use).  Smoking.  Being an older person.  Being overweight.  Taking chemotherapy medicines. What are the signs or symptoms? Symptoms of this condition include:  Having more redness, swelling, or pain at the wound site.  Having more blood or fluid at the wound site.  A bad smell coming from a wound or bandage (dressing).  Having a fever.  Feeling tired or fatigued.  Having warmth at or around the wound.  Having pus at the wound site. How is this diagnosed? This condition is diagnosed with a medical history and physical exam. You may also have a wound culture or blood tests or both. How is this treated? This condition is usually treated with an antibiotic medicine.  The infection should improve 24-48 hours after you start antibiotics.  After 24-48 hours, redness around the wound should stop spreading, and the wound should be less painful. Follow these instructions at home: Medicines  Take or apply over-the-counter and prescription medicines only as told by your health care provider.  If you were prescribed an antibiotic medicine, take or apply it as told by your health  care provider. Do not stop using the antibiotic even if you start to feel better. Wound care  Clean the wound each day, or as told by your health care provider. ? Wash the wound with mild soap and water. ? Rinse the wound with water to remove all soap. ? Pat the wound dry with a clean towel. Do not rub it.  Follow instructions from your health care provider about how to take care of your wound. Make sure you: ? Wash your hands with soap and water before and after you change your dressing. If soap and water are not available, use hand sanitizer. ? Change your dressing as told by your health care provider. ? Leave stitches (sutures), skin glue, or adhesive strips in place if your wound has been closed. These skin closures may need to stay in place for 2 weeks or longer. If adhesive strip edges start to loosen and curl up, you may trim the loose edges. Do not remove adhesive strips completely unless your health care provider tells you to do that. Some wounds are left open to heal on their own.  Check your wound every day for signs of infection. Watch for: ? More redness, swelling, or pain. ? More fluid or blood. ? Warmth. ? Pus or a bad smell.   General instructions  Keep the dressing dry until your health care provider says it can be removed.  Do not take baths, swim, or use a hot tub until your health care provider approves. Ask your health care provider if you may take showers. You may only be allowed to take sponge baths.  Raise (  elevate) the injured area above the level of your heart while you are sitting or lying down.  Do not scratch or pick at the wound.  Keep all follow-up visits as told by your health care provider. This is important. Contact a health care provider if:  Your pain is not controlled with medicine.  You have more redness, swelling, or pain around your wound.  You have more fluid or blood coming from your wound.  Your wound feels warm to the touch.  You have  pus coming from your wound.  You continue to notice a bad smell coming from your wound or your dressing.  Your wound that was closed breaks open. Get help right away if:  You have a red streak going away from your wound.  You have a fever. Summary  A wound infection happens when tiny organisms (microorganisms) start to grow in a wound.  This condition is usually treated with an antibiotic medicine.  Follow instructions from your health care provider about how to take care of your wound.  Contact a health care provider if your wound infection does not begin to improve in 24-48 hours, or your symptoms worsen.  Keep all follow-up visits as told by your health care provider. This is important. This information is not intended to replace advice given to you by your health care provider. Make sure you discuss any questions you have with your health care provider. Document Revised: 06/22/2018 Document Reviewed: 06/22/2018 Elsevier Patient Education  2021 [employer] Inc.  

## 2021-01-17 NOTE — Progress Notes (Signed)
Office Visit Note   Patient: Rickey Hurley           Date of Birth: Nov 27, 1965           MRN: [id number] Visit Date: 01/17/2021              Requested by: Etta Grandchild, MD [address] [place],  [place] [address] PCP: Etta Grandchild, MD  Chief Complaint  Patient presents with  . Right Ankle - Routine Post Op    12/28/20 right ankle scope and deb      HPI: Patient is 3 weeks status post right ankle arthroscopy and excision of a bony spur on the dorsum of the lateral aspect of the talus.  Patient states he has been wearing his compression socks but due to recent swelling he cannot wear them.  Patient states he has walked about a mile in the woods but now has acute pain globally around his foot.  Patient has decreased range of motion of his ankle the calf is soft nontender no evidence of DVT.  Pain is primarily generalized to the foot.  Assessment & Plan: Visit Diagnoses: No diagnosis found.  Plan: With patient's global pain around the foot his symptoms seem more consistent with gout we will start him on colchicine 1 tablet a day continue with his allopurinol there is a small amount of drainage from the ostectomy site the wound is open and draining well start him on Bactrim DS.  Patient has a allergy to fluoroquinolones has had a history of C. difficile we will hold off on stronger antibiotics.  Recommended probiotics daily.  Reevaluate on Monday.  Follow-Up Instructions: No follow-ups on file.   Ortho Exam  Patient is alert, oriented, no adenopathy, well-dressed, normal affect, normal respiratory effort. Examination patient has generalized pain and swelling around his foot there is a small amount of drainage over the dorsal incision there is no pain with range of motion of the ankle patient has global pain including the plantar fascia and has minimal pain over the open wound.  There is no ascending cellulitis.  Patient has good hair growth in his foot with good hair growth over the  toes.  Imaging: No results found. No images are attached to the encounter.  Labs: Lab Results  Component Value Date   HGBA1C 5.6 07/11/2019   HGBA1C 5.7 05/07/2017   HGBA1C 6.2 06/18/2016   ESRSEDRATE 8 04/19/2010   LABURIC 4.9 07/11/2019   LABURIC 5.1 05/07/2017   LABURIC 5.3 06/18/2016   LABORGA NO GROWTH 04/14/2017     Lab Results  Component Value Date   ALBUMIN 3.9 12/28/2020   ALBUMIN 4.3 07/11/2019   ALBUMIN 4.3 01/06/2018   LABURIC 4.9 07/11/2019   LABURIC 5.1 05/07/2017   LABURIC 5.3 06/18/2016    No results found for: MG Lab Results  Component Value Date   VD25OH 34.87 05/14/2020   VD25OH 24.35 (L) 07/11/2019    No results found for: PREALBUMIN CBC EXTENDED Latest Ref Rng & Units 05/14/2020 07/11/2019 01/06/2018  WBC 4.0 - 10.5 K/uL 6.8 6.7 6.6  RBC 4.22 - 5.81 Mil/uL 4.28 4.23 4.27  HGB 13.0 - 17.0 g/dL 37.4 82.7 07.8  HCT 67.5 - 52.0 % 37.9(L) 38.3(L) 38.1(L)  PLT 150.0 - 400.0 K/uL 259.0 295.0 269.0  NEUTROABS 1.4 - 7.7 K/uL 3.9 4.1 3.3  LYMPHSABS 0.7 - 4.0 K/uL 2.1 1.8 2.3     There is no height or weight on file to calculate BMI.  Orders:  No orders of the defined types were placed in this encounter.  Meds ordered this encounter  Medications  . oxyCODONE-acetaminophen (PERCOCET) 5-325 MG tablet    Sig: Take 1 tablet by mouth every 4 (four) hours as needed.    Dispense:  30 tablet    Refill:  0  . Colchicine 0.6 MG CAPS    Sig: Take 0.6 mg by mouth daily as needed.    Dispense:  30 capsule    Refill:  1  . sulfamethoxazole-trimethoprim (BACTRIM DS) 800-160 MG tablet    Sig: Take 1 tablet by mouth 2 (two) times daily.    Dispense:  20 tablet    Refill:  0     Procedures: No procedures performed  Clinical Data: No additional findings.  ROS:  All other systems negative, except as noted in the HPI. Review of Systems  Objective: Vital Signs: There were no vitals taken for this visit.  Specialty Comments:  No specialty comments  available.  PMFS History: Patient Active Problem List   Diagnosis Date Noted  . Bone spur of right foot   . Post-traumatic osteoarthritis of right ankle 10/01/2020  . Chronic left shoulder pain 09/26/2020  . Chronic foot pain, right 09/26/2020  . Chronic pain of right ankle 09/26/2020  . Primary osteoarthritis of left hip 05/15/2020  . Chronic left hip pain 05/14/2020  . Chronic left-sided low back pain with left-sided sciatica 05/14/2020  . Tinnitus of both ears 07/11/2019  . Vitamin D deficiency 07/11/2019  . Pure hyperglyceridemia 05/20/2018  . Excessive daytime sleepiness 01/06/2018  . Morbid obesity due to excess calories (HCC) 06/19/2016  . Other seasonal allergic rhinitis 06/18/2016  . Prediabetes 06/18/2016  . Left lumbar radiculitis 08/22/2015  . Asthma, mild intermittent 05/02/2014  . Preventative health care 04/04/2011  . Hyperlipidemia with target LDL less than 130 12/21/2009  . Gout 12/21/2009  . Essential hypertension 12/21/2009  . [facility] 12/21/2009  . Anal fistula 12/21/2009  . COLONIC POLYPS, HX OF 12/21/2009   Past Medical History:  Diagnosis Date  . Anal fistula 12/21/2009  . Anemia   . Arthritis   . Asthma   . CLOSTRIDIUM DIFFICILE COLITIS 04/19/2010  . COLONIC POLYPS, HX OF 12/21/2009  . Complication of anesthesia    trouble keeping pt asleep.   Marland Kitchen CONJUNCTIVITIS, ACUTE 04/11/2010  . GOUT 12/21/2009  . H/O viral pericarditis   . HYPERLIPIDEMIA 12/21/2009  . HYPERTENSION 12/21/2009  . [facility] 12/21/2009  . SINUSITIS- ACUTE-NOS 04/11/2010  . Sleep apnea     Family History  Adopted: Yes  Problem Relation Age of Onset  . Cancer Neg Hx   . Diabetes Neg Hx   . Early death Neg Hx   . Hearing loss Neg Hx   . Heart disease Neg Hx   . Hyperlipidemia Neg Hx   . Hypertension Neg Hx   . Stroke Neg Hx     Past Surgical History:  Procedure Laterality Date  . anal lateral inferior sphincterotomy from c diff    . ANKLE ARTHROSCOPY Right 12/28/2020   Procedure: RIGHT  ANKLE ARTHROSCOPY, DEBRIDEMENT AND EXCISION BONE SPUR;  Surgeon: Nadara Mustard, MD;  Location: [facility] OR;  Service: Orthopedics;  Laterality: Right;  . ANKLE DEBRIDEMENT Right   . right ankle ORIF     Social History   Occupational History  . Occupation: vice Management consultant: [employer]  Tobacco Use  . Smoking status: Never Smoker  . Smokeless tobacco: Never Used  Vaping Use  . Vaping Use: Never used  Substance and Sexual Activity  . Alcohol use: Yes    Alcohol/week: 4.0 standard drinks    Types: 4 Shots of liquor per week    Comment: socially  . Drug use: No  . Sexual activity: Yes    Birth control/protection: None

## 2021-01-18 ENCOUNTER — Encounter: Payer: Self-pay | Admitting: Internal Medicine

## 2021-01-19 ENCOUNTER — Other Ambulatory Visit: Payer: Self-pay | Admitting: Internal Medicine

## 2021-01-19 DIAGNOSIS — M10071 Idiopathic gout, right ankle and foot: Secondary | ICD-10-CM

## 2021-01-19 DIAGNOSIS — G8918 Other acute postprocedural pain: Secondary | ICD-10-CM

## 2021-01-19 MED ORDER — METHYLPREDNISOLONE 4 MG PO TBPK: ORAL_TABLET | ORAL | 0 refills | Status: AC

## 2021-01-19 MED ORDER — OXYCODONE-ACETAMINOPHEN 10-325 MG PO TABS: 1.0000 | ORAL_TABLET | Freq: Three times a day (TID) | ORAL | 0 refills | Status: DC | PRN

## 2021-01-20 LAB — WOUND CULTURE

## 2021-01-21 ENCOUNTER — Ambulatory Visit (INDEPENDENT_AMBULATORY_CARE_PROVIDER_SITE_OTHER): Payer: BLUE CROSS/BLUE SHIELD | Admitting: Orthopedic Surgery

## 2021-01-21 ENCOUNTER — Encounter: Payer: Self-pay | Admitting: Orthopedic Surgery

## 2021-01-21 ENCOUNTER — Encounter: Payer: Self-pay | Admitting: Internal Medicine

## 2021-01-21 VITALS — Ht 71.0 in | Wt 319.0 lb

## 2021-01-21 DIAGNOSIS — M19171 Post-traumatic osteoarthritis, right ankle and foot: Secondary | ICD-10-CM

## 2021-01-21 DIAGNOSIS — M1A0710 Idiopathic chronic gout, right ankle and foot, without tophus (tophi): Secondary | ICD-10-CM

## 2021-01-21 DIAGNOSIS — M10071 Idiopathic gout, right ankle and foot: Secondary | ICD-10-CM

## 2021-01-21 DIAGNOSIS — M7751 Other enthesopathy of right foot: Secondary | ICD-10-CM

## 2021-01-21 DIAGNOSIS — G8918 Other acute postprocedural pain: Secondary | ICD-10-CM

## 2021-01-21 MED ORDER — OXYCODONE-ACETAMINOPHEN 10-325 MG PO TABS: 1.0000 | ORAL_TABLET | Freq: Three times a day (TID) | ORAL | 0 refills | Status: AC | PRN

## 2021-01-21 NOTE — Progress Notes (Signed)
Office Visit Note   Patient: Rickey Hurley           Date of Birth: October 21, 1966           MRN: [id number] Visit Date: 01/21/2021              Requested by: Etta Grandchild, MD [address] [place],  [place] [address] PCP: Etta Grandchild, MD  Chief Complaint  Patient presents with  . Right Ankle - Follow-up    Right ankle scope and debridement 12/28/2020      HPI: Patient is a 55 year old gentleman who presents me is 3 weeks status post right ankle arthroscopy debridement for traumatic arthritis as well as excision of a bony spur dorsal laterally of the right foot.  Postoperatively patient had a flareup of his gout he was started on colchicine twice a day and continuing his allopurinol 300 mg in the morning.  Patient was on doxycycline we switched him to Bactrim DS there was the delay in the prescription being ready at the pharmacy in the interim patient did see his primary care physician and the Bactrim DS was canceled and patient was placed on Nuzyra which is also and the tetracycline family.  Patient states that the redness swelling and drainage has improved since changing to the new 0.  Patient was also started on a Medrol Dosepak and I recommend that he not use the steroid due to the risk of flaring up his infection also recommended against a steroid injection his ankle for the same concern.  Patient states that he has had ACTH in the past from a rheumatologist.  Assessment & Plan: Visit Diagnoses:  1. Post-traumatic osteoarthritis, right ankle and foot   2. Bone spur of right foot   3. Idiopathic chronic gout of right ankle without tophus   4. Acute post-operative pain   5. Acute idiopathic gout of right foot     Plan: A refill prescription was called in for the 10 mg Percocet tablets recommended starting range of motion he shows improvement in the ankle and range of motion of the great toe second and third toe.  Also recommended stopping his diuretic recommended topical Voltaren  gel heating pad to assist with improving the range of motion of the toes and ankle and ice after his therapy patient states he would like to work on therapy on his own at home and not go to a formalized physical therapy.  Recommend trial of the compression socks and patient states that he is concerned about discomfort with using a compression sock he will hold on this until there is improvement in his symptoms in the meantime we will use an Ace wrap and dry gauze dressing.  Once patient's symptoms are stabilized we will need to repeat a uric acid.  Do not feel a uric acid at this time would provide [place] good clinical information.  Patient was given a note anticipating out of work for 3 additional weeks and was also recommended and a prescription provided for a wheelchair to assist him with ambulation at this time.  Follow-Up Instructions: Return in about 1 week (around 01/28/2021).   Ortho Exam  Patient is alert, oriented, no adenopathy, well-dressed, normal affect, normal respiratory effort. Examination patient has swelling in the right foot he has a little bit of motion of the ankle and has improved range of motion of the great toe second toe and third toes.  The dorsal incision from the bone spur excision shows  less drainage there is no cellulitis the wound is flat no clinical signs of infection at this time there is clear serosanguineous drainage.  There is no redness or cellulitis around the ankle joint or the toes.  Imaging: No results found. No images are attached to the encounter.  Labs: Lab Results  Component Value Date   HGBA1C 5.6 07/11/2019   HGBA1C 5.7 05/07/2017   HGBA1C 6.2 06/18/2016   ESRSEDRATE 8 04/19/2010   LABURIC 4.9 07/11/2019   LABURIC 5.1 05/07/2017   LABURIC 5.3 06/18/2016   LABORGA NO GROWTH 04/14/2017     Lab Results  Component Value Date   ALBUMIN 3.9 12/28/2020   ALBUMIN 4.3 07/11/2019   ALBUMIN 4.3 01/06/2018   LABURIC 4.9 07/11/2019   LABURIC 5.1  05/07/2017   LABURIC 5.3 06/18/2016    No results found for: MG Lab Results  Component Value Date   VD25OH 34.87 05/14/2020   VD25OH 24.35 (L) 07/11/2019    No results found for: PREALBUMIN CBC EXTENDED Latest Ref Rng & Units 05/14/2020 07/11/2019 01/06/2018  WBC 4.0 - 10.5 K/uL 6.8 6.7 6.6  RBC 4.22 - 5.81 Mil/uL 4.28 4.23 4.27  HGB 13.0 - 17.0 g/dL 16.1 09.6 04.5  HCT 40.9 - 52.0 % 37.9(L) 38.3(L) 38.1(L)  PLT 150.0 - 400.0 K/uL 259.0 295.0 269.0  NEUTROABS 1.4 - 7.7 K/uL 3.9 4.1 3.3  LYMPHSABS 0.7 - 4.0 K/uL 2.1 1.8 2.3     Body mass index is 44.49 kg/m.  Orders:  No orders of the defined types were placed in this encounter.  Meds ordered this encounter  Medications  . oxyCODONE-acetaminophen (PERCOCET) 10-325 MG tablet    Sig: Take 1 tablet by mouth every 8 (eight) hours as needed for up to 7 days for pain.    Dispense:  20 tablet    Refill:  0     Procedures: No procedures performed  Clinical Data: No additional findings.  ROS:  All other systems negative, except as noted in the HPI. Review of Systems  Objective: Vital Signs: Ht 5\' 11"  (1.803 m)   Wt (!) 319 lb (144.7 kg)   BMI 44.49 kg/m   Specialty Comments:  No specialty comments available.  PMFS History: Patient Active Problem List   Diagnosis Date Noted  . Wound infection after surgery 01/17/2021  . Acute post-operative pain 01/17/2021  . Bone spur of right foot   . Post-traumatic osteoarthritis of right ankle 10/01/2020  . Primary osteoarthritis of left hip 05/15/2020  . Chronic left-sided low back pain with left-sided sciatica 05/14/2020  . Tinnitus of both ears 07/11/2019  . Vitamin D deficiency 07/11/2019  . Pure hyperglyceridemia 05/20/2018  . Excessive daytime sleepiness 01/06/2018  . Morbid obesity due to excess calories (HCC) 06/19/2016  . Other seasonal allergic rhinitis 06/18/2016  . Prediabetes 06/18/2016  . Left lumbar radiculitis 08/22/2015  . Asthma, mild intermittent  05/02/2014  . Preventative health care 04/04/2011  . Hyperlipidemia with target LDL less than 130 12/21/2009  . Gout 12/21/2009  . Essential hypertension 12/21/2009  . [facility] 12/21/2009  . Anal fistula 12/21/2009  . COLONIC POLYPS, HX OF 12/21/2009   Past Medical History:  Diagnosis Date  . Anal fistula 12/21/2009  . Anemia   . Arthritis   . Asthma   . CLOSTRIDIUM DIFFICILE COLITIS 04/19/2010  . COLONIC POLYPS, HX OF 12/21/2009  . Complication of anesthesia    trouble keeping pt asleep.   12/23/2009 CONJUNCTIVITIS, ACUTE 04/11/2010  . GOUT 12/21/2009  .  H/O viral pericarditis   . HYPERLIPIDEMIA 12/21/2009  . HYPERTENSION 12/21/2009  . [facility] 12/21/2009  . SINUSITIS- ACUTE-NOS 04/11/2010  . Sleep apnea     Family History  Adopted: Yes  Problem Relation Age of Onset  . Cancer Neg Hx   . Diabetes Neg Hx   . Early death Neg Hx   . Hearing loss Neg Hx   . Heart disease Neg Hx   . Hyperlipidemia Neg Hx   . Hypertension Neg Hx   . Stroke Neg Hx     Past Surgical History:  Procedure Laterality Date  . anal lateral inferior sphincterotomy from c diff    . ANKLE ARTHROSCOPY Right 12/28/2020   Procedure: RIGHT ANKLE ARTHROSCOPY, DEBRIDEMENT AND EXCISION BONE SPUR;  Surgeon: Nadara Mustard, MD;  Location: [facility] OR;  Service: Orthopedics;  Laterality: Right;  . ANKLE DEBRIDEMENT Right   . right ankle ORIF     Social History   Occupational History  . Occupation: vice Management consultant: [employer]  Tobacco Use  . Smoking status: Never Smoker  . Smokeless tobacco: Never Used  Vaping Use  . Vaping Use: Never used  Substance and Sexual Activity  . Alcohol use: Yes    Alcohol/week: 4.0 standard drinks    Types: 4 Shots of liquor per week    Comment: socially  . Drug use: No  . Sexual activity: Yes    Birth control/protection: None

## 2021-01-22 ENCOUNTER — Ambulatory Visit: Payer: BLUE CROSS/BLUE SHIELD | Admitting: Orthopedic Surgery

## 2021-01-24 ENCOUNTER — Other Ambulatory Visit: Payer: Self-pay | Admitting: Internal Medicine

## 2021-01-27 ENCOUNTER — Encounter: Payer: Self-pay | Admitting: Internal Medicine

## 2021-01-29 ENCOUNTER — Ambulatory Visit (INDEPENDENT_AMBULATORY_CARE_PROVIDER_SITE_OTHER): Payer: BLUE CROSS/BLUE SHIELD | Admitting: Physician Assistant

## 2021-01-29 ENCOUNTER — Encounter: Payer: Self-pay | Admitting: Orthopedic Surgery

## 2021-01-29 ENCOUNTER — Ambulatory Visit (INDEPENDENT_AMBULATORY_CARE_PROVIDER_SITE_OTHER): Payer: BLUE CROSS/BLUE SHIELD

## 2021-01-29 DIAGNOSIS — M25571 Pain in right ankle and joints of right foot: Secondary | ICD-10-CM

## 2021-01-29 NOTE — Progress Notes (Signed)
Office Visit Note   Patient: Rickey Hurley           Date of Birth: 10-04-1966           MRN: [id number] Visit Date: 01/29/2021              Requested by: Etta Grandchild, MD [address] [place],  [place] [address] PCP: Etta Grandchild, MD  Chief Complaint  Patient presents with  . Right Ankle - Routine Post Op    12/28/20 right ankle scope and debridement       HPI: Patient presents today he is 6 weeks status post right ankle arthroscopy and debridement with open excision of an osteophyte.  He did have complications with cellulitis and some drainage from the wound.  He was placed on antibiotics.  He finished those on Saturday.  He says his pain has decreased significantly as has his swelling in the discoloration in his ankle.  He has been able to ambulate with his crutches further distances.  He still has pain on the medial side of his ankle.  This is accentuated with flexion.  He is using topical Voltaren as well as Aleve.  Assessment & Plan: Visit Diagnoses:  1. Pain in right ankle and joints of right foot     Plan: Continue advancing activities as tolerated.  We will follow-up in 1 week.  If he has any concerns or return of painful symptoms that he had last week he will contact us immediately  Follow-Up Instructions: No follow-ups on file.   Ortho Exam  Patient is alert, oriented, no adenopathy, well-dressed, normal affect, normal respiratory effort. Examination demonstrates well-healed surgical portals.  Small incision on his ankle has no drainage today.  Just ever so slightly open but no surrounding cellulitis no ascending cellulitis mild to moderate soft tissue swelling  Imaging: XR Ankle 2 Views Right  Result Date: 01/29/2021 X-rays of his right ankle taken today demonstrate well-maintained alignment he has obvious degenerative changes of the ankle joint.  Findings consistent with previous surgery.  Cannot appreciate any acute osseous changes with x-rays taken in  November  No images are attached to the encounter.  Labs: Lab Results  Component Value Date   HGBA1C 5.6 07/11/2019   HGBA1C 5.7 05/07/2017   HGBA1C 6.2 06/18/2016   ESRSEDRATE 8 04/19/2010   LABURIC 4.9 07/11/2019   LABURIC 5.1 05/07/2017   LABURIC 5.3 06/18/2016   LABORGA NO GROWTH 04/14/2017     Lab Results  Component Value Date   ALBUMIN 3.9 12/28/2020   ALBUMIN 4.3 07/11/2019   ALBUMIN 4.3 01/06/2018   LABURIC 4.9 07/11/2019   LABURIC 5.1 05/07/2017   LABURIC 5.3 06/18/2016    No results found for: MG Lab Results  Component Value Date   VD25OH 34.87 05/14/2020   VD25OH 24.35 (L) 07/11/2019    No results found for: PREALBUMIN CBC EXTENDED Latest Ref Rng & Units 05/14/2020 07/11/2019 01/06/2018  WBC 4.0 - 10.5 K/uL 6.8 6.7 6.6  RBC 4.22 - 5.81 Mil/uL 4.28 4.23 4.27  HGB 13.0 - 17.0 g/dL 24.2 35.3 61.4  HCT 43.1 - 52.0 % 37.9(L) 38.3(L) 38.1(L)  PLT 150.0 - 400.0 K/uL 259.0 295.0 269.0  NEUTROABS 1.4 - 7.7 K/uL 3.9 4.1 3.3  LYMPHSABS 0.7 - 4.0 K/uL 2.1 1.8 2.3     There is no height or weight on file to calculate BMI.  Orders:  Orders Placed This Encounter  Procedures  . XR Ankle 2 Views Right  No orders of the defined types were placed in this encounter.    Procedures: No procedures performed  Clinical Data: No additional findings.  ROS:  All other systems negative, except as noted in the HPI. Review of Systems  Objective: Vital Signs: There were no vitals taken for this visit.  Specialty Comments:  No specialty comments available.  PMFS History: Patient Active Problem List   Diagnosis Date Noted  . Wound infection after surgery 01/17/2021  . Acute post-operative pain 01/17/2021  . Bone spur of right foot   . Post-traumatic osteoarthritis of right ankle 10/01/2020  . Primary osteoarthritis of left hip 05/15/2020  . Chronic left-sided low back pain with left-sided sciatica 05/14/2020  . Tinnitus of both ears 07/11/2019  . Vitamin D  deficiency 07/11/2019  . Pure hyperglyceridemia 05/20/2018  . Excessive daytime sleepiness 01/06/2018  . Morbid obesity due to excess calories (HCC) 06/19/2016  . Other seasonal allergic rhinitis 06/18/2016  . Prediabetes 06/18/2016  . Left lumbar radiculitis 08/22/2015  . Asthma, mild intermittent 05/02/2014  . Preventative health care 04/04/2011  . Hyperlipidemia with target LDL less than 130 12/21/2009  . Gout 12/21/2009  . Essential hypertension 12/21/2009  . [facility] 12/21/2009  . Anal fistula 12/21/2009  . COLONIC POLYPS, HX OF 12/21/2009   Past Medical History:  Diagnosis Date  . Anal fistula 12/21/2009  . Anemia   . Arthritis   . Asthma   . CLOSTRIDIUM DIFFICILE COLITIS 04/19/2010  . COLONIC POLYPS, HX OF 12/21/2009  . Complication of anesthesia    trouble keeping pt asleep.   [facility] CONJUNCTIVITIS, ACUTE 04/11/2010  . GOUT 12/21/2009  . H/O viral pericarditis   . HYPERLIPIDEMIA 12/21/2009  . HYPERTENSION 12/21/2009  . [facility] 12/21/2009  . SINUSITIS- ACUTE-NOS 04/11/2010  . Sleep apnea     Family History  Adopted: Yes  Problem Relation Age of Onset  . Cancer Neg Hx   . Diabetes Neg Hx   . Early death Neg Hx   . Hearing loss Neg Hx   . Heart disease Neg Hx   . Hyperlipidemia Neg Hx   . Hypertension Neg Hx   . Stroke Neg Hx     Past Surgical History:  Procedure Laterality Date  . anal lateral inferior sphincterotomy from c diff    . ANKLE ARTHROSCOPY Right 12/28/2020   Procedure: RIGHT ANKLE ARTHROSCOPY, DEBRIDEMENT AND EXCISION BONE SPUR;  Surgeon: Nadara Mustard, MD;  Location: [facility] OR;  Service: Orthopedics;  Laterality: Right;  . ANKLE DEBRIDEMENT Right   . right ankle ORIF     Social History   Occupational History  . 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 Smoking status: Never Smoker  . Smokeless tobacco: Never Used  Vaping Use  . Vaping Use: Never used  Substance and Sexual Activity  . Alcohol use: Yes    Alcohol/week: 4.0 standard  drinks    Types: 4 Shots of liquor per week    Comment: socially  . Drug use: No  . Sexual activity: Yes    Birth control/protection: None

## 2021-02-06 ENCOUNTER — Ambulatory Visit (INDEPENDENT_AMBULATORY_CARE_PROVIDER_SITE_OTHER): Payer: BLUE CROSS/BLUE SHIELD | Admitting: Physician Assistant

## 2021-02-06 ENCOUNTER — Encounter: Payer: Self-pay | Admitting: Physician Assistant

## 2021-02-06 ENCOUNTER — Other Ambulatory Visit: Payer: Self-pay

## 2021-02-06 DIAGNOSIS — M25571 Pain in right ankle and joints of right foot: Secondary | ICD-10-CM

## 2021-02-06 DIAGNOSIS — M19171 Post-traumatic osteoarthritis, right ankle and foot: Secondary | ICD-10-CM | POA: Diagnosis not present

## 2021-02-06 LAB — CBC WITH DIFFERENTIAL/PLATELET
Absolute Monocytes: 673 cells/uL (ref 200–950)
Basophils Absolute: 104 cells/uL (ref 0–200)
Basophils Relative: 1.4 %
Eosinophils Absolute: 178 cells/uL (ref 15–500)
Eosinophils Relative: 2.4 %
HCT: 37.8 % — ABNORMAL LOW (ref 38.5–50.0)
Hemoglobin: 12.7 g/dL — ABNORMAL LOW (ref 13.2–17.1)
Lymphs Abs: 2523 cells/uL (ref 850–3900)
MCH: 30.5 pg (ref 27.0–33.0)
MCHC: 33.6 g/dL (ref 32.0–36.0)
MCV: 90.6 fL (ref 80.0–100.0)
MPV: 9.9 fL (ref 7.5–12.5)
Monocytes Relative: 9.1 %
Neutro Abs: 3922 cells/uL (ref 1500–7800)
Neutrophils Relative %: 53 %
Platelets: 388 10*3/uL (ref 140–400)
RBC: 4.17 10*6/uL — ABNORMAL LOW (ref 4.20–5.80)
RDW: 13.1 % (ref 11.0–15.0)
Total Lymphocyte: 34.1 %
WBC: 7.4 10*3/uL (ref 3.8–10.8)

## 2021-02-06 LAB — URIC ACID: Uric Acid, Serum: 4.2 mg/dL (ref 4.0–8.0)

## 2021-02-06 MED ORDER — SULFAMETHOXAZOLE-TRIMETHOPRIM 800-160 MG PO TABS: 1.0000 | ORAL_TABLET | Freq: Two times a day (BID) | ORAL | 0 refills | Status: DC

## 2021-02-06 MED ORDER — MELOXICAM 15 MG PO TABS: 15.0000 mg | ORAL_TABLET | Freq: Every day | ORAL | 2 refills | Status: DC

## 2021-02-06 NOTE — Progress Notes (Signed)
Office Visit Note   Patient: Rickey Hurley           Date of Birth: 09-Feb-1966           MRN: [id number] Visit Date: 02/06/2021              Requested by: Etta Grandchild, MD [address] [place],  [place] [address] PCP: Etta Grandchild, MD  No chief complaint on file.     HPI: Patient presents today for follow-up on his right ankle.  He is status post right ankle arthroscopy and excision of osteophyte.  He did have some dehiscence of the open area where the osteophyte was removed and was placed on antibiotics.  He has been trying to progress his activity and wearing a compression sock.  He comes in and out of the boot.  His biggest concern is of still some significant pain in catching on the medial side of his ankle.  Ankle x-rays last week looked okay.  He also is wondering if this could be a gouty flare which started this in the first place.  He has not had a uric acid drawn in quite a while.  He is using Aleve for anti-inflammatories and topical Voltaren.  He denies any fever chills  Assessment & Plan: Visit Diagnoses:  1. Pain in right ankle and joints of right foot   2. Post-traumatic osteoarthritis, right ankle and foot     Plan: We will plan on 1 week of Bactrim.  We will also draw CBC and uric acid today.  Recommend meloxicam as well.  Should follow-up in 1 week.  Continue working on progressing mobilization  Follow-Up Instructions: No follow-ups on file.   Ortho Exam  Patient is alert, oriented, no adenopathy, well-dressed, normal affect, normal respiratory effort. Examination demonstrates overall well-healed surgical portals.  The one incision on the lateral side has a spot of drainage he does have some tenderness over the medial ankle no fluctuance.  No ascending cellulitis  Imaging: No results found. No images are attached to the encounter.  Labs: Lab Results  Component Value Date   HGBA1C 5.6 07/11/2019   HGBA1C 5.7 05/07/2017   HGBA1C 6.2 06/18/2016    ESRSEDRATE 8 04/19/2010   LABURIC 4.9 07/11/2019   LABURIC 5.1 05/07/2017   LABURIC 5.3 06/18/2016   LABORGA NO GROWTH 04/14/2017     Lab Results  Component Value Date   ALBUMIN 3.9 12/28/2020   ALBUMIN 4.3 07/11/2019   ALBUMIN 4.3 01/06/2018   LABURIC 4.9 07/11/2019   LABURIC 5.1 05/07/2017   LABURIC 5.3 06/18/2016    No results found for: MG Lab Results  Component Value Date   VD25OH 34.87 05/14/2020   VD25OH 24.35 (L) 07/11/2019    No results found for: PREALBUMIN CBC EXTENDED Latest Ref Rng & Units 05/14/2020 07/11/2019 01/06/2018  WBC 4.0 - 10.5 K/uL 6.8 6.7 6.6  RBC 4.22 - 5.81 Mil/uL 4.28 4.23 4.27  HGB 13.0 - 17.0 g/dL 30.1 60.1 09.3  HCT 23.5 - 52.0 % 37.9(L) 38.3(L) 38.1(L)  PLT 150.0 - 400.0 K/uL 259.0 295.0 269.0  NEUTROABS 1.4 - 7.7 K/uL 3.9 4.1 3.3  LYMPHSABS 0.7 - 4.0 K/uL 2.1 1.8 2.3     There is no height or weight on file to calculate BMI.  Orders:  Orders Placed This Encounter  Procedures  . Uric acid  . CBC with Differential   No orders of the defined types were placed in this encounter.  Procedures: No procedures performed  Clinical Data: No additional findings.  ROS:  All other systems negative, except as noted in the HPI. Review of Systems  Objective: Vital Signs: There were no vitals taken for this visit.  Specialty Comments:  No specialty comments available.  PMFS History: Patient Active Problem List   Diagnosis Date Noted  . Wound infection after surgery 01/17/2021  . Acute post-operative pain 01/17/2021  . Bone spur of right foot   . Post-traumatic osteoarthritis of right ankle 10/01/2020  . Primary osteoarthritis of left hip 05/15/2020  . Chronic left-sided low back pain with left-sided sciatica 05/14/2020  . Tinnitus of both ears 07/11/2019  . Vitamin D deficiency 07/11/2019  . Pure hyperglyceridemia 05/20/2018  . Excessive daytime sleepiness 01/06/2018  . Morbid obesity due to excess calories (HCC) 06/19/2016   . Other seasonal allergic rhinitis 06/18/2016  . Prediabetes 06/18/2016  . Left lumbar radiculitis 08/22/2015  . Asthma, mild intermittent 05/02/2014  . Preventative health care 04/04/2011  . Hyperlipidemia with target LDL less than 130 12/21/2009  . Gout 12/21/2009  . Essential hypertension 12/21/2009  . [facility] 12/21/2009  . Anal fistula 12/21/2009  . COLONIC POLYPS, HX OF 12/21/2009   Past Medical History:  Diagnosis Date  . Anal fistula 12/21/2009  . Anemia   . Arthritis   . Asthma   . CLOSTRIDIUM DIFFICILE COLITIS 04/19/2010  . COLONIC POLYPS, HX OF 12/21/2009  . Complication of anesthesia    trouble keeping pt asleep.   [facility] CONJUNCTIVITIS, ACUTE 04/11/2010  . GOUT 12/21/2009  . H/O viral pericarditis   . HYPERLIPIDEMIA 12/21/2009  . HYPERTENSION 12/21/2009  . [facility] 12/21/2009  . SINUSITIS- ACUTE-NOS 04/11/2010  . Sleep apnea     Family History  Adopted: Yes  Problem Relation Age of Onset  . Cancer Neg Hx   . Diabetes Neg Hx   . Early death Neg Hx   . Hearing loss Neg Hx   . Heart disease Neg Hx   . Hyperlipidemia Neg Hx   . Hypertension Neg Hx   . Stroke Neg Hx     Past Surgical History:  Procedure Laterality Date  . anal lateral inferior sphincterotomy from c diff    . ANKLE ARTHROSCOPY Right 12/28/2020   Procedure: RIGHT ANKLE ARTHROSCOPY, DEBRIDEMENT AND EXCISION BONE SPUR;  Surgeon: Nadara Mustard, MD;  Location: [facility] OR;  Service: Orthopedics;  Laterality: Right;  . ANKLE DEBRIDEMENT Right   . right ankle ORIF     Social History   Occupational History  . 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 Smoking status: Never Smoker  . Smokeless tobacco: Never Used  Vaping Use  . Vaping Use: Never used  Substance and Sexual Activity  . Alcohol use: Yes    Alcohol/week: 4.0 standard drinks    Types: 4 Shots of liquor per week    Comment: socially  . Drug use: No  . Sexual activity: Yes    Birth control/protection: None

## 2021-02-07 ENCOUNTER — Other Ambulatory Visit: Payer: Self-pay | Admitting: Internal Medicine

## 2021-02-07 DIAGNOSIS — D539 Nutritional anemia, unspecified: Secondary | ICD-10-CM

## 2021-02-08 ENCOUNTER — Other Ambulatory Visit (INDEPENDENT_AMBULATORY_CARE_PROVIDER_SITE_OTHER): Payer: BLUE CROSS/BLUE SHIELD

## 2021-02-08 DIAGNOSIS — D539 Nutritional anemia, unspecified: Secondary | ICD-10-CM

## 2021-02-08 LAB — VITAMIN B12: Vitamin B-12: 230 pg/mL (ref 211–911)

## 2021-02-08 LAB — FERRITIN: Ferritin: 123.2 ng/mL (ref 22.0–322.0)

## 2021-02-08 LAB — IRON: Iron: 79 ug/dL (ref 42–165)

## 2021-02-08 LAB — FOLATE: Folate: >23.6 ng/mL (ref >5.9)

## 2021-02-11 ENCOUNTER — Ambulatory Visit: Payer: BLUE CROSS/BLUE SHIELD | Admitting: Orthopedic Surgery

## 2021-02-12 ENCOUNTER — Other Ambulatory Visit: Payer: Self-pay | Admitting: Internal Medicine

## 2021-02-12 DIAGNOSIS — I10 Essential (primary) hypertension: Secondary | ICD-10-CM

## 2021-02-12 DIAGNOSIS — M1A09X0 Idiopathic chronic gout, multiple sites, without tophus (tophi): Secondary | ICD-10-CM

## 2021-02-12 DIAGNOSIS — E785 Hyperlipidemia, unspecified: Secondary | ICD-10-CM

## 2021-02-12 DIAGNOSIS — I491 Atrial premature depolarization: Secondary | ICD-10-CM

## 2021-02-13 ENCOUNTER — Other Ambulatory Visit: Payer: Self-pay | Admitting: Internal Medicine

## 2021-02-13 ENCOUNTER — Encounter: Payer: Self-pay | Admitting: Physician Assistant

## 2021-02-13 ENCOUNTER — Ambulatory Visit (INDEPENDENT_AMBULATORY_CARE_PROVIDER_SITE_OTHER): Payer: BLUE CROSS/BLUE SHIELD | Admitting: Physician Assistant

## 2021-02-13 DIAGNOSIS — D510 Vitamin B12 deficiency anemia due to intrinsic factor deficiency: Secondary | ICD-10-CM | POA: Insufficient documentation

## 2021-02-13 DIAGNOSIS — M7751 Other enthesopathy of right foot: Secondary | ICD-10-CM

## 2021-02-13 LAB — RETICULOCYTES
ABS Retic: 87120 cells/uL (ref 25000–9000)
Retic Ct Pct: 2.2 %

## 2021-02-13 LAB — ZINC: Zinc: 74 ug/dL (ref 60–130)

## 2021-02-13 LAB — LACTATE DEHYDROGENASE: LDH: 133 U/L (ref 120–250)

## 2021-02-13 LAB — HAPTOGLOBIN: Haptoglobin: 183 mg/dL (ref 43–212)

## 2021-02-13 LAB — VITAMIN B1: Vitamin B1 (Thiamine): 17 nmol/L (ref 8–30)

## 2021-02-13 MED ORDER — SULFAMETHOXAZOLE-TRIMETHOPRIM 800-160 MG PO TABS: 1.0000 | ORAL_TABLET | Freq: Two times a day (BID) | ORAL | 0 refills | Status: DC

## 2021-02-13 NOTE — Addendum Note (Signed)
Addended by: Etta Grandchild on: 02/13/2021 03:05 PM   Modules accepted: Orders

## 2021-02-13 NOTE — Progress Notes (Signed)
Office Visit Note   Patient: Rickey Hurley           Date of Birth: [birth year]           MRN: [id number] Visit Date: 02/13/2021              Requested by: Etta Grandchild, MD [address] [place],  [place] [address] PCP: Etta Grandchild, MD  Chief Complaint  Patient presents with  . Right Ankle - Follow-up      HPI: Patient presents today for follow-up on his right ankle.  He is 6 weeks status post right ankle arthroscopy.  He has struggled with intermittent swelling and pain.  At his last visit a uric acid was drawn which was normal.  CBC also did not demonstrate any elevated white count.  That being said he states when he began taking Bactrim again he noticed a significant improvement within 24 hours.  He denies any fever chills  Assessment & Plan: Visit Diagnoses: No diagnosis found.  Plan: We will continue Bactrim for 1 more week should follow-up with Dr. Lajoyce Corners at that time.  Should continue to mobilize would recommend a stiff soled shoe and continued compression.  Follow-Up Instructions: No follow-ups on file.   Ortho Exam  Patient is alert, oriented, no adenopathy, well-dressed, normal affect, normal respiratory effort. Examination of his foot demonstrates well-healed surgical portals area where osteophyte removed has just a scant of serous drainage.  He does have moderate soft tissue swelling.  No ascending cellulitis erythema overall improved from 1 week ago  Imaging: No results found. No images are attached to the encounter.  Labs: Lab Results  Component Value Date   HGBA1C 5.6 07/11/2019   HGBA1C 5.7 05/07/2017   HGBA1C 6.2 06/18/2016   ESRSEDRATE 8 04/19/2010   LABURIC 4.2 02/06/2021   LABURIC 4.9 07/11/2019   LABURIC 5.1 05/07/2017   LABORGA NO GROWTH 04/14/2017     Lab Results  Component Value Date   ALBUMIN 3.9 12/28/2020   ALBUMIN 4.3 07/11/2019   ALBUMIN 4.3 01/06/2018    No results found for: MG Lab Results  Component Value Date   VD25OH  34.87 05/14/2020   VD25OH 24.35 (L) 07/11/2019    No results found for: PREALBUMIN CBC EXTENDED Latest Ref Rng & Units 02/06/2021 05/14/2020 07/11/2019  WBC 3.8 - 10.8 Thousand/uL 7.4 6.8 6.7  RBC 4.20 - 5.80 Million/uL 4.17(L) 4.28 4.23  HGB 13.2 - 17.1 g/dL 12.7(L) 13.1 13.0  HCT 38.5 - 50.0 % 37.8(L) 37.9(L) 38.3(L)  PLT 140 - 400 Thousand/uL 388 259.0 295.0  NEUTROABS 1,500 - 7,800 cells/uL 3,922 3.9 4.1  LYMPHSABS 850 - 3,900 cells/uL 2,523 2.1 1.8     There is no height or weight on file to calculate BMI.  Orders:  No orders of the defined types were placed in this encounter.  No orders of the defined types were placed in this encounter.    Procedures: No procedures performed  Clinical Data: No additional findings.  ROS:  All other systems negative, except as noted in the HPI. Review of Systems  Objective: Vital Signs: There were no vitals taken for this visit.  Specialty Comments:  No specialty comments available.  PMFS History: Patient Active Problem List   Diagnosis Date Noted  . Wound infection after surgery 01/17/2021  . Acute post-operative pain 01/17/2021  . Bone spur of right foot   . Post-traumatic osteoarthritis of right ankle 10/01/2020  . Primary osteoarthritis of left hip  05/15/2020  . Chronic left-sided low back pain with left-sided sciatica 05/14/2020  . Tinnitus of both ears 07/11/2019  . Vitamin D deficiency 07/11/2019  . Pure hyperglyceridemia 05/20/2018  . Excessive daytime sleepiness 01/06/2018  . Morbid obesity due to excess calories (HCC) 06/19/2016  . Other seasonal allergic rhinitis 06/18/2016  . Prediabetes 06/18/2016  . Left lumbar radiculitis 08/22/2015  . Deficiency anemia 05/02/2014  . Asthma, mild intermittent 05/02/2014  . Preventative health care 04/04/2011  . Hyperlipidemia with target LDL less than 130 12/21/2009  . Gout 12/21/2009  . Essential hypertension 12/21/2009  . [facility] 12/21/2009  . Anal fistula 12/21/2009  .  COLONIC POLYPS, HX OF 12/21/2009   Past Medical History:  Diagnosis Date  . Anal fistula 12/21/2009  . Anemia   . Arthritis   . Asthma   . CLOSTRIDIUM DIFFICILE COLITIS 04/19/2010  . COLONIC POLYPS, HX OF 12/21/2009  . Complication of anesthesia    trouble keeping pt asleep.   Marland Kitchen CONJUNCTIVITIS, ACUTE 04/11/2010  . GOUT 12/21/2009  . H/O viral pericarditis   . HYPERLIPIDEMIA 12/21/2009  . HYPERTENSION 12/21/2009  . [facility] 12/21/2009  . SINUSITIS- ACUTE-NOS 04/11/2010  . Sleep apnea     Family History  Adopted: Yes  Problem Relation Age of Onset  . Cancer Neg Hx   . Diabetes Neg Hx   . Early death Neg Hx   . Hearing loss Neg Hx   . Heart disease Neg Hx   . Hyperlipidemia Neg Hx   . Hypertension Neg Hx   . Stroke Neg Hx     Past Surgical History:  Procedure Laterality Date  . anal lateral inferior sphincterotomy from c diff    . ANKLE ARTHROSCOPY Right 12/28/2020   Procedure: RIGHT ANKLE ARTHROSCOPY, DEBRIDEMENT AND EXCISION BONE SPUR;  Surgeon: Nadara Mustard, MD;  Location: [facility] OR;  Service: Orthopedics;  Laterality: Right;  . ANKLE DEBRIDEMENT Right   . right ankle ORIF     Social History   Occupational History  . Occupation: vice Management consultant: [employer]  Tobacco Use  . Smoking status: Never Smoker  . Smokeless tobacco: Never Used  Vaping Use  . Vaping Use: Never used  Substance and Sexual Activity  . Alcohol use: Yes    Alcohol/week: 4.0 standard drinks    Types: 4 Shots of liquor per week    Comment: socially  . Drug use: No  . Sexual activity: Yes    Birth control/protection: None

## 2021-02-14 ENCOUNTER — Ambulatory Visit (INDEPENDENT_AMBULATORY_CARE_PROVIDER_SITE_OTHER): Payer: BLUE CROSS/BLUE SHIELD

## 2021-02-14 ENCOUNTER — Telehealth: Payer: Self-pay

## 2021-02-14 ENCOUNTER — Other Ambulatory Visit: Payer: Self-pay

## 2021-02-14 DIAGNOSIS — D510 Vitamin B12 deficiency anemia due to intrinsic factor deficiency: Secondary | ICD-10-CM

## 2021-02-14 MED ORDER — CYANOCOBALAMIN 1000 MCG/ML IJ SOLN
1000.0000 ug | INTRAMUSCULAR | Status: DC
Start: 2021-02-14 — End: 2021-03-11
  Administered 2021-02-14: 1000 ug via INTRAMUSCULAR

## 2021-02-14 NOTE — Telephone Encounter (Signed)
Verbal order rec'd for monthly b12 injections x 12 months or until new order received (whichever comes first).

## 2021-02-14 NOTE — Progress Notes (Signed)
Pt here for monthly B12 injection per Dr Yetta Barre.  B12 given IM right deltoid and pt tolerated injection well.  Pt states he will call/message to schedule next injection.

## 2021-02-21 ENCOUNTER — Ambulatory Visit (INDEPENDENT_AMBULATORY_CARE_PROVIDER_SITE_OTHER): Payer: BLUE CROSS/BLUE SHIELD | Admitting: Orthopedic Surgery

## 2021-02-21 ENCOUNTER — Encounter: Payer: Self-pay | Admitting: Orthopedic Surgery

## 2021-02-21 DIAGNOSIS — M25571 Pain in right ankle and joints of right foot: Secondary | ICD-10-CM

## 2021-02-21 DIAGNOSIS — M7751 Other enthesopathy of right foot: Secondary | ICD-10-CM

## 2021-02-21 NOTE — Progress Notes (Signed)
Office Visit Note   Patient: Rickey Hurley           Date of Birth: 02-10-1966           MRN: [id number] Visit Date: 02/21/2021              Requested by: Etta Grandchild, MD [address] [place],  [place] [address] PCP: Etta Grandchild, MD  Chief Complaint  Patient presents with  . Right Ankle - Routine Post Op    12/28/20 right ankle scope/deb excision bone spur      HPI: Patient is a 55 year old gentleman who is seen 2 months status post right ankle arthroscopy and debridement of bone spur right foot.  Patient states he still has pain over the medial arthroscopic portal he states the cellulitis has resolved with Bactrim he has 3 days remaining.  Assessment & Plan: Visit Diagnoses:  1. Bone spur of right foot   2. Pain in right ankle and joints of right foot     Plan: Recommended wearing the wall alpacas sock he is developing fungal foot infection on the plantar aspect of his foot.  Recommended elevation compression and walking to decrease the swelling in the right foot and ankle reinforce the importance of dorsiflexion ankle stretching.  Follow-Up Instructions: Return if symptoms worsen or fail to improve.   Ortho Exam  Patient is alert, oriented, no adenopathy, well-dressed, normal affect, normal respiratory effort. Examination patient does have pitting edema in both lower extremities the right calf is 36 cm in circumference left calf is 37.5 cm in circumference he has good pulses in the right foot the incisions are all well-healed there is no drainage there is no cellulitis no signs of infection.  Patient is currently using Voltaren gel over the ankle and Mobic.  Recommended holding the Mobic for his abdominal surgery that is coming up.  Imaging: No results found. No images are attached to the encounter.  Labs: Lab Results  Component Value Date   HGBA1C 5.6 07/11/2019   HGBA1C 5.7 05/07/2017   HGBA1C 6.2 06/18/2016   ESRSEDRATE 8 04/19/2010   LABURIC 4.2 02/06/2021    LABURIC 4.9 07/11/2019   LABURIC 5.1 05/07/2017   LABORGA NO GROWTH 04/14/2017     Lab Results  Component Value Date   ALBUMIN 3.9 12/28/2020   ALBUMIN 4.3 07/11/2019   ALBUMIN 4.3 01/06/2018    No results found for: MG Lab Results  Component Value Date   VD25OH 34.87 05/14/2020   VD25OH 24.35 (L) 07/11/2019    No results found for: PREALBUMIN CBC EXTENDED Latest Ref Rng & Units 02/06/2021 05/14/2020 07/11/2019  WBC 3.8 - 10.8 Thousand/uL 7.4 6.8 6.7  RBC 4.20 - 5.80 Million/uL 4.17(L) 4.28 4.23  HGB 13.2 - 17.1 g/dL 12.7(L) 13.1 13.0  HCT 38.5 - 50.0 % 37.8(L) 37.9(L) 38.3(L)  PLT 140 - 400 Thousand/uL 388 259.0 295.0  NEUTROABS 1,500 - 7,800 cells/uL 3,922 3.9 4.1  LYMPHSABS 850 - 3,900 cells/uL 2,523 2.1 1.8     There is no height or weight on file to calculate BMI.  Orders:  No orders of the defined types were placed in this encounter.  No orders of the defined types were placed in this encounter.    Procedures: No procedures performed  Clinical Data: No additional findings.  ROS:  All other systems negative, except as noted in the HPI. Review of Systems  Objective: Vital Signs: There were no vitals taken for this visit.  Specialty  Comments:  No specialty comments available.  PMFS History: Patient Active Problem List   Diagnosis Date Noted  . Vitamin B12 deficiency anemia due to intrinsic factor deficiency 02/13/2021  . Wound infection after surgery 01/17/2021  . Acute post-operative pain 01/17/2021  . Bone spur of right foot   . Post-traumatic osteoarthritis of right ankle 10/01/2020  . Primary osteoarthritis of left hip 05/15/2020  . Chronic left-sided low back pain with left-sided sciatica 05/14/2020  . Tinnitus of both ears 07/11/2019  . Vitamin D deficiency 07/11/2019  . Pure hyperglyceridemia 05/20/2018  . Excessive daytime sleepiness 01/06/2018  . Morbid obesity due to excess calories (HCC) 06/19/2016  . Other seasonal allergic  rhinitis 06/18/2016  . Prediabetes 06/18/2016  . Left lumbar radiculitis 08/22/2015  . Deficiency anemia 05/02/2014  . Asthma, mild intermittent 05/02/2014  . Preventative health care 04/04/2011  . Hyperlipidemia with target LDL less than 130 12/21/2009  . Gout 12/21/2009  . Essential hypertension 12/21/2009  . [facility] 12/21/2009  . Anal fistula 12/21/2009  . COLONIC POLYPS, HX OF 12/21/2009   Past Medical History:  Diagnosis Date  . Anal fistula 12/21/2009  . Anemia   . Arthritis   . Asthma   . CLOSTRIDIUM DIFFICILE COLITIS 04/19/2010  . COLONIC POLYPS, HX OF 12/21/2009  . Complication of anesthesia    trouble keeping pt asleep.   Marland Kitchen CONJUNCTIVITIS, ACUTE 04/11/2010  . GOUT 12/21/2009  . H/O viral pericarditis   . HYPERLIPIDEMIA 12/21/2009  . HYPERTENSION 12/21/2009  . [facility] 12/21/2009  . SINUSITIS- ACUTE-NOS 04/11/2010  . Sleep apnea     Family History  Adopted: Yes  Problem Relation Age of Onset  . Cancer Neg Hx   . Diabetes Neg Hx   . Early death Neg Hx   . Hearing loss Neg Hx   . Heart disease Neg Hx   . Hyperlipidemia Neg Hx   . Hypertension Neg Hx   . Stroke Neg Hx     Past Surgical History:  Procedure Laterality Date  . anal lateral inferior sphincterotomy from c diff    . ANKLE ARTHROSCOPY Right 12/28/2020   Procedure: RIGHT ANKLE ARTHROSCOPY, DEBRIDEMENT AND EXCISION BONE SPUR;  Surgeon: Nadara Mustard, MD;  Location: [facility] OR;  Service: Orthopedics;  Laterality: Right;  . ANKLE DEBRIDEMENT Right   . right ankle ORIF     Social History   Occupational History  . Occupation: vice Management consultant: [employer]  Tobacco Use  . Smoking status: Never Smoker  . Smokeless tobacco: Never Used  Vaping Use  . Vaping Use: Never used  Substance and Sexual Activity  . Alcohol use: Yes    Alcohol/week: 4.0 standard drinks    Types: 4 Shots of liquor per week    Comment: socially  . Drug use: No  . Sexual activity: Yes    Birth control/protection: None

## 2021-03-11 ENCOUNTER — Other Ambulatory Visit: Payer: Self-pay

## 2021-03-11 ENCOUNTER — Encounter: Payer: Self-pay | Admitting: Internal Medicine

## 2021-03-11 ENCOUNTER — Ambulatory Visit (INDEPENDENT_AMBULATORY_CARE_PROVIDER_SITE_OTHER): Payer: BLUE CROSS/BLUE SHIELD | Admitting: Internal Medicine

## 2021-03-11 VITALS — BP 126/82 | HR 88 | Temp 98.5°F | Ht 71.0 in | Wt 313.0 lb

## 2021-03-11 DIAGNOSIS — Z0001 Encounter for general adult medical examination with abnormal findings: Secondary | ICD-10-CM | POA: Diagnosis not present

## 2021-03-11 DIAGNOSIS — M1000 Idiopathic gout, unspecified site: Secondary | ICD-10-CM | POA: Diagnosis not present

## 2021-03-11 DIAGNOSIS — N410 Acute prostatitis: Secondary | ICD-10-CM | POA: Diagnosis not present

## 2021-03-11 DIAGNOSIS — R7303 Prediabetes: Secondary | ICD-10-CM

## 2021-03-11 DIAGNOSIS — D510 Vitamin B12 deficiency anemia due to intrinsic factor deficiency: Secondary | ICD-10-CM

## 2021-03-11 DIAGNOSIS — R3916 Straining to void: Secondary | ICD-10-CM

## 2021-03-11 DIAGNOSIS — E785 Hyperlipidemia, unspecified: Secondary | ICD-10-CM

## 2021-03-11 DIAGNOSIS — N401 Enlarged prostate with lower urinary tract symptoms: Secondary | ICD-10-CM | POA: Diagnosis not present

## 2021-03-11 DIAGNOSIS — R300 Dysuria: Secondary | ICD-10-CM

## 2021-03-11 DIAGNOSIS — R10815 Periumbilic abdominal tenderness: Secondary | ICD-10-CM

## 2021-03-11 DIAGNOSIS — I10 Essential (primary) hypertension: Secondary | ICD-10-CM | POA: Diagnosis not present

## 2021-03-11 LAB — CBC WITH DIFFERENTIAL/PLATELET
Basophils Absolute: 0 10*3/uL (ref 0.0–0.1)
Basophils Relative: 0.3 % (ref 0.0–3.0)
Eosinophils Absolute: 0 10*3/uL (ref 0.0–0.7)
Eosinophils Relative: 0 % (ref 0.0–5.0)
HCT: 36.2 % — ABNORMAL LOW (ref 39.0–52.0)
Hemoglobin: 12.5 g/dL — ABNORMAL LOW (ref 13.0–17.0)
Lymphocytes Relative: 4.8 % — ABNORMAL LOW (ref 12.0–46.0)
Lymphs Abs: 0.8 10*3/uL (ref 0.7–4.0)
MCHC: 34.4 g/dL (ref 30.0–36.0)
MCV: 87.9 fl (ref 78.0–100.0)
Monocytes Absolute: 1.3 10*3/uL — ABNORMAL HIGH (ref 0.1–1.0)
Monocytes Relative: 8 % (ref 3.0–12.0)
Neutro Abs: 13.9 10*3/uL — ABNORMAL HIGH (ref 1.4–7.7)
Neutrophils Relative %: 86.9 % — ABNORMAL HIGH (ref 43.0–77.0)
Platelets: 193 10*3/uL (ref 150.0–400.0)
RBC: 4.12 Mil/uL — ABNORMAL LOW (ref 4.22–5.81)
RDW: 14.3 % (ref 11.5–15.5)
WBC: 15.9 10*3/uL — ABNORMAL HIGH (ref 4.0–10.5)

## 2021-03-11 LAB — LIPID PANEL
Cholesterol: 101 mg/dL (ref 0–200)
HDL: 42.6 mg/dL (ref >39.00)
LDL Cholesterol: 31 mg/dL (ref 0–99)
NonHDL: 58.74
Total CHOL/HDL Ratio: 2
Triglycerides: 138 mg/dL (ref 0.0–149.0)
VLDL: 27.6 mg/dL (ref 0.0–40.0)

## 2021-03-11 LAB — POCT URINALYSIS DIPSTICK
Bilirubin, UA: NEGATIVE
Glucose, UA: NEGATIVE
Ketones, UA: NEGATIVE
Nitrite, UA: POSITIVE
Odor: POSITIVE
Protein, UA: POSITIVE — AB
Spec Grav, UA: 1.01 (ref 1.010–1.025)
Urobilinogen, UA: 0.2 E.U./dL
pH, UA: 7 (ref 5.0–8.0)

## 2021-03-11 LAB — BASIC METABOLIC PANEL
BUN: 16 mg/dL (ref 6–23)
CO2: 27 mEq/L (ref 19–32)
Calcium: 9.3 mg/dL (ref 8.4–10.5)
Chloride: 101 mEq/L (ref 96–112)
Creatinine, Ser: 1.06 mg/dL (ref 0.40–1.50)
GFR: 79.79 mL/min (ref >60.00)
Glucose, Bld: 135 mg/dL — ABNORMAL HIGH (ref 70–99)
Potassium: 3.8 mEq/L (ref 3.5–5.1)
Sodium: 139 mEq/L (ref 135–145)

## 2021-03-11 LAB — HEMOGLOBIN A1C: Hgb A1c MFr Bld: 6.2 % (ref 4.6–6.5)

## 2021-03-11 LAB — PSA: PSA: 10.13 ng/mL — ABNORMAL HIGH (ref 0.10–4.00)

## 2021-03-11 LAB — URIC ACID: Uric Acid, Serum: 4.3 mg/dL (ref 4.0–7.8)

## 2021-03-11 MED ORDER — ALFUZOSIN HCL ER 10 MG PO TB24: 10.0000 mg | ORAL_TABLET | Freq: Every day | ORAL | 1 refills | Status: DC

## 2021-03-11 MED ORDER — SULFAMETHOXAZOLE-TRIMETHOPRIM 800-160 MG PO TABS: 1.0000 | ORAL_TABLET | Freq: Two times a day (BID) | ORAL | 0 refills | Status: DC

## 2021-03-11 MED ORDER — PROMETHAZINE HCL 12.5 MG PO TABS: 12.5000 mg | ORAL_TABLET | Freq: Four times a day (QID) | ORAL | 0 refills | Status: DC | PRN

## 2021-03-11 NOTE — Progress Notes (Signed)
Subjective:  Patient ID: Rickey Hurley, male    DOB: [birth year]  Age: 55 y.o. MRN: [id number]  CC: Annual Exam, Hypertension, and Abdominal Pain  This visit occurred during the SARS-CoV-2 public health emergency.  Safety protocols were in place, including screening questions prior to the visit, additional usage of staff PPE, and extensive cleaning of exam room while observing appropriate contact time as indicated for disinfecting solutions.    HPI Rickey Hurley presents for a CPX.  He comes in for the acute onset, 24-hour history of pelvic and abdominal pain, dysuria, urgency, urinary straining, low-grade fever, nausea without vomiting, and cloudy urine.  Outpatient Medications Prior to Visit  Medication Sig Dispense Refill  . allopurinol (ZYLOPRIM) 300 MG tablet TAKE ONE TABLET BY MOUTH DAILY 90 tablet 0  . Cholecalciferol 50 MCG (2000 UT) TABS Take 1 tablet (2,000 Units total) by mouth daily. (Patient taking differently: Take 2,000 Units by mouth in the morning and at bedtime.) 90 tablet 1  . diclofenac Sodium (VOLTAREN) 1 % GEL Apply 1 application topically in the morning and at bedtime. Applied ankle    . fenofibrate 160 MG tablet TAKE ONE TABLET BY MOUTH DAILY (Patient taking differently: Take 160 mg by mouth every evening.) 90 tablet 1  . hydrocortisone cream 1 % Apply 1 application topically 2 (two) times daily as needed for itching (redness).    . indapamide (LOZOL) 1.25 MG tablet TAKE ONE TABLET BY MOUTH DAILY 90 tablet 0  . irbesartan (AVAPRO) 300 MG tablet TAKE ONE TABLET BY MOUTH DAILY (Patient taking differently: Take 300 mg by mouth daily.) 90 tablet 1  . metoprolol tartrate (LOPRESSOR) 25 MG tablet Take 1 tablet (25 mg total) by mouth 2 (two) times daily. 180 tablet 0  . Multiple Vitamin (MULTIVITAMIN WITH MINERALS) TABS tablet Take 1 tablet by mouth daily. Centrum Multivitamin    . Omega-3 Fatty Acids (FISH OIL) 1200 MG CAPS Take 2,400 mg by mouth daily.    . simvastatin (ZOCOR)  40 MG tablet TAKE ONE TABLET BY MOUTH DAILY 90 tablet 0  . naproxen sodium (ALEVE) 220 MG tablet Take 220 mg by mouth in the morning and at bedtime.    . colchicine 0.6 MG tablet Take 1 tablet (0.6 mg total) by mouth 2 (two) times daily. 60 tablet 3  . meloxicam (MOBIC) 15 MG tablet Take 1 tablet (15 mg total) by mouth daily. 30 tablet 2  . sulfamethoxazole-trimethoprim (BACTRIM DS) 800-160 MG tablet Take 1 tablet by mouth 2 (two) times daily. 20 tablet 0  . tizanidine (ZANAFLEX) 2 MG capsule TAKE ONE CAPSULE BY MOUTH THREE TIMES A DAY AS NEEDED MUSCLE SPASMS (Patient taking differently: Take 2 mg by mouth 3 (three) times daily as needed for muscle spasms.) 90 capsule 2  . cyanocobalamin ((VITAMIN B-12)) injection 1,000 mcg      No facility-administered medications prior to visit.    ROS Review of Systems  Constitutional: Positive for fever. Negative for chills, diaphoresis and fatigue.  HENT: Negative.   Eyes: Negative.   Respiratory: Negative for cough, chest tightness, shortness of breath and wheezing.   Cardiovascular: Negative for chest pain, palpitations and leg swelling.  Gastrointestinal: Positive for abdominal pain and nausea. Negative for abdominal distention, blood in stool, constipation, diarrhea and vomiting.  Endocrine: Negative.   Genitourinary: Positive for difficulty urinating, dysuria and flank pain. Negative for decreased urine volume, frequency, hematuria, penile discharge, penile pain, penile swelling, scrotal swelling, testicular pain and urgency.  Skin: Negative.  Neurological: Positive for headaches. Negative for dizziness, seizures, weakness and numbness.  Hematological: Negative for adenopathy. Does not bruise/bleed easily.  Psychiatric/Behavioral: Negative.     Objective:  BP 126/82   Pulse 88   Temp 98.5 F (36.9 C) (Oral)   Ht 5\' 11"  (1.803 m)   Wt (!) 313 lb (142 kg)   SpO2 99%   BMI 43.65 kg/m   BP Readings from Last 3 Encounters:  03/11/21  126/82  01/17/21 (!) 146/88  12/28/20 128/80    Wt Readings from Last 3 Encounters:  03/11/21 (!) 313 lb (142 kg)  01/21/21 (!) 319 lb (144.7 kg)  01/14/21 (!) 319 lb (144.7 kg)    Physical Exam Vitals reviewed. Exam conducted with a chaperone present.  Constitutional:      General: He is not in acute distress.    Appearance: He is not ill-appearing, toxic-appearing or diaphoretic.  HENT:     Nose: Nose normal.     Mouth/Throat:     Mouth: Mucous membranes are moist.  Eyes:     General: No scleral icterus.    Conjunctiva/sclera: Conjunctivae normal.  Cardiovascular:     Rate and Rhythm: Normal rate and regular rhythm.     Heart sounds: No murmur heard.   Pulmonary:     Effort: Pulmonary effort is normal.     Breath sounds: No stridor. No wheezing, rhonchi or rales.  Abdominal:     General: Abdomen is protuberant. Bowel sounds are normal. There is distension.     Palpations: Abdomen is soft. There is no hepatomegaly, splenomegaly or mass.     Tenderness: There is abdominal tenderness in the periumbilical area, left upper quadrant and left lower quadrant. There is right CVA tenderness and left CVA tenderness.     Hernia: No hernia is present. There is no hernia in the left inguinal area or right inguinal area.  Genitourinary:    Penis: Normal.      Testes: Normal.     Epididymis:     Right: Normal. Not inflamed or enlarged. No mass.     Left: Normal. Not inflamed or enlarged. No mass.     Prostate: Enlarged and tender. No nodules present.     Rectum: Normal. Guaiac result negative. No tenderness, anal fissure, external hemorrhoid or internal hemorrhoid. Normal anal tone.  Musculoskeletal:        General: Normal range of motion.     Cervical back: Neck supple.     Right lower leg: No edema.     Left lower leg: No edema.  Lymphadenopathy:     Cervical: No cervical adenopathy.     Lower Body: No right inguinal adenopathy. No left inguinal adenopathy.  Skin:    General:  Skin is warm and dry.     Coloration: Skin is not pale.  Neurological:     General: No focal deficit present.     Mental Status: He is alert and oriented to person, place, and time. Mental status is at baseline.  Psychiatric:        Mood and Affect: Mood normal.        Behavior: Behavior normal.     Lab Results  Component Value Date   WBC 15.9 (H) 03/11/2021   HGB 12.5 (L) 03/11/2021   HCT 36.2 (L) 03/11/2021   PLT 193.0 03/11/2021   GLUCOSE 135 (H) 03/11/2021   CHOL 101 03/11/2021   TRIG 138.0 03/11/2021   HDL 42.60 03/11/2021   LDLDIRECT 69.0 05/20/2018  LDLCALC 31 03/11/2021   ALT 53 (H) 12/28/2020   AST 32 12/28/2020   NA 139 03/11/2021   K 3.8 03/11/2021   CL 101 03/11/2021   CREATININE 1.06 03/11/2021   BUN 16 03/11/2021   CO2 27 03/11/2021   TSH 2.00 07/11/2019   PSA 10.13 (H) 03/11/2021   HGBA1C 6.2 03/11/2021    Component Ref Range & Units 15:26  (03/11/21) 1 yr ago  (07/11/19) 3 yr ago  (01/06/18) 3 yr ago  (10/21/17) 3 yr ago  (05/07/17) 3 yr ago  (04/14/17) 4 yr ago  (06/18/16)  Color, UA  dark yellow      yellow    Clarity, UA  cloudy      clear    Glucose, UA Negative Negative      negative R    Bilirubin, UA  negative      negative    Ketones, UA  negative      negative    Spec Grav, UA 1.010 - 1.025 1.010      1.020    Blood, UA  1+      negative    pH, UA 5.0 - 8.0 7.0      6.0    Protein, UA Negative PositiveAbnormal      negative R    Urobilinogen, UA 0.2 or 1.0 E.U./dL 0.2  0.2 R  0.2 R  0.2 R  1.0 R  0.2  0.2 R   Nitrite, UA  positive      negative    Leukocytes, UA Negative Moderate (2+)Abnormal   NEGATIVE  NEGATIVE  NEGATIVE  Negative  NEGATIVE   Appearance  dark  CLEAR R  Sl CloudyAbnormal R  CLEAR R  CLEAR R   CLEAR R   Odor  Positive            Assessment & Plan:   Rickey Hurley was seen today for annual exam, hypertension and abdominal pain.  Diagnoses and all orders for this visit:  Essential hypertension- His blood pressure is  adequately well controlled. -     Basic metabolic panel; Future -     Basic metabolic panel  Hyperlipidemia with target LDL less than 130- He has achieved his LDL goal and is doing well on the statin. -     Cancel: Hepatic function panel; Future  Vitamin B12 deficiency anemia due to intrinsic factor deficiency- His H&H has declined some.  Will continue parenteral B12 replacement therapy. -     CBC with Differential/Platelet; Future -     CBC with Differential/Platelet  Prediabetes- His A1c is up to 6.2%.  Medical therapy is not yet indicated. -     Basic metabolic panel; Future -     Hemoglobin A1c; Future -     Hemoglobin A1c -     Basic metabolic panel  Encounter for general adult medical examination with abnormal findings- Exam completed, labs reviewed, vaccines reviewed and updated, cancer screenings are up-to-date, patient education was given. -     Lipid panel; Future -     PSA; Future -     HIV Antibody (routine testing w rflx); Future -     HIV Antibody (routine testing w rflx) -     PSA -     Lipid panel  Dysuria- His urinalysis is abnormal.  His examination is consistent with acute prostatitis but he also has abdominal pain, flank pain, and CVA tenderness.  I have ordered a CT scan of the  abdomen and pelvis with contrast to see if he has a renal abscess, pelvic abscess, prostate abscess, or signs of pyelonephritis. -     CULTURE, URINE COMPREHENSIVE; Future -     POCT Urinalysis Dipstick -     C. trachomatis/N. gonorrhoeae RNA; Future -     C. trachomatis/N. gonorrhoeae RNA -     CULTURE, URINE COMPREHENSIVE  Idiopathic gout, unspecified chronicity, unspecified site- He has achieved his uric acid goal. -     Uric acid; Future -     Uric acid  Benign prostatic hyperplasia (BPH) with straining on urination- Will start a peripheral alpha-blocker. -     alfuzosin (UROXATRAL) 10 MG 24 hr tablet; Take 1 tablet (10 mg total) by mouth daily with breakfast.  Acute prostatitis  with hematuria- See above.  Will treat this with Bactrim DS. -     C. trachomatis/N. gonorrhoeae RNA; Future -     sulfamethoxazole-trimethoprim (BACTRIM DS) 800-160 MG tablet; Take 1 tablet by mouth 2 (two) times daily. -     promethazine (PHENERGAN) 12.5 MG tablet; Take 1 tablet (12.5 mg total) by mouth every 6 (six) hours as needed for nausea or vomiting. -     C. trachomatis/N. gonorrhoeae RNA   I have discontinued Rosanne Ashing Seavey's tizanidine, naproxen sodium, colchicine, meloxicam, and sulfamethoxazole-trimethoprim. I am also having him start on alfuzosin, sulfamethoxazole-trimethoprim, and promethazine. Additionally, I am having him maintain his Cholecalciferol, irbesartan, fenofibrate, multivitamin with minerals, Fish Oil, hydrocortisone cream, diclofenac Sodium, allopurinol, metoprolol tartrate, simvastatin, and indapamide. We will stop administering cyanocobalamin.  Meds ordered this encounter  Medications  . alfuzosin (UROXATRAL) 10 MG 24 hr tablet    Sig: Take 1 tablet (10 mg total) by mouth daily with breakfast.    Dispense:  90 tablet    Refill:  1  . sulfamethoxazole-trimethoprim (BACTRIM DS) 800-160 MG tablet    Sig: Take 1 tablet by mouth 2 (two) times daily.    Dispense:  60 tablet    Refill:  0  . promethazine (PHENERGAN) 12.5 MG tablet    Sig: Take 1 tablet (12.5 mg total) by mouth every 6 (six) hours as needed for nausea or vomiting.    Dispense:  30 tablet    Refill:  0   In addition to time spent on CPE, I spent 45 minutes in preparing to see the patient by review of recent labs, obtaining and reviewing separately obtained history, communicating with the patient, ordering medications, tests, and xrays, and documenting clinical information in the EHR including the differential Dx, treatment, and any further evaluation and other management of 1. Essential hypertension 2. Hyperlipidemia with target LDL less than 130 3. Vitamin B12 deficiency anemia due to intrinsic factor  deficiency 4. Prediabetes 5. Dysuria 6. Idiopathic gout, unspecified chronicity, unspecified site 7. Benign prostatic hyperplasia (BPH) with straining on urination 8. Acute prostatitis with hematuria 9. Periumbilical abdominal tenderness without rebound tenderness      Follow-up: Return in about 3 weeks (around 04/01/2021).  Sanda Linger, MD

## 2021-03-11 NOTE — Patient Instructions (Signed)

## 2021-03-12 ENCOUNTER — Encounter: Payer: Self-pay | Admitting: Internal Medicine

## 2021-03-12 DIAGNOSIS — R10815 Periumbilic abdominal tenderness: Secondary | ICD-10-CM | POA: Insufficient documentation

## 2021-03-12 LAB — C. TRACHOMATIS/N. GONORRHOEAE RNA
C. trachomatis RNA, TMA: NOT DETECTED
N. gonorrhoeae RNA, TMA: NOT DETECTED

## 2021-03-12 LAB — HIV ANTIBODY (ROUTINE TESTING W REFLEX): HIV 1&2 Ab, 4th Generation: NONREACTIVE

## 2021-03-13 ENCOUNTER — Other Ambulatory Visit: Payer: Self-pay

## 2021-03-13 ENCOUNTER — Ambulatory Visit (INDEPENDENT_AMBULATORY_CARE_PROVIDER_SITE_OTHER)
Admission: RE | Admit: 2021-03-13 | Discharge: 2021-03-13 | Disposition: A | Discharge status: Home / Self Care | Payer: BLUE CROSS/BLUE SHIELD | Source: Ambulatory Visit | Attending: Internal Medicine | Admitting: Internal Medicine

## 2021-03-13 ENCOUNTER — Encounter: Payer: Self-pay | Admitting: Internal Medicine

## 2021-03-13 ENCOUNTER — Other Ambulatory Visit: Payer: Self-pay | Admitting: Internal Medicine

## 2021-03-13 DIAGNOSIS — R300 Dysuria: Secondary | ICD-10-CM | POA: Diagnosis not present

## 2021-03-13 DIAGNOSIS — K760 Fatty (change of) liver, not elsewhere classified: Secondary | ICD-10-CM | POA: Diagnosis not present

## 2021-03-13 DIAGNOSIS — R10815 Periumbilic abdominal tenderness: Secondary | ICD-10-CM | POA: Diagnosis not present

## 2021-03-13 DIAGNOSIS — R109 Unspecified abdominal pain: Secondary | ICD-10-CM | POA: Diagnosis not present

## 2021-03-13 DIAGNOSIS — N410 Acute prostatitis: Secondary | ICD-10-CM

## 2021-03-13 LAB — CULTURE, URINE COMPREHENSIVE

## 2021-03-13 MED ORDER — NITROFURANTOIN MONOHYD MACRO 100 MG PO CAPS: 100.0000 mg | ORAL_CAPSULE | Freq: Two times a day (BID) | ORAL | 0 refills | Status: AC

## 2021-03-13 MED ORDER — IOHEXOL 350 MG/ML SOLN
100.0000 mL | Freq: Once | INTRAVENOUS | Status: AC | PRN
  Administered 2021-03-13: 100 mL via INTRAVENOUS

## 2021-03-13 MED ORDER — IOHEXOL 300 MG/ML  SOLN: 100.0000 mL | Freq: Once | INTRAMUSCULAR | Status: DC | PRN

## 2021-03-14 ENCOUNTER — Other Ambulatory Visit: Payer: Self-pay | Admitting: Internal Medicine

## 2021-03-14 DIAGNOSIS — D539 Nutritional anemia, unspecified: Secondary | ICD-10-CM

## 2021-03-15 ENCOUNTER — Encounter: Payer: Self-pay | Admitting: Internal Medicine

## 2021-03-15 ENCOUNTER — Telehealth: Payer: Self-pay | Admitting: Physician Assistant

## 2021-03-15 NOTE — Telephone Encounter (Signed)
Received a new hem referral from Dr. Yetta Barre for anemia. Rickey Hurley has been cld and scheduled to see Rickey Hurley on 4/27 at 11am. Pt aware to arrive 20 minutes early.

## 2021-03-18 DIAGNOSIS — Z713 Dietary counseling and surveillance: Secondary | ICD-10-CM | POA: Diagnosis not present

## 2021-03-19 NOTE — Progress Notes (Signed)
[facility] [phone]   [phone]  INITIAL CONSULT NOTE  Patient Care Team: Etta Grandchild, MD as PCP - General (Internal Medicine)  Hematological/Oncological History 1) Labs from Dr. Sanda Linger, PCP: -02/08/2021: WBC 7.4, Hgb 12.7 (L), MCV 90.6, Plt 259, LDH 133, Haptoglobin 183, Folate >23.6, Ferritin 123.2, Vitamin B12 230, Iron 79 -03/11/2021: WBC 15.9 (H), Hgb 12.5, (L), MCV 87.9, Plt 193  2) Establish care with Georga Kaufmann PA-C  CHIEF COMPLAINTS/PURPOSE OF CONSULTATION:  "Anemia "  HISTORY OF PRESENTING ILLNESS:  Rickey Hurley 55 y.o. male with medical history significant for hypertension, hyperlipidemia, vitamin B12 deficiency due to intrinsic factor deficiency, prediabetes, gout, BPH and obesity.   On review of the previous records, Rickey Hurley underwent right ankle arthroscopy and excision of osteophyte on 12/28/2020. He had complications with wound dehiscence and cellulitis, treated with with Bactrim. He was then diagnosed with acute prostatitis by his PCP, Dr. Yetta Barre on 03/11/2021 and started on Macrobid.   On exam today, Rickey Hurley reports fatigue over the last six months. He is able to complete all his daily activities on his own but requires frequent resting. He has OSA and is compliant with using his CPAP daily. He denies any dizziness or syncopal episodes. He denies any nausea or vomiting. He reports right sided flank pain from recent infection. He currently does not require any pain medication. Patient has intermittent episodes of diarrhea. He denies easy bruising or signs of bleeding. This includes no hematochezia, melena, hematuria, epistaxis, hemoptysis or gingival bleeding. Patient reports having a headache almost every day.He is uncertain of any particular triggers. He denies any vision changes or other focal deficits associated with the headache. Patient notes his hands and feet do get cold without any discoloration. Patient has  shortness of breath with exertion but none at rest. He denies any fevers, chills, chest pain, cough, edema, neuropathy or other skin changes. He has no other complaints. Rest of the 10 point ROS is below.   MEDICAL HISTORY:  Past Medical History:  Diagnosis Date  . Anal fistula 12/21/2009  . Anemia   . Arthritis   . Asthma   . CLOSTRIDIUM DIFFICILE COLITIS 04/19/2010  . COLONIC POLYPS, HX OF 12/21/2009  . Complication of anesthesia    trouble keeping pt asleep.   [facility] CONJUNCTIVITIS, ACUTE 04/11/2010  . GOUT 12/21/2009  . H/O viral pericarditis   . HYPERLIPIDEMIA 12/21/2009  . HYPERTENSION 12/21/2009  . [facility] 12/21/2009  . SINUSITIS- ACUTE-NOS 04/11/2010  . Sleep apnea     SURGICAL HISTORY: Past Surgical History:  Procedure Laterality Date  . anal lateral inferior sphincterotomy from c diff    . ANKLE ARTHROSCOPY Right 12/28/2020   Procedure: RIGHT ANKLE ARTHROSCOPY, DEBRIDEMENT AND EXCISION BONE SPUR;  Surgeon: Nadara Mustard, MD;  Location: [facility] OR;  Service: Orthopedics;  Laterality: Right;  . ANKLE DEBRIDEMENT Right   . right ankle ORIF      SOCIAL HISTORY: Social History   Socioeconomic History  . Marital status: Single    Spouse name: Not on file  . Number of children: 0  . Years of education: Not on file  . Highest education level: Not on file  Occupational History  . Occupation: vice Management consultant: [employer]  Tobacco Use  . Smoking status: Never Smoker  . Smokeless tobacco: Never Used  Vaping Use  . Vaping Use: Never used  Substance and Sexual Activity  . Alcohol use: Yes  Comment: socially  . Drug use: No  . Sexual activity: Yes    Birth control/protection: None  Other Topics Concern  . Not on file  Social History Narrative  . Not on file   Social Determinants of Health   Financial Resource Strain: Not on file  Food Insecurity: Not on file  Transportation Needs: Not on file  Physical Activity: Not on file  Stress: Not on file  Social  Connections: Not on file  Intimate Partner Violence: Not on file    FAMILY HISTORY: Family History  Adopted: Yes  Problem Relation Age of Onset  . Cancer Neg Hx   . Diabetes Neg Hx   . Early death Neg Hx   . Hearing loss Neg Hx   . Heart disease Neg Hx   . Hyperlipidemia Neg Hx   . Hypertension Neg Hx   . Stroke Neg Hx     ALLERGIES:  is allergic to lisinopril and moxifloxacin.  MEDICATIONS:  Current Outpatient Medications  Medication Sig Dispense Refill  . nitrofurantoin, macrocrystal-monohydrate, (MACROBID) 100 MG capsule Take 1 capsule (100 mg total) by mouth 2 (two) times daily. 60 capsule 0  . alfuzosin (UROXATRAL) 10 MG 24 hr tablet Take 1 tablet (10 mg total) by mouth daily with breakfast. 90 tablet 1  . allopurinol (ZYLOPRIM) 300 MG tablet TAKE ONE TABLET BY MOUTH DAILY 90 tablet 0  . diclofenac Sodium (VOLTAREN) 1 % GEL Apply 1 application topically in the morning and at bedtime. Applied ankle    . fenofibrate 160 MG tablet TAKE ONE TABLET BY MOUTH DAILY (Patient taking differently: Take 160 mg by mouth every evening.) 90 tablet 1  . hydrocortisone cream 1 % Apply 1 application topically 2 (two) times daily as needed for itching (redness).    . indapamide (LOZOL) 1.25 MG tablet TAKE ONE TABLET BY MOUTH DAILY 90 tablet 0  . irbesartan (AVAPRO) 300 MG tablet TAKE ONE TABLET BY MOUTH DAILY (Patient taking differently: Take 300 mg by mouth daily.) 90 tablet 1  . metoprolol tartrate (LOPRESSOR) 25 MG tablet Take 1 tablet (25 mg total) by mouth 2 (two) times daily. 180 tablet 0  . promethazine (PHENERGAN) 12.5 MG tablet Take 1 tablet (12.5 mg total) by mouth every 6 (six) hours as needed for nausea or vomiting. (Patient not taking: Reported on 03/20/2021) 30 tablet 0  . simvastatin (ZOCOR) 40 MG tablet TAKE ONE TABLET BY MOUTH DAILY 90 tablet 0   No current facility-administered medications for this visit.    REVIEW OF SYSTEMS:   Constitutional: ( - ) fevers, ( - )  chills ,  ( - ) night sweats Eyes: ( - ) blurriness of vision, ( - ) double vision, ( - ) watery eyes Ears, nose, mouth, throat, and face: ( - ) mucositis, ( - ) sore throat Respiratory: ( - ) cough, ( - ) dyspnea, ( - ) wheezes Cardiovascular: ( - ) palpitation, ( - ) chest discomfort, ( - ) lower extremity swelling Gastrointestinal:  ( - ) nausea, ( - ) heartburn, ( - ) change in bowel habits Skin: ( - ) abnormal skin rashes Lymphatics: ( - ) new lymphadenopathy, ( - ) easy bruising Neurological: ( - ) numbness, ( - ) tingling, ( - ) new weaknesses Behavioral/Psych: ( - ) mood change, ( - ) new changes  All other systems were reviewed with the patient and are negative.  PHYSICAL EXAMINATION: ECOG PERFORMANCE STATUS: 1 - Symptomatic but completely ambulatory  Vitals:   03/20/21 1116  BP: 131/88  Pulse: 69  Resp: 17  Temp: 97.7 F (36.5 C)  SpO2: 99%   Filed Weights   03/20/21 1116  Weight: (!) 301 lb 12.8 oz (136.9 kg)    GENERAL: well appearing male in NAD, obese  SKIN: skin color, texture, turgor are normal, no rashes or significant lesions EYES: conjunctiva are pink and non-injected, sclera clear OROPHARYNX: no exudate, no erythema; lips, buccal mucosa, and tongue normal  NECK: supple, non-tender LYMPH:  no palpable lymphadenopathy in the cervical, axillary or supraclavicular lymph nodes.  LUNGS: clear to auscultation and percussion with normal breathing effort HEART: regular rate & rhythm and no murmurs and no lower extremity edema ABDOMEN: soft, non-tender, non-distended, normal bowel sounds Musculoskeletal: no cyanosis of digits and no clubbing  PSYCH: alert & oriented x 3, fluent speech NEURO: no focal motor/sensory deficits  LABORATORY DATA:  I have reviewed the data as listed CBC Latest Ref Rng & Units 03/20/2021 03/11/2021 02/06/2021  WBC 4.0 - 10.5 K/uL 5.8 15.9(H) 7.4  Hemoglobin 13.0 - 17.0 g/dL 12.5(L) 12.5(L) 12.7(L)  Hematocrit 39.0 - 52.0 % 36.3(L) 36.2(L) 37.8(L)   Platelets 150 - 400 K/uL 283 193.0 388    CMP Latest Ref Rng & Units 03/11/2021 12/28/2020 05/14/2020  Glucose 70 - 99 mg/dL 725(D) 664(Q) 92  BUN 6 - 23 mg/dL 16 18 13   Creatinine 0.40 - 1.50 mg/dL 0.34 7.42  Sodium 135 - 145 mEq/L 139 139 139  Potassium 3.5 - 5.1 mEq/L 3.8 3.5 4.0  Chloride 96 - 112 mEq/L 101 106 106  CO2 19 - 32 mEq/L 27 22 26   Calcium 8.4 - 10.5 mg/dL 9.3 9.3 9.7  Total Protein 6.5 - 8.1 g/dL - 6.5 -  Total Bilirubin 0.3 - 1.2 mg/dL - 0.7 -  Alkaline Phos 38 - 126 U/L - 32(L) -  AST 15 - 41 U/L - 32 -  ALT 0 - 44 U/L - 53(H) -   RADIOGRAPHIC STUDIES: I have personally reviewed the radiological images as listed and agreed with the findings in the report. CT Abdomen Pelvis W Contrast  Result Date: 03/13/2021 CLINICAL DATA:  Abdominal pain. Dysuria, urinary urgency and straining. Low-grade fever. Nausea. Cloudy urine. EXAM: CT ABDOMEN AND PELVIS WITH CONTRAST TECHNIQUE: Multidetector CT imaging of the abdomen and pelvis was performed using the standard protocol following bolus administration of intravenous contrast. CONTRAST:  <See Chart> OMNIPAQUE IOHEXOL 300 MG/ML SOLN, OMNIPAQUE IOHEXOL 350 MG/ML SOLN COMPARISON:  None. FINDINGS: Lower chest: No acute abnormality. Hepatobiliary: Hepatic steatosis. No focal liver abnormality. Gallbladder appears normal. No bile duct dilatation. Pancreas: Unremarkable. No pancreatic ductal dilatation or surrounding inflammatory changes. Spleen: Normal in size without focal abnormality. Adrenals/Urinary Tract: The adrenal glands appear normal. Exophytic cyst arising off the upper pole of the left kidney measures 2.6 cm. No mass or hydronephrosis identified. Partially collapsed urinary bladder with mild circumferential wall thickening and surrounding pericystic soft tissue stranding. Stomach/Bowel: Stomach is within normal limits. Appendix appears normal. No evidence of bowel wall thickening, distention, or inflammatory changes.  Vascular/Lymphatic: Aortic atherosclerosis. No aneurysm. No abdominopelvic adenopathy. Reproductive: Prostate is unremarkable. Other: No free fluid or fluid collections. Musculoskeletal: No acute or significant osseous findings. IMPRESSION: 1. There is mild circumferential wall thickening with surrounding pericystic soft tissue stranding involving the urinary bladder. Correlate for any clinical signs or symptoms of cystitis. 2. Hepatic steatosis. 3. Aortic atherosclerosis. Aortic Atherosclerosis (ICD10-I70.0). Electronically Signed   By: 03/15/2021  M.D.   On: 03/13/2021 13:50    ASSESSMENT & PLAN Rickey GuileJim Hurley is a 55 y.o. male presenting to the clinic for evaluation for anemia. I reviewed most recent labs from 03/11/2021 that revealed normocytic anemia with hemoglobin of 12.5 and MCV of 87.9. Patient was recently diagnosed with B12 deficiency and had first and only B12 injection on 02/14/2021. Likely etiology is B12 deficiency but I will do thorough workup to rule out other causes. This includes labs to check CBC w/ diff, vitamin B12, ferritin, retic panel, iron and TIBC, copper level, SPEP and save smear.   #Normocytic anemia: --Recently diagnosed with B12 deficiency by PCP, started on B12 injections with most recent dose on 02/14/2021.  --Recommend further workup to include labs to check CBC w/ diff, vitamin B12, ferritin, retic panel, iron and TIBC, copper level, SPEP and save smear.  --RTC in 3 months with repeat labs unless above workup requires further intervention.      Orders Placed This Encounter  Procedures  . Retic Panel    Standing Status:   Future    Number of Occurrences:   1    Standing Expiration Date:   03/20/2022  . Iron and TIBC    Standing Status:   Future    Number of Occurrences:   1    Standing Expiration Date:   03/20/2022  . CBC with Differential (Cancer Center Only)    Standing Status:   Future    Number of Occurrences:   1    Standing Expiration Date:   03/20/2022  .  Save Smear (SS[facility])    Standing Status:   Future    Number of Occurrences:   1    Standing Expiration Date:   03/20/2022  . Copper, serum    Standing Status:   Future    Number of Occurrences:   1    Standing Expiration Date:   03/20/2022  . Vitamin B12    Standing Status:   Future    Number of Occurrences:   1    Standing Expiration Date:   03/20/2022  . Ferritin    Standing Status:   Future    Number of Occurrences:   1    Standing Expiration Date:   03/20/2022  . SPEP with reflex to IFE    Standing Status:   Future    Number of Occurrences:   1    Standing Expiration Date:   03/20/2022    All questions were answered. The patient knows to call the clinic with any problems, questions or concerns.  A total of more than 60 minutes were spent on this encounter and over half of that time was spent on counseling and coordination of care as outlined above.    Georga KaufmannIrene Cypher Paule, PA-C [facility]ology[facility] Phone: [phone]

## 2021-03-20 ENCOUNTER — Inpatient Hospital Stay: Payer: BLUE CROSS/BLUE SHIELD

## 2021-03-20 ENCOUNTER — Other Ambulatory Visit: Payer: Self-pay

## 2021-03-20 ENCOUNTER — Inpatient Hospital Stay: Payer: BLUE CROSS/BLUE SHIELD | Attending: Physician Assistant | Admitting: Physician Assistant

## 2021-03-20 ENCOUNTER — Encounter: Payer: Self-pay | Admitting: Physician Assistant

## 2021-03-20 VITALS — BP 131/88 | HR 69 | Temp 97.7°F | Resp 17 | Ht 71.0 in | Wt 301.8 lb

## 2021-03-20 DIAGNOSIS — E538 Deficiency of other specified B group vitamins: Secondary | ICD-10-CM | POA: Insufficient documentation

## 2021-03-20 DIAGNOSIS — D649 Anemia, unspecified: Secondary | ICD-10-CM | POA: Insufficient documentation

## 2021-03-20 LAB — CBC WITH DIFFERENTIAL (CANCER CENTER ONLY)
Abs Immature Granulocytes: 0.11 10*3/uL — ABNORMAL HIGH (ref 0.00–0.07)
Basophils Absolute: 0.1 10*3/uL (ref 0.0–0.1)
Basophils Relative: 1 %
Eosinophils Absolute: 0.1 10*3/uL (ref 0.0–0.5)
Eosinophils Relative: 2 %
HCT: 36.3 % — ABNORMAL LOW (ref 39.0–52.0)
Hemoglobin: 12.5 g/dL — ABNORMAL LOW (ref 13.0–17.0)
Immature Granulocytes: 2 %
Lymphocytes Relative: 33 %
Lymphs Abs: 1.9 10*3/uL (ref 0.7–4.0)
MCH: 30.1 pg (ref 26.0–34.0)
MCHC: 34.4 g/dL (ref 30.0–36.0)
MCV: 87.5 fL (ref 80.0–100.0)
Monocytes Absolute: 0.5 10*3/uL (ref 0.1–1.0)
Monocytes Relative: 8 %
Neutro Abs: 3.2 10*3/uL (ref 1.7–7.7)
Neutrophils Relative %: 54 %
Platelet Count: 283 10*3/uL (ref 150–400)
RBC: 4.15 MIL/uL — ABNORMAL LOW (ref 4.22–5.81)
RDW: 13.4 % (ref 11.5–15.5)
WBC Count: 5.8 10*3/uL (ref 4.0–10.5)
nRBC: 0 % (ref 0.0–0.2)

## 2021-03-20 LAB — IRON AND TIBC
Iron: 106 ug/dL (ref 42–163)
Saturation Ratios: 30 % (ref 20–55)
TIBC: 350 ug/dL (ref 202–409)
UIBC: 244 ug/dL (ref 117–376)

## 2021-03-20 LAB — RETIC PANEL
Immature Retic Fract: 22.8 % — ABNORMAL HIGH (ref 2.3–15.9)
RBC.: 4.09 MIL/uL — ABNORMAL LOW (ref 4.22–5.81)
Retic Count, Absolute: 63 10*3/uL (ref 19.0–186.0)
Retic Ct Pct: 1.5 % (ref 0.4–3.1)
Reticulocyte Hemoglobin: 36.1 pg (ref >27.9)

## 2021-03-20 LAB — FERRITIN: Ferritin: 259 ng/mL (ref 24–336)

## 2021-03-20 LAB — VITAMIN B12: Vitamin B-12: 699 pg/mL (ref 180–914)

## 2021-03-20 LAB — SAVE SMEAR(SSMR), FOR PROVIDER SLIDE REVIEW

## 2021-03-21 DIAGNOSIS — D649 Anemia, unspecified: Secondary | ICD-10-CM | POA: Insufficient documentation

## 2021-03-22 ENCOUNTER — Encounter: Payer: Self-pay | Admitting: Physician Assistant

## 2021-03-22 ENCOUNTER — Telehealth: Payer: Self-pay | Admitting: Physician Assistant

## 2021-03-22 DIAGNOSIS — Z01818 Encounter for other preprocedural examination: Secondary | ICD-10-CM | POA: Diagnosis not present

## 2021-03-22 DIAGNOSIS — Z6841 Body Mass Index (BMI) 40.0 and over, adult: Secondary | ICD-10-CM | POA: Diagnosis not present

## 2021-03-22 LAB — PROTEIN ELECTROPHORESIS, SERUM, WITH REFLEX
A/G Ratio: 1.1 (ref 0.7–1.7)
Albumin ELP: 3.6 g/dL (ref 2.9–4.4)
Alpha-1-Globulin: 0.2 g/dL (ref 0.0–0.4)
Alpha-2-Globulin: 0.8 g/dL (ref 0.4–1.0)
Beta Globulin: 1.2 g/dL (ref 0.7–1.3)
Gamma Globulin: 1 g/dL (ref 0.4–1.8)
Globulin, Total: 3.2 g/dL (ref 2.2–3.9)
Total Protein ELP: 6.8 g/dL (ref 6.0–8.5)

## 2021-03-22 NOTE — Telephone Encounter (Signed)
Scheduled per los. Called and spoke with patient. Confirmed appt 

## 2021-03-23 LAB — COPPER, SERUM: Copper: 112 ug/dL (ref 69–132)

## 2021-04-04 ENCOUNTER — Ambulatory Visit (INDEPENDENT_AMBULATORY_CARE_PROVIDER_SITE_OTHER): Payer: BLUE CROSS/BLUE SHIELD

## 2021-04-04 ENCOUNTER — Other Ambulatory Visit: Payer: Self-pay

## 2021-04-04 DIAGNOSIS — D510 Vitamin B12 deficiency anemia due to intrinsic factor deficiency: Secondary | ICD-10-CM

## 2021-04-04 MED ORDER — CYANOCOBALAMIN 1000 MCG/ML IJ SOLN
1000.0000 ug | Freq: Once | INTRAMUSCULAR | Status: AC
  Administered 2021-04-04: 1000 ug via INTRAMUSCULAR

## 2021-04-04 NOTE — Progress Notes (Addendum)
Patient here for B12 injection per Dr. Sanda Linger. B12 1000 mcg given IM in right arm and patient tolerated injection well today.  I have reviewed and agree

## 2021-04-09 DIAGNOSIS — E785 Hyperlipidemia, unspecified: Secondary | ICD-10-CM | POA: Diagnosis not present

## 2021-04-09 DIAGNOSIS — Z79899 Other long term (current) drug therapy: Secondary | ICD-10-CM | POA: Diagnosis not present

## 2021-04-09 DIAGNOSIS — J4520 Mild intermittent asthma, uncomplicated: Secondary | ICD-10-CM | POA: Diagnosis not present

## 2021-04-09 DIAGNOSIS — M1990 Unspecified osteoarthritis, unspecified site: Secondary | ICD-10-CM | POA: Diagnosis not present

## 2021-04-09 DIAGNOSIS — R7303 Prediabetes: Secondary | ICD-10-CM | POA: Diagnosis not present

## 2021-04-09 DIAGNOSIS — R350 Frequency of micturition: Secondary | ICD-10-CM | POA: Diagnosis not present

## 2021-04-09 DIAGNOSIS — N401 Enlarged prostate with lower urinary tract symptoms: Secondary | ICD-10-CM | POA: Diagnosis not present

## 2021-04-09 DIAGNOSIS — G4733 Obstructive sleep apnea (adult) (pediatric): Secondary | ICD-10-CM | POA: Diagnosis not present

## 2021-04-09 DIAGNOSIS — K2950 Unspecified chronic gastritis without bleeding: Secondary | ICD-10-CM | POA: Diagnosis not present

## 2021-04-09 DIAGNOSIS — Z6841 Body Mass Index (BMI) 40.0 and over, adult: Secondary | ICD-10-CM | POA: Diagnosis not present

## 2021-04-09 DIAGNOSIS — D649 Anemia, unspecified: Secondary | ICD-10-CM | POA: Diagnosis not present

## 2021-04-09 DIAGNOSIS — M109 Gout, unspecified: Secondary | ICD-10-CM | POA: Diagnosis not present

## 2021-04-09 DIAGNOSIS — I10 Essential (primary) hypertension: Secondary | ICD-10-CM | POA: Diagnosis not present

## 2021-04-09 DIAGNOSIS — N390 Urinary tract infection, site not specified: Secondary | ICD-10-CM | POA: Diagnosis not present

## 2021-05-09 DIAGNOSIS — Z713 Dietary counseling and surveillance: Secondary | ICD-10-CM | POA: Diagnosis not present

## 2021-05-15 ENCOUNTER — Other Ambulatory Visit: Payer: Self-pay | Admitting: Internal Medicine

## 2021-05-15 DIAGNOSIS — M1A09X0 Idiopathic chronic gout, multiple sites, without tophus (tophi): Secondary | ICD-10-CM

## 2021-05-15 DIAGNOSIS — E785 Hyperlipidemia, unspecified: Secondary | ICD-10-CM

## 2021-06-18 ENCOUNTER — Ambulatory Visit (INDEPENDENT_AMBULATORY_CARE_PROVIDER_SITE_OTHER): Payer: BLUE CROSS/BLUE SHIELD | Admitting: Neurology

## 2021-06-18 ENCOUNTER — Other Ambulatory Visit: Payer: Self-pay

## 2021-06-18 ENCOUNTER — Encounter: Payer: Self-pay | Admitting: Neurology

## 2021-06-18 VITALS — BP 127/87 | HR 67 | Ht 71.0 in | Wt 248.0 lb

## 2021-06-18 DIAGNOSIS — Z9884 Bariatric surgery status: Secondary | ICD-10-CM | POA: Diagnosis not present

## 2021-06-18 DIAGNOSIS — R634 Abnormal weight loss: Secondary | ICD-10-CM | POA: Diagnosis not present

## 2021-06-18 DIAGNOSIS — G4733 Obstructive sleep apnea (adult) (pediatric): Secondary | ICD-10-CM | POA: Diagnosis not present

## 2021-06-18 DIAGNOSIS — Z9989 Dependence on other enabling machines and devices: Secondary | ICD-10-CM | POA: Diagnosis not present

## 2021-06-18 NOTE — Patient Instructions (Addendum)
It was nice to see you again today. You continue to be compliant with your CPAP. I am glad to hear that you have done well after the weight loss surgery.  As discussed, we will lower your set pressure to 11 cm at this time.  We can review your compliance data in about 2 to 3 months, please remind [place] via MyChart email or phone call. If you continue to do well and you have continued to lose weight, we can reduce the pressure further.  We can reevaluate your sleep apnea with a home sleep test at the next visit or before your next visit.   You can keep in touch via [employer]. Please continue using your CPAP regularly. While your insurance requires that you use CPAP at least 4 hours each night on 70% of the nights, I recommend, that you not skip any nights and use it throughout the night if you can. Getting used to CPAP and staying with the treatment long term does take time and patience and discipline. Untreated obstructive sleep apnea when it is moderate to severe can have an adverse impact on cardiovascular health and raise her risk for heart disease, arrhythmias, hypertension, congestive heart failure, stroke and diabetes. Untreated obstructive sleep apnea causes sleep disruption, nonrestorative sleep, and sleep deprivation. This can have an impact on your day to day functioning and cause daytime sleepiness and impairment of cognitive function, memory loss, mood disturbance, and problems focussing. Using CPAP regularly can improve these symptoms. We can see you in 1 year, you can see one of our nurse practitioners as you are stable.  I have renewed your CPAP supplies and we will send the order to your DME company.

## 2021-06-18 NOTE — Progress Notes (Signed)
Subjective:    Patient ID: Rickey Hurley is a 55 y.o. male.  HPI    Interim history:   Rickey Hurley is a 55 year old right-handed gentleman with an underlying medical history of gout, hypertension, gout, Hx of pericarditis, arthritis with s/p R ankle surgery in 12/2020,  hyperlipidemia, allergic rhinitis, pre-diabetes and obesity, with s/p duodenal switch in 05/22, who presents for follow-up consultation of his obstructive sleep apnea. The patient is unaccompanied today and presents after a long gap of nearly 3 years. I last saw him on 06/30/2018, at which time he was compliant with his CPAP, he felt improved with regards to his daytime somnolence, sleep quality and sleep consolidation.  He had also lost weight.  He had interim weight loss surgery.  Today, 06/18/2021: I reviewed his CPAP compliance data from 05/18/2021 through 06/16/2021, which is a total of 30 days, during which time he used his machine every night with percent use days greater than 4 hours at 98%, indicating excellent compliance with an average usage of 6 hours and 27 minutes, residual AHI at goal at 0.5/h, leak on the low side with a 95th percentile at 1.6 L/min on a pressure of 13 cm with EPR of 2.  He reports doing well with his CPAP.  He has continued to benefit from it.  He has lost about 60 pounds thus far.  He has noticed more bloating and gas in the recent past.  This causes him to feel more uncomfortable in the early morning hours and wakes him up early.  He also has pressure on the bladder because of the bloating.  He has been using an AirTouch full facemask size large but insurance would not cover it well.  He then got his supplies online.  He would like to go back to the DME company for ongoing supplies.  He is interested in reevaluation of his sleep apnea but is agreeable to reducing the pressure for now.  He has also used a silicone rimmed mask successfully. His right ankle eventually healed up.  He had an infection which  needed prolonged IV and oral antibiotic treatment.  The patient's allergies, current medications, family history, past medical history, past social history, past surgical history and problem list were reviewed and updated as appropriate.    Previously:  I first met him on 02/08/2018 at the request of his primary care physician, at which time the patient reported snoring and significant daytime somnolence. He was advised to proceed with sleep study testing. He had a baseline sleep study in April, followed by a CPAP titration study in May 2019. I went over his test results with him in detail today. Baseline sleep study from 03/08/2018 showed a significantly reduced sleep efficiency at 54.4% with a delayed sleep onset at 175 minutes and increase and wake after sleep onset at 122.5 minutes. He had an increased percentage of stage I sleep, and a decreased percentage of REM sleep. Total AHI was in the severe range at 44.3 per hour, REM AHI was 65.1 per hour, with minimal supine sleep achieved. Average oxygen saturation was 97%, nadir was 75% with time below 89% saturation of 61 minutes for the night. He had no significant PLMS. He was advised to return for a full night CPAP titration study which he had on 04/08/2018. Sleep efficiency was 85.8%, sleep latency 19.5 minutes, REM latency 67.5 minutes. He had normal percentages of his sleep stages. He was titrated via full face mask from 5 cm to 13 cm.  On the final setting his AHI was 0 per hour, supine REM sleep was achieved and O2 nadir was 95%. Based on his test results I prescribed CPAP therapy for home use a pressure of 13 cm.   I reviewed his CPAP compliance data from 05/30/2018 through 06/28/2018 which is a total of 30 days, during which time he used his CPAP every night with percent used days greater than 4 hours at 87%, indicating very good compliance with an average usage of 6 hours and 10 minutes, residual AHI at goal at 0.3 per hour, leak acceptable with  the 95th percentile at 2.4 L/m on a pressure of 13 cm with EPR of 3.    02/08/2018: (He) reports significant daytime somnolence. Symptoms have been ongoing for about 3+ months. He has also struggled with recurrent sinusitis in the past few months. He believes that he may snore a little bit, has woken himself up with some snoring and also rare occasions with gasping sensation. He has had rare acid reflux symptoms at night particularly when sleeping on his stomach. He has a history of bruxism and is wearing a bite guard. Family history is not known to him as he is adopted. He keeps a fairly thick schedule for his bedtime and rise time. Bedtime is sometimes as early as 9:30 but typically before 11 PM, does not take them long to fall asleep. He does not typically watch TV at that time. He does have a TV in his bedroom but turns it on typically in the mornings. Rise time is 6 AM. He denies telltale symptoms of restless leg syndrome but has had some leg jerking infrequently. Weight has been fluctuating. Recently he tried a weight loss medication but after initial success he plateaued. He does not typically have morning headaches or nocturia on a night to night basis. I reviewed your office note from 01/06/2018 as well as 10/21/2017. His Epworth sleepiness score is 18 out of 24 today, fatigue score is 48 out of 63. He lives alone, he is a professor at [employer]. He has no children, is a nonsmoker, drinks alcohol occasionally, maybe once a month or so, caffeine daily in the form of coffee, up to 20 ounces per day. He has one dog who sleeps on his skin thighs bed with him typically.   His Past Medical History Is Significant For: Past Medical History:  Diagnosis Date   Anal fistula 12/21/2009   Anemia    Arthritis    Asthma    CLOSTRIDIUM DIFFICILE COLITIS 04/19/2010   COLONIC POLYPS, HX OF 5/97/4163   Complication of anesthesia    trouble keeping pt asleep.    CONJUNCTIVITIS, ACUTE 04/11/2010   GOUT 12/21/2009    H/O viral pericarditis    HYPERLIPIDEMIA 12/21/2009   HYPERTENSION 12/21/2009   PAC 12/21/2009   SINUSITIS- ACUTE-NOS 04/11/2010   Sleep apnea     His Past Surgical History Is Significant For: Past Surgical History:  Procedure Laterality Date   anal lateral inferior sphincterotomy from c diff     ANKLE ARTHROSCOPY Right 12/28/2020   Procedure: RIGHT ANKLE ARTHROSCOPY, DEBRIDEMENT AND EXCISION BONE SPUR;  Surgeon: Newt Minion, MD;  Location: [facility];  Service: Orthopedics;  Laterality: Right;   ANKLE DEBRIDEMENT Right    right ankle ORIF      His Family History Is Significant For: Family History  Adopted: Yes  Problem Relation Age of Onset   Cancer Neg Hx    Diabetes Neg Hx  Early death Neg Hx    Hearing loss Neg Hx    Heart disease Neg Hx    Hyperlipidemia Neg Hx    Hypertension Neg Hx    Stroke Neg Hx     His Social History Is Significant For: Social History   Socioeconomic History   Marital status: Single    Spouse name: Not on file   Number of children: 0   Years of education: Not on file   Highest education level: Not on file  Occupational History   Occupation: vice Press photographer: [employer]  Tobacco Use   Smoking status: Never   Smokeless tobacco: Never  Vaping Use   Vaping Use: Never used  Substance and Sexual Activity   Alcohol use: Yes    Comment: socially   Drug use: No   Sexual activity: Yes    Birth control/protection: None  Other Topics Concern   Not on file  Social History Narrative   Not on file   Social Determinants of Health   Financial Resource Strain: Not on file  Food Insecurity: Not on file  Transportation Needs: Not on file  Physical Activity: Not on file  Stress: Not on file  Social Connections: Not on file    His Allergies Are:  Allergies  Allergen Reactions   Lisinopril Cough   Moxifloxacin     REACTION: c diff with subsequent bowel resection  :   His Current Medications Are:  Outpatient Encounter  Medications as of 06/18/2021  Medication Sig   alfuzosin (UROXATRAL) 10 MG 24 hr tablet Take 1 tablet (10 mg total) by mouth daily with breakfast.   allopurinol (ZYLOPRIM) 300 MG tablet TAKE ONE TABLET BY MOUTH DAILY   fenofibrate 160 MG tablet TAKE ONE TABLET BY MOUTH DAILY (Patient taking differently: Take 160 mg by mouth every evening.)   simvastatin (ZOCOR) 40 MG tablet TAKE ONE TABLET BY MOUTH DAILY   ursodiol (ACTIGALL) 250 MG tablet Take 250 mg by mouth daily.   [DISCONTINUED] diclofenac Sodium (VOLTAREN) 1 % GEL Apply 1 application topically in the morning and at bedtime. Applied ankle   [DISCONTINUED] hydrocortisone cream 1 % Apply 1 application topically 2 (two) times daily as needed for itching (redness).   [DISCONTINUED] indapamide (LOZOL) 1.25 MG tablet TAKE ONE TABLET BY MOUTH DAILY   [DISCONTINUED] irbesartan (AVAPRO) 300 MG tablet TAKE ONE TABLET BY MOUTH DAILY (Patient taking differently: Take 300 mg by mouth daily.)   [DISCONTINUED] metoprolol tartrate (LOPRESSOR) 25 MG tablet Take 1 tablet (25 mg total) by mouth 2 (two) times daily.   [DISCONTINUED] promethazine (PHENERGAN) 12.5 MG tablet Take 1 tablet (12.5 mg total) by mouth every 6 (six) hours as needed for nausea or vomiting. (Patient not taking: Reported on 03/20/2021)   No facility-administered encounter medications on file as of 06/18/2021.  :  Review of Systems:  Out of a complete 14 point review of systems, all are reviewed and negative with the exception of these symptoms as listed below:  Review of Systems  Neurological:        CPAP, stable, needing supplies.    Objective:  Neurological Exam  Physical Exam Physical Examination:   Vitals:   06/18/21 0727  BP: 127/87  Pulse: 67    General Examination: The patient is a very pleasant 55 y.o. male in no acute distress. He appears well-developed and well-nourished and well groomed.   HEENT: Normocephalic, atraumatic, pupils are equal, round and reactive to  light, extraocular tracking  is well-preserved, face is symmetric with normal facial animation, speech is clear without dysarthria, hypophonia or voice tremor.  Airway examination reveals mild mouth dryness, stable findings otherwise, tongue protrudes centrally and palate elevates symmetrically.  No carotid bruits.    Chest: Clear to auscultation without wheezing, rhonchi or crackles noted.   Heart: S1+S2+0, regular and normal without murmurs, rubs or gallops noted.   Abdomen: Soft, non-tender and non-distended.   Extremities: There is trace edema right lateral ankle.     Skin: Warm and dry without trophic changes noted.   Musculoskeletal: exam reveals no obvious joint deformities.   Neurologically: Mental status: The patient is awake, alert and oriented in all 4 spheres. His immediate and remote memory, attention, language skills and fund of knowledge are appropriate. There is no evidence of aphasia, agnosia, apraxia or anomia. Speech is clear with normal prosody and enunciation. Thought process is linear. Mood is normal and affect is normal. Cranial nerves II - XII are as described above under HEENT exam. Motor exam: Normal bulk, strength and tone is noted. There is no tremor. Romberg is negative. Fine motor skills and coordination: grossly intact. Cerebellar testing: No dysmetria or intention tremor. There is no truncal or gait ataxia. Sensory exam: intact to light touch in the upper and lower extremities.   Assessment and Plan:    In summary, Declan Mier is a 54 year old male with an underlying medical history of gout, hypertension, gout, Hx of pericarditis, arthritis with s/p R ankle surgery in 12/2020,  hyperlipidemia, allergic rhinitis, pre-diabetes and obesity, with s/p duodenal switch in 05/22, who presents for follow-up consultation of his obstructive sleep apnea. The patient is unaccompanied today and presents after a long gap of nearly 3 years.  He has done well with CPAP therapy and  continues to benefit from it.  He has lost quite a bit of weight thus far after his weight loss surgery in May 2022.  He is agreeable to reducing his set pressure from 13 cm to 11 cm at this time.  We will continue to monitor his progress.  He is advised to get in touch with [place] via MyChart messaging or phone call in about 2 to 3 months and we can review his download at the time on the new setting and further reduce the pressure if the need arises.  He is advised to continue with CPAP therapy for now.  His baseline sleep study from 03/08/2018 showed evidence of severe obstructive sleep apnea. He had a CPAP titration study on 04/08/2018 which showed good results with CPAP of 13 cm. He is commended for his treatment adherence.  At the next appointment, or even before if need be we can reassess his sleep apnea with a home sleep test.  I renewed his supplies and we will send the order to his DME company.  He is advised to routinely follow-up to see one of our nurse practitioners in 1 year, sooner if needed.  He is encouraged to stay in touch via [place] email and remind [place] to call his compliance data remotely in the next 2 to 3 months.  He is agreeable to the pressure reduction.  I answered all his questions today and he was in agreement with the plan.I spent 30 minutes in total face-to-face time and in reviewing records during pre-charting, more than 50% of which was spent in counseling and coordination of care, reviewing test results, reviewing medications and treatment regimen and/or in discussing or reviewing the diagnosis of  OSA, the prognosis and treatment options. Pertinent laboratory and imaging test results that were available during this visit with the patient were reviewed by me and considered in my medical decision making (see chart for details).

## 2021-06-19 ENCOUNTER — Telehealth: Payer: Self-pay | Admitting: Hematology and Oncology

## 2021-06-19 NOTE — Telephone Encounter (Signed)
R/s 7/29 appt to an earlier time do to provider template change. Called and lerft msg.

## 2021-06-21 ENCOUNTER — Inpatient Hospital Stay: Payer: BLUE CROSS/BLUE SHIELD | Admitting: Hematology and Oncology

## 2021-06-21 ENCOUNTER — Inpatient Hospital Stay: Payer: BLUE CROSS/BLUE SHIELD

## 2021-06-21 ENCOUNTER — Other Ambulatory Visit: Payer: Self-pay | Admitting: Hematology and Oncology

## 2021-06-21 ENCOUNTER — Other Ambulatory Visit: Payer: Self-pay

## 2021-06-21 ENCOUNTER — Inpatient Hospital Stay (HOSPITAL_BASED_OUTPATIENT_CLINIC_OR_DEPARTMENT_OTHER): Payer: BLUE CROSS/BLUE SHIELD | Admitting: Hematology and Oncology

## 2021-06-21 ENCOUNTER — Inpatient Hospital Stay: Payer: BLUE CROSS/BLUE SHIELD | Attending: Hematology and Oncology

## 2021-06-21 VITALS — BP 130/90 | HR 67 | Temp 98.7°F | Resp 20 | Wt 244.7 lb

## 2021-06-21 DIAGNOSIS — D649 Anemia, unspecified: Secondary | ICD-10-CM | POA: Diagnosis not present

## 2021-06-21 DIAGNOSIS — E538 Deficiency of other specified B group vitamins: Secondary | ICD-10-CM | POA: Diagnosis not present

## 2021-06-21 LAB — VITAMIN B12: Vitamin B-12: 447 pg/mL (ref 180–914)

## 2021-06-21 LAB — CBC WITH DIFFERENTIAL (CANCER CENTER ONLY)
Abs Immature Granulocytes: 0.01 10*3/uL (ref 0.00–0.07)
Basophils Absolute: 0 10*3/uL (ref 0.0–0.1)
Basophils Relative: 1 %
Eosinophils Absolute: 0.1 10*3/uL (ref 0.0–0.5)
Eosinophils Relative: 1 %
HCT: 36.2 % — ABNORMAL LOW (ref 39.0–52.0)
Hemoglobin: 12.6 g/dL — ABNORMAL LOW (ref 13.0–17.0)
Immature Granulocytes: 0 %
Lymphocytes Relative: 39 %
Lymphs Abs: 1.7 10*3/uL (ref 0.7–4.0)
MCH: 30.8 pg (ref 26.0–34.0)
MCHC: 34.8 g/dL (ref 30.0–36.0)
MCV: 88.5 fL (ref 80.0–100.0)
Monocytes Absolute: 0.5 10*3/uL (ref 0.1–1.0)
Monocytes Relative: 12 %
Neutro Abs: 2.1 10*3/uL (ref 1.7–7.7)
Neutrophils Relative %: 47 %
Platelet Count: 197 10*3/uL (ref 150–400)
RBC: 4.09 MIL/uL — ABNORMAL LOW (ref 4.22–5.81)
RDW: 14 % (ref 11.5–15.5)
WBC Count: 4.4 10*3/uL (ref 4.0–10.5)
nRBC: 0 % (ref 0.0–0.2)

## 2021-06-21 LAB — CMP (CANCER CENTER ONLY)
ALT: 22 U/L (ref 0–44)
AST: 23 U/L (ref 15–41)
Albumin: 3.9 g/dL (ref 3.5–5.0)
Alkaline Phosphatase: 52 U/L (ref 38–126)
Anion gap: 8 (ref 5–15)
BUN: 16 mg/dL (ref 6–20)
CO2: 26 mmol/L (ref 22–32)
Calcium: 9.4 mg/dL (ref 8.9–10.3)
Chloride: 108 mmol/L (ref 98–111)
Creatinine: 0.76 mg/dL (ref 0.61–1.24)
GFR, Estimated: >60 mL/min (ref >60)
Glucose, Bld: 89 mg/dL (ref 70–99)
Potassium: 4.2 mmol/L (ref 3.5–5.1)
Sodium: 142 mmol/L (ref 135–145)
Total Bilirubin: 0.6 mg/dL (ref 0.3–1.2)
Total Protein: 6.6 g/dL (ref 6.5–8.1)

## 2021-06-21 LAB — RETIC PANEL
Immature Retic Fract: 9.4 % (ref 2.3–15.9)
RBC.: 4.02 MIL/uL — ABNORMAL LOW (ref 4.22–5.81)
Retic Count, Absolute: 50.3 10*3/uL (ref 19.0–186.0)
Retic Ct Pct: 1.3 % (ref 0.4–3.1)
Reticulocyte Hemoglobin: 35.2 pg (ref >27.9)

## 2021-06-21 NOTE — Progress Notes (Signed)
[place] Telephone:([phone]) [phone]   [phone]  PROGRESS NOTE  Patient Care Team: Janith Lima, MD as PCP - General (Internal Medicine)  Hematological/Oncological History # Normocytic Anemia 1) Labs from Dr. Scarlette Calico, PCP: -02/08/2021: WBC 7.4, Hgb 12.7 (L), MCV 90.6, Plt 259, LDH 133, Haptoglobin 183, Folate >23.6, Ferritin 123.2, Vitamin B12 230, Iron 79 -03/11/2021: WBC 15.9 (H), Hgb 12.5, (L), MCV 87.9, Plt 193  2) Establish care with Dede Query PA-C 3) 06/21/2021: WBC 4.4, Hgb 12.6, MCV 88.5, Plt 197  Interval History:  Rickey Hurley 55 y.o. male with medical history significant for normocytic anemia of unclear etiology who presents for a follow up visit. The patient's last visit was on 03/20/2021 with Dede Query. In the interim since the last visit he has continued vitamin b12 shots with his PCP.  On exam today Rickey Hurley notes that his hands and feet and not been as cold.  He has lost 60 pounds since he completed his bariatric surgery.  He notes that he feels a lot better and that he is not having as frequent headaches.  He is off of his blood pressure medication and hopes to get off the statins.  He notes that he is hoping discontinue everything with exception of the allopurinol.  He currently denies any shortness of breath, fatigue, or weakness.  A full 10 point ROS is listed below.  MEDICAL HISTORY:  Past Medical History:  Diagnosis Date   Anal fistula 12/21/2009   Anemia    Arthritis    Asthma    CLOSTRIDIUM DIFFICILE COLITIS 04/19/2010   COLONIC POLYPS, HX OF 02/15/4981   Complication of anesthesia    trouble keeping pt asleep.    CONJUNCTIVITIS, ACUTE 04/11/2010   GOUT 12/21/2009   H/O viral pericarditis    HYPERLIPIDEMIA 12/21/2009   HYPERTENSION 12/21/2009   PAC 12/21/2009   SINUSITIS- ACUTE-NOS 04/11/2010   Sleep apnea     SURGICAL HISTORY: Past Surgical History:  Procedure Laterality Date   anal lateral inferior sphincterotomy from c  diff     ANKLE ARTHROSCOPY Right 12/28/2020   Procedure: RIGHT ANKLE ARTHROSCOPY, DEBRIDEMENT AND EXCISION BONE SPUR;  Surgeon: Newt Minion, MD;  Location: [place];  Service: Orthopedics;  Laterality: Right;   ANKLE DEBRIDEMENT Right    right ankle ORIF      SOCIAL HISTORY: Social History   Socioeconomic History   Marital status: Single    Spouse name: Not on file   Number of children: 0   Years of education: Not on file   Highest education level: Not on file  Occupational History   Occupation: vice Press photographer: [employer]  Tobacco Use   Smoking status: Never   Smokeless tobacco: Never  Vaping Use   Vaping Use: Never used  Substance and Sexual Activity   Alcohol use: Yes    Comment: socially   Drug use: No   Sexual activity: Yes    Birth control/protection: None  Other Topics Concern   Not on file  Social History Narrative   Not on file   Social Determinants of Health   Financial Resource Strain: Not on file  Food Insecurity: Not on file  Transportation Needs: Not on file  Physical Activity: Not on file  Stress: Not on file  Social Connections: Not on file  Intimate Partner Violence: Not on file    FAMILY HISTORY: Family History  Adopted: Yes  Problem Relation Age of Onset  Cancer Neg Hx    Diabetes Neg Hx    Early death Neg Hx    Hearing loss Neg Hx    Heart disease Neg Hx    Hyperlipidemia Neg Hx    Hypertension Neg Hx    Stroke Neg Hx     ALLERGIES:  is allergic to lisinopril and moxifloxacin.  MEDICATIONS:  Current Outpatient Medications  Medication Sig Dispense Refill   alfuzosin (UROXATRAL) 10 MG 24 hr tablet Take 1 tablet (10 mg total) by mouth daily with breakfast. 90 tablet 1   allopurinol (ZYLOPRIM) 300 MG tablet TAKE ONE TABLET BY MOUTH DAILY 90 tablet 0   fenofibrate 160 MG tablet TAKE ONE TABLET BY MOUTH DAILY (Patient taking differently: Take 160 mg by mouth every evening.) 90 tablet 1   simvastatin (ZOCOR) 40 MG tablet  TAKE ONE TABLET BY MOUTH DAILY 90 tablet 0   ursodiol (ACTIGALL) 250 MG tablet Take 250 mg by mouth daily.     No current facility-administered medications for this visit.    REVIEW OF SYSTEMS:   Constitutional: ( - ) fevers, ( - )  chills , ( - ) night sweats Eyes: ( - ) blurriness of vision, ( - ) double vision, ( - ) watery eyes Ears, nose, mouth, throat, and face: ( - ) mucositis, ( - ) sore throat Respiratory: ( - ) cough, ( - ) dyspnea, ( - ) wheezes Cardiovascular: ( - ) palpitation, ( - ) chest discomfort, ( - ) lower extremity swelling Gastrointestinal:  ( - ) nausea, ( - ) heartburn, ( - ) change in bowel habits Skin: ( - ) abnormal skin rashes Lymphatics: ( - ) new lymphadenopathy, ( - ) easy bruising Neurological: ( - ) numbness, ( - ) tingling, ( - ) new weaknesses Behavioral/Psych: ( - ) mood change, ( - ) new changes  All other systems were reviewed with the patient and are negative.  PHYSICAL EXAMINATION: ECOG PERFORMANCE STATUS: 0 - Asymptomatic  Vitals:   06/21/21 1432  BP: 130/90  Pulse: 67  Resp: 20  Temp: 98.7 F (37.1 C)  SpO2: 99%   Filed Weights   06/21/21 1432  Weight: 244 lb 11.2 oz (111 kg)    GENERAL: Well-appearing middle-aged Caucasian male, alert, no distress and comfortable SKIN: skin color, texture, turgor are normal, no rashes or significant lesions EYES: conjunctiva are pink and non-injected, sclera clear LUNGS: clear to auscultation and percussion with normal breathing effort HEART: regular rate & rhythm and no murmurs and no lower extremity edema PSYCH: alert & oriented x 3, fluent speech NEURO: no focal motor/sensory deficits  LABORATORY DATA:  I have reviewed the data as listed CBC Latest Ref Rng & Units 06/21/2021 03/20/2021 03/11/2021  WBC 4.0 - 10.5 K/uL 4.4 5.8 15.9(H)  Hemoglobin 13.0 - 17.0 g/dL 12.6(L) 12.5(L) 12.5(L)  Hematocrit 39.0 - 52.0 % 36.2(L) 36.3(L) 36.2(L)  Platelets 150 - 400 K/uL 197 283 193.0    CMP Latest  Ref Rng & Units 06/21/2021 03/11/2021 12/28/2020  Glucose 70 - 99 mg/dL 89 135(H) 133(H)  BUN 6 - 20 mg/dL _0 Creatinine 0.61 - 1.24 mg/dL 0.76 1.06 0.89  Sodium 135 - 145 mmol/L 142 139 139  Potassium 3.5 - 5.1 mmol/L 4.2 3.8 3.5  Chloride 98 - 111 mmol/L 108 101 106  CO2 22 - 32 mmol/L _1 Calcium 8.9 - 10.3 mg/dL 9.4 9.3 9.3  Total Protein 6.5 - 8.1 g/dL 6.6 -  6.5  Total Bilirubin 0.3 - 1.2 mg/dL 0.6 - 0.7  Alkaline Phos 38 - 126 U/L 52 - 32(L)  AST 15 - 41 U/L 23 - 32  ALT 0 - 44 U/L 22 - 53(H)    RADIOGRAPHIC STUDIES: No results found.  ASSESSMENT & PLAN Rickey Hurley 55 y.o. male with medical history significant for normocytic anemia of unclear etiology who presents for a follow up visit.  At this time the patient has persistent normocytic anemia despite adequate vitamin B12 repletion.  His hemoglobin is at 12.6 today, stable from prior.  His CBC is otherwise normal with a normal MCV platelet count and white blood cell count.  Given these findings and the mild nature of this anemia would recommend follow-up in 3 months time in order to reassess.  No clear indication for bone marrow biopsy at this time given the mild nature of this anemia.  Encourage the patient to continue with his weight loss and continue the vitamin B12 shots with his PCP.  #Normocytic anemia: --Recently diagnosed with B12 deficiency by PCP, started on B12 injections monthly --Hemoglobin today at 12.6, unchanged from prior.  Vitamin B12 levels pending. -- Prior labs showed no other concerning nutritional deficiencies or abnormalities causing his normocytic anemia. --RTC in 3 months with repeat labs unless above workup requires further intervention.   No orders of the defined types were placed in this encounter.   All questions were answered. The patient knows to call the clinic with any problems, questions or concerns.  A total of more than 30 minutes were spent on this encounter with face-to-face  time and non-face-to-face time, including preparing to see the patient, ordering tests and/or medications, counseling the patient and coordination of care as outlined above.   Ledell Peoples, MD Department of Hematology/Oncology [facility] Phone: [phone] Pager: [phone] Email: [email or web address]  06/25/2021 2:28 PM

## 2021-06-26 ENCOUNTER — Telehealth: Payer: Self-pay | Admitting: *Deleted

## 2021-06-26 DIAGNOSIS — G4733 Obstructive sleep apnea (adult) (pediatric): Secondary | ICD-10-CM | POA: Diagnosis not present

## 2021-06-26 NOTE — Telephone Encounter (Signed)
-----   Message from Jaci Standard, MD sent at 06/26/2021  7:58 AM EDT ----- Please let Mr. Rafter know that his labs showed normal levels of vitamin b12. It is unclear why he has a mild anemia. We will see him back in Oct 2022.  ----- Message ----- From: Leory Plowman, Lab In [place] Sent: 06/21/2021   2:18 PM EDT To: Jaci Standard, MD

## 2021-06-26 NOTE — Telephone Encounter (Signed)
Received call back from pt. Advised that his results were normal except for very mild anemia-HGB  is 12.6  . Advised that we will see him back in October 2022 to recheck labs.  Pt voiced understanding.

## 2021-06-26 NOTE — Telephone Encounter (Signed)
TCT patient regarding recent lab results. No answer. Able to leave vm message for pt to return call regarding lab results at his earliest convenience.

## 2021-07-05 DIAGNOSIS — L538 Other specified erythematous conditions: Secondary | ICD-10-CM | POA: Diagnosis not present

## 2021-07-05 DIAGNOSIS — D2361 Other benign neoplasm of skin of right upper limb, including shoulder: Secondary | ICD-10-CM | POA: Diagnosis not present

## 2021-07-05 DIAGNOSIS — L821 Other seborrheic keratosis: Secondary | ICD-10-CM | POA: Diagnosis not present

## 2021-07-05 DIAGNOSIS — B353 Tinea pedis: Secondary | ICD-10-CM | POA: Diagnosis not present

## 2021-07-05 DIAGNOSIS — L905 Scar conditions and fibrosis of skin: Secondary | ICD-10-CM | POA: Diagnosis not present

## 2021-07-10 ENCOUNTER — Ambulatory Visit (INDEPENDENT_AMBULATORY_CARE_PROVIDER_SITE_OTHER): Payer: BLUE CROSS/BLUE SHIELD | Admitting: Internal Medicine

## 2021-07-10 ENCOUNTER — Other Ambulatory Visit: Payer: Self-pay

## 2021-07-10 ENCOUNTER — Encounter: Payer: Self-pay | Admitting: Internal Medicine

## 2021-07-10 VITALS — BP 134/86 | HR 70 | Temp 98.2°F | Resp 16 | Ht 71.0 in | Wt 238.0 lb

## 2021-07-10 DIAGNOSIS — R972 Elevated prostate specific antigen [PSA]: Secondary | ICD-10-CM | POA: Insufficient documentation

## 2021-07-10 DIAGNOSIS — I10 Essential (primary) hypertension: Secondary | ICD-10-CM

## 2021-07-10 DIAGNOSIS — Z903 Acquired absence of stomach [part of]: Secondary | ICD-10-CM

## 2021-07-10 DIAGNOSIS — R7303 Prediabetes: Secondary | ICD-10-CM | POA: Diagnosis not present

## 2021-07-10 DIAGNOSIS — E785 Hyperlipidemia, unspecified: Secondary | ICD-10-CM | POA: Diagnosis not present

## 2021-07-10 LAB — CBC WITH DIFFERENTIAL/PLATELET
Basophils Absolute: 0 10*3/uL (ref 0.0–0.1)
Basophils Relative: 0.8 % (ref 0.0–3.0)
Eosinophils Absolute: 0.1 10*3/uL (ref 0.0–0.7)
Eosinophils Relative: 1.3 % (ref 0.0–5.0)
HCT: 38.3 % — ABNORMAL LOW (ref 39.0–52.0)
Hemoglobin: 13 g/dL (ref 13.0–17.0)
Lymphocytes Relative: 38.3 % (ref 12.0–46.0)
Lymphs Abs: 1.8 10*3/uL (ref 0.7–4.0)
MCHC: 34 g/dL (ref 30.0–36.0)
MCV: 90.2 fl (ref 78.0–100.0)
Monocytes Absolute: 0.5 10*3/uL (ref 0.1–1.0)
Monocytes Relative: 10.2 % (ref 3.0–12.0)
Neutro Abs: 2.4 10*3/uL (ref 1.4–7.7)
Neutrophils Relative %: 49.4 % (ref 43.0–77.0)
Platelets: 204 10*3/uL (ref 150.0–400.0)
RBC: 4.24 Mil/uL (ref 4.22–5.81)
RDW: 14.7 % (ref 11.5–15.5)
WBC: 4.8 10*3/uL (ref 4.0–10.5)

## 2021-07-10 LAB — BASIC METABOLIC PANEL
BUN: 15 mg/dL (ref 6–23)
CO2: 25 mEq/L (ref 19–32)
Calcium: 9.6 mg/dL (ref 8.4–10.5)
Chloride: 105 mEq/L (ref 96–112)
Creatinine, Ser: 0.79 mg/dL (ref 0.40–1.50)
GFR: 100.77 mL/min (ref >60.00)
Glucose, Bld: 81 mg/dL (ref 70–99)
Potassium: 3.9 mEq/L (ref 3.5–5.1)
Sodium: 140 mEq/L (ref 135–145)

## 2021-07-10 LAB — LIPID PANEL
Cholesterol: 110 mg/dL (ref 0–200)
HDL: 32.3 mg/dL — ABNORMAL LOW (ref >39.00)
LDL Cholesterol: 52 mg/dL (ref 0–99)
NonHDL: 77.31
Total CHOL/HDL Ratio: 3
Triglycerides: 129 mg/dL (ref 0.0–149.0)
VLDL: 25.8 mg/dL (ref 0.0–40.0)

## 2021-07-10 LAB — HEMOGLOBIN A1C: Hgb A1c MFr Bld: 4.5 % — ABNORMAL LOW (ref 4.6–6.5)

## 2021-07-10 LAB — PSA: PSA: 1.96 ng/mL (ref 0.10–4.00)

## 2021-07-10 NOTE — Patient Instructions (Signed)

## 2021-07-10 NOTE — Progress Notes (Signed)
Subjective:  Patient ID: Rickey Hurley, male    DOB: [birth year]  Age: 56 y.o. MRN: [id number]  CC: Hypertension and Hyperlipidemia  This visit occurred during the SARS-CoV-2 public health emergency.  Safety protocols were in place, including screening questions prior to the visit, additional usage of staff PPE, and extensive cleaning of exam room while observing appropriate contact time as indicated for disinfecting solutions.    HPI Rickey Hurley presents for f/up -  He is status post bariatric surgery with gastric sleeve and duodenal bypass.  He has decided to stop taking simvastatin and fenofibrate.  He is active and denies any recent episodes of chest pain, shortness of breath, diaphoresis, dizziness, lightheadedness, or edema.  Outpatient Medications Prior to Visit  Medication Sig Dispense Refill   allopurinol (ZYLOPRIM) 300 MG tablet TAKE ONE TABLET BY MOUTH DAILY 90 tablet 0   ursodiol (ACTIGALL) 250 MG tablet Take 250 mg by mouth daily.     alfuzosin (UROXATRAL) 10 MG 24 hr tablet Take 1 tablet (10 mg total) by mouth daily with breakfast. 90 tablet 1   fenofibrate 160 MG tablet TAKE ONE TABLET BY MOUTH DAILY (Patient taking differently: Take 160 mg by mouth every evening.) 90 tablet 1   simvastatin (ZOCOR) 40 MG tablet TAKE ONE TABLET BY MOUTH DAILY 90 tablet 0   No facility-administered medications prior to visit.    ROS Review of Systems  Constitutional:  Negative for diaphoresis, fatigue and fever.  HENT: Negative.    Eyes: Negative.   Respiratory:  Negative for cough, chest tightness, shortness of breath and wheezing.   Cardiovascular:  Negative for chest pain, palpitations and leg swelling.  Gastrointestinal:  Negative for abdominal pain.  Endocrine: Negative.   Genitourinary: Negative.  Negative for difficulty urinating.  Musculoskeletal: Negative.   Skin: Negative.   Neurological:  Negative for dizziness and weakness.  Hematological: Negative.  Negative for adenopathy.  Does not bruise/bleed easily.  Psychiatric/Behavioral: Negative.     Objective:  BP 134/86 (BP Location: Left Arm, Patient Position: Sitting, Cuff Size: Large)   Pulse 70   Temp 98.2 F (36.8 C) (Oral)   Resp 16   Ht 5\' 11"  (1.803 m)   Wt 238 lb (108 kg)   SpO2 97%   BMI 33.19 kg/m   BP Readings from Last 3 Encounters:  07/10/21 134/86  06/21/21 130/90  06/18/21 127/87    Wt Readings from Last 3 Encounters:  07/10/21 238 lb (108 kg)  06/21/21 244 lb 11.2 oz (111 kg)  06/18/21 248 lb (112.5 kg)    Physical Exam Vitals reviewed.  HENT:     Mouth/Throat:     Mouth: Mucous membranes are moist.  Eyes:     Conjunctiva/sclera: Conjunctivae normal.  Cardiovascular:     Rate and Rhythm: Normal rate and regular rhythm.     Heart sounds: No murmur heard. Pulmonary:     Effort: Pulmonary effort is normal.     Breath sounds: No stridor. No wheezing, rhonchi or rales.  Abdominal:     General: Abdomen is flat. Bowel sounds are normal. There is no distension.     Palpations: Abdomen is soft. There is no hepatomegaly, splenomegaly or mass.     Tenderness: There is no abdominal tenderness.  Musculoskeletal:        General: Normal range of motion.     Cervical back: Neck supple.  Lymphadenopathy:     Cervical: No cervical adenopathy.  Skin:    General: Skin is warm.  Neurological:     General: No focal deficit present.     Mental Status: He is alert.  Psychiatric:        Mood and Affect: Mood normal.        Behavior: Behavior normal.    Lab Results  Component Value Date   WBC 4.8 07/10/2021   HGB 13.0 07/10/2021   HCT 38.3 (L) 07/10/2021   PLT 204.0 07/10/2021   GLUCOSE 81 07/10/2021   CHOL 110 07/10/2021   TRIG 129.0 07/10/2021   HDL 32.30 (L) 07/10/2021   LDLDIRECT 69.0 05/20/2018   LDLCALC 52 07/10/2021   ALT 22 06/21/2021   AST 23 06/21/2021   NA 140 07/10/2021   K 3.9 07/10/2021   CL 105 07/10/2021   CREATININE 0.79 07/10/2021   BUN 15 07/10/2021   CO2  25 07/10/2021   TSH 2.00 07/11/2019   PSA 1.96 07/10/2021   HGBA1C 4.5 (L) 07/10/2021    CT Abdomen Pelvis W Contrast  Result Date: 03/13/2021 CLINICAL DATA:  Abdominal pain. Dysuria, urinary urgency and straining. Low-grade fever. Nausea. Cloudy urine. EXAM: CT ABDOMEN AND PELVIS WITH CONTRAST TECHNIQUE: Multidetector CT imaging of the abdomen and pelvis was performed using the standard protocol following bolus administration of intravenous contrast. CONTRAST:  <See Chart> OMNIPAQUE IOHEXOL 300 MG/ML SOLN, OMNIPAQUE IOHEXOL 350 MG/ML SOLN COMPARISON:  None. FINDINGS: Lower chest: No acute abnormality. Hepatobiliary: Hepatic steatosis. No focal liver abnormality. Gallbladder appears normal. No bile duct dilatation. Pancreas: Unremarkable. No pancreatic ductal dilatation or surrounding inflammatory changes. Spleen: Normal in size without focal abnormality. Adrenals/Urinary Tract: The adrenal glands appear normal. Exophytic cyst arising off the upper pole of the left kidney measures 2.6 cm. No mass or hydronephrosis identified. Partially collapsed urinary bladder with mild circumferential wall thickening and surrounding pericystic soft tissue stranding. Stomach/Bowel: Stomach is within normal limits. Appendix appears normal. No evidence of bowel wall thickening, distention, or inflammatory changes. Vascular/Lymphatic: Aortic atherosclerosis. No aneurysm. No abdominopelvic adenopathy. Reproductive: Prostate is unremarkable. Other: No free fluid or fluid collections. Musculoskeletal: No acute or significant osseous findings. IMPRESSION: 1. There is mild circumferential wall thickening with surrounding pericystic soft tissue stranding involving the urinary bladder. Correlate for any clinical signs or symptoms of cystitis. 2. Hepatic steatosis. 3. Aortic atherosclerosis. Aortic Atherosclerosis (ICD10-I70.0). Electronically Signed   By: Signa Kell M.D.   On: 03/13/2021 13:50    Assessment & Plan:    Rickey Hurley was seen today for hypertension and hyperlipidemia.  Diagnoses and all orders for this visit:  PSA elevation- His PSA is normal. -     PSA; Future -     PSA  Essential hypertension- His BP is adequately well controlled. -     CBC with Differential/Platelet; Future -     Basic metabolic panel; Future -     Basic metabolic panel -     CBC with Differential/Platelet  Prediabetes- Improvement noted. -     Basic metabolic panel; Future -     Hemoglobin A1c; Future -     Hemoglobin A1c -     Basic metabolic panel  Hyperlipidemia with target LDL less than 130 -     Lipid panel; Future -     Lipid panel  H/O gastric sleeve  I have discontinued Rickey Hurley's fenofibrate, alfuzosin, and simvastatin. I am also having him maintain his allopurinol and ursodiol.  No orders of the defined types were placed in this encounter.    Follow-up: Return in about 3 months (around  10/10/2021).  Sanda Linger, MD

## 2021-07-12 DIAGNOSIS — Z903 Acquired absence of stomach [part of]: Secondary | ICD-10-CM | POA: Insufficient documentation

## 2021-07-25 DIAGNOSIS — Z713 Dietary counseling and surveillance: Secondary | ICD-10-CM | POA: Diagnosis not present

## 2021-07-25 DIAGNOSIS — Z9884 Bariatric surgery status: Secondary | ICD-10-CM | POA: Diagnosis not present

## 2021-09-20 ENCOUNTER — Encounter: Payer: Self-pay | Admitting: Hematology and Oncology

## 2021-09-20 ENCOUNTER — Inpatient Hospital Stay: Payer: BLUE CROSS/BLUE SHIELD | Attending: Hematology and Oncology | Admitting: Hematology and Oncology

## 2021-09-20 ENCOUNTER — Other Ambulatory Visit: Payer: Self-pay | Admitting: Hematology and Oncology

## 2021-09-20 ENCOUNTER — Inpatient Hospital Stay: Payer: BLUE CROSS/BLUE SHIELD

## 2021-09-20 ENCOUNTER — Other Ambulatory Visit: Payer: Self-pay

## 2021-09-20 VITALS — BP 132/96 | HR 87 | Temp 96.9°F | Resp 17 | Wt 220.5 lb

## 2021-09-20 DIAGNOSIS — M109 Gout, unspecified: Secondary | ICD-10-CM | POA: Diagnosis not present

## 2021-09-20 DIAGNOSIS — D649 Anemia, unspecified: Secondary | ICD-10-CM | POA: Diagnosis not present

## 2021-09-20 DIAGNOSIS — E538 Deficiency of other specified B group vitamins: Secondary | ICD-10-CM | POA: Insufficient documentation

## 2021-09-20 LAB — CBC WITH DIFFERENTIAL (CANCER CENTER ONLY)
Abs Immature Granulocytes: 0 10*3/uL (ref 0.00–0.07)
Basophils Absolute: 0.1 10*3/uL (ref 0.0–0.1)
Basophils Relative: 1 %
Eosinophils Absolute: 0.1 10*3/uL (ref 0.0–0.5)
Eosinophils Relative: 2 %
HCT: 35.3 % — ABNORMAL LOW (ref 39.0–52.0)
Hemoglobin: 12.3 g/dL — ABNORMAL LOW (ref 13.0–17.0)
Immature Granulocytes: 0 %
Lymphocytes Relative: 38 %
Lymphs Abs: 1.8 10*3/uL (ref 0.7–4.0)
MCH: 30.4 pg (ref 26.0–34.0)
MCHC: 34.8 g/dL (ref 30.0–36.0)
MCV: 87.2 fL (ref 80.0–100.0)
Monocytes Absolute: 0.4 10*3/uL (ref 0.1–1.0)
Monocytes Relative: 8 %
Neutro Abs: 2.5 10*3/uL (ref 1.7–7.7)
Neutrophils Relative %: 51 %
Platelet Count: 202 10*3/uL (ref 150–400)
RBC: 4.05 MIL/uL — ABNORMAL LOW (ref 4.22–5.81)
RDW: 13.4 % (ref 11.5–15.5)
WBC Count: 4.8 10*3/uL (ref 4.0–10.5)
nRBC: 0 % (ref 0.0–0.2)

## 2021-09-20 LAB — CMP (CANCER CENTER ONLY)
ALT: 72 U/L — ABNORMAL HIGH (ref 0–44)
AST: 35 U/L (ref 15–41)
Albumin: 4.1 g/dL (ref 3.5–5.0)
Alkaline Phosphatase: 76 U/L (ref 38–126)
Anion gap: 9 (ref 5–15)
BUN: 14 mg/dL (ref 6–20)
CO2: 25 mmol/L (ref 22–32)
Calcium: 9.5 mg/dL (ref 8.9–10.3)
Chloride: 109 mmol/L (ref 98–111)
Creatinine: 0.74 mg/dL (ref 0.61–1.24)
GFR, Estimated: >60 mL/min (ref >60)
Glucose, Bld: 109 mg/dL — ABNORMAL HIGH (ref 70–99)
Potassium: 3.9 mmol/L (ref 3.5–5.1)
Sodium: 143 mmol/L (ref 135–145)
Total Bilirubin: 0.5 mg/dL (ref 0.3–1.2)
Total Protein: 6.9 g/dL (ref 6.5–8.1)

## 2021-09-20 LAB — VITAMIN B12: Vitamin B-12: 567 pg/mL (ref 180–914)

## 2021-09-20 LAB — IRON AND TIBC
Iron: 64 ug/dL (ref 42–163)
Saturation Ratios: 22 % (ref 20–55)
TIBC: 289 ug/dL (ref 202–409)
UIBC: 225 ug/dL (ref 117–376)

## 2021-09-20 LAB — RETIC PANEL
Immature Retic Fract: 8.2 % (ref 2.3–15.9)
RBC.: 4.02 MIL/uL — ABNORMAL LOW (ref 4.22–5.81)
Retic Count, Absolute: 42.6 10*3/uL (ref 19.0–186.0)
Retic Ct Pct: 1.1 % (ref 0.4–3.1)
Reticulocyte Hemoglobin: 34.5 pg (ref >27.9)

## 2021-09-20 LAB — FERRITIN: Ferritin: 66 ng/mL (ref 24–336)

## 2021-09-20 NOTE — Progress Notes (Signed)
[facility] [phone]   [phone]  PROGRESS NOTE  Patient Care Team: Janith Lima, MD as PCP - General (Internal Medicine)  Hematological/Oncological History # Normocytic Anemia 1) Labs from Dr. Scarlette Calico, PCP: -02/08/2021: WBC 7.4, Hgb 12.7 (L), MCV 90.6, Plt 259, LDH 133, Haptoglobin 183, Folate >23.6, Ferritin 123.2, Vitamin B12 230, Iron 79 -03/11/2021: WBC 15.9 (H), Hgb 12.5, (L), MCV 87.9, Plt 193  2) Establish care with Dede Query PA-C 3) 06/21/2021: WBC 4.4, Hgb 12.6, MCV 88.5, Plt 197 4) 09/20/2021: WBC 4.8, Hgb 12.3, MCV 87.2, Plt 202  Interval History:  Rickey Hurley 55 y.o. male with medical history significant for normocytic anemia of unclear etiology who presents for a follow up visit. The patient's last visit was on 06/21/2021. In the interim since the last visit his hemoglobin and symptoms normalized, but are now worsening.  On exam today Rickey Hurley notes he felt symptomatically better when his hemoglobin was up to 13.0.  He reports that recently he has felt his hands and feet becoming more cold and has been developing worsening issues.  Shortness of breath went away but is now returning.  He also notes his hands become so cold he has to sit on them and "my dog noticed".  He notes that he does have gout but has had no recent active flares.  He does not have any joint issues, rashes, or other signs of inflammation.  He has had no signs of recurrent infection since his E. coli joint infection in January 2022.  He currently denies any fevers, sweats, nausea, vomiting, or diarrhea.  A full 10 point ROS is listed below.  MEDICAL HISTORY:  Past Medical History:  Diagnosis Date   Anal fistula 12/21/2009   Anemia    Arthritis    Asthma    CLOSTRIDIUM DIFFICILE COLITIS 04/19/2010   COLONIC POLYPS, HX OF 4/54/0981   Complication of anesthesia    trouble keeping pt asleep.    CONJUNCTIVITIS, ACUTE 04/11/2010   GOUT 12/21/2009   H/O viral  pericarditis    HYPERLIPIDEMIA 12/21/2009   HYPERTENSION 12/21/2009   PAC 12/21/2009   SINUSITIS- ACUTE-NOS 04/11/2010   Sleep apnea     SURGICAL HISTORY: Past Surgical History:  Procedure Laterality Date   anal lateral inferior sphincterotomy from c diff     ANKLE ARTHROSCOPY Right 12/28/2020   Procedure: RIGHT ANKLE ARTHROSCOPY, DEBRIDEMENT AND EXCISION BONE SPUR;  Surgeon: Newt Minion, MD;  Location: [facility];  Service: Orthopedics;  Laterality: Right;   ANKLE DEBRIDEMENT Right    right ankle ORIF      SOCIAL HISTORY: Social History   Socioeconomic History   Marital status: Single    Spouse name: Not on file   Number of children: 0   Years of education: Not on file   Highest education level: Not on file  Occupational History   Occupation: vice Press photographer: [employer]  Tobacco Use   Smoking status: Never   Smokeless tobacco: Never  Vaping Use   Vaping Use: Never used  Substance and Sexual Activity   Alcohol use: Yes    Comment: socially   Drug use: No   Sexual activity: Yes    Birth control/protection: None  Other Topics Concern   Not on file  Social History Narrative   Not on file   Social Determinants of Health   Financial Resource Strain: Not on file  Food Insecurity: Not on file  Transportation Needs:  Not on file  Physical Activity: Not on file  Stress: Not on file  Social Connections: Not on file  Intimate Partner Violence: Not on file    FAMILY HISTORY: Family History  Adopted: Yes  Problem Relation Age of Onset   Cancer Neg Hx    Diabetes Neg Hx    Early death Neg Hx    Hearing loss Neg Hx    Heart disease Neg Hx    Hyperlipidemia Neg Hx    Hypertension Neg Hx    Stroke Neg Hx     ALLERGIES:  is allergic to lisinopril and moxifloxacin.  MEDICATIONS:  Current Outpatient Medications  Medication Sig Dispense Refill   allopurinol (ZYLOPRIM) 300 MG tablet TAKE ONE TABLET BY MOUTH DAILY 90 tablet 0   No current  facility-administered medications for this visit.    REVIEW OF SYSTEMS:   Constitutional: ( - ) fevers, ( - )  chills , ( - ) night sweats Eyes: ( - ) blurriness of vision, ( - ) double vision, ( - ) watery eyes Ears, nose, mouth, throat, and face: ( - ) mucositis, ( - ) sore throat Respiratory: ( - ) cough, ( - ) dyspnea, ( - ) wheezes Cardiovascular: ( - ) palpitation, ( - ) chest discomfort, ( - ) lower extremity swelling Gastrointestinal:  ( - ) nausea, ( - ) heartburn, ( - ) change in bowel habits Skin: ( - ) abnormal skin rashes Lymphatics: ( - ) new lymphadenopathy, ( - ) easy bruising Neurological: ( - ) numbness, ( - ) tingling, ( - ) new weaknesses Behavioral/Psych: ( - ) mood change, ( - ) new changes  All other systems were reviewed with the patient and are negative.  PHYSICAL EXAMINATION: ECOG PERFORMANCE STATUS: 0 - Asymptomatic  Vitals:   09/20/21 1425  BP: (!) 132/96  Pulse: 87  Resp: 17  Temp: (!) 96.9 F (36.1 C)  SpO2: 97%    Filed Weights   09/20/21 1425  Weight: 220 lb 8 oz (100 kg)     GENERAL: Well-appearing middle-aged Caucasian male, alert, no distress and comfortable SKIN: skin color, texture, turgor are normal, no rashes or significant lesions EYES: conjunctiva are pink and non-injected, sclera clear LUNGS: clear to auscultation and percussion with normal breathing effort HEART: regular rate & rhythm and no murmurs and no lower extremity edema PSYCH: alert & oriented x 3, fluent speech NEURO: no focal motor/sensory deficits  LABORATORY DATA:  I have reviewed the data as listed CBC Latest Ref Rng & Units 09/20/2021 07/10/2021 06/21/2021  WBC 4.0 - 10.5 K/uL 4.8 4.8 4.4  Hemoglobin 13.0 - 17.0 g/dL 12.3(L) 13.0 12.6(L)  Hematocrit 39.0 - 52.0 % 35.3(L) 38.3(L) 36.2(L)  Platelets 150 - 400 K/uL 202 204.0 197    CMP Latest Ref Rng & Units 09/20/2021 07/10/2021 06/21/2021  Glucose 70 - 99 mg/dL 109(H) 81 89  BUN 6 - 20 mg/dL 14 15 16    Creatinine 0.61 - 1.24 mg/dL 0.74 0.79 0.76  Sodium 135 - 145 mmol/L 143 140 142  Potassium 3.5 - 5.1 mmol/L 3.9 3.9 4.2  Chloride 98 - 111 mmol/L 109 105 108  CO2 22 - 32 mmol/L 25 25 26   Calcium 8.9 - 10.3 mg/dL 9.5 9.6 9.4  Total Protein 6.5 - 8.1 g/dL 6.9 - 6.6  Total Bilirubin 0.3 - 1.2 mg/dL 0.5 - 0.6  Alkaline Phos 38 - 126 U/L 76 - 52  AST 15 - 41 U/L 35 -  23  ALT 0 - 44 U/L 72(H) - 22    RADIOGRAPHIC STUDIES: No results found.  ASSESSMENT & PLAN Rickey Hurley 55 y.o. male with medical history significant for normocytic anemia of unclear etiology who presents for a follow up visit.  At this time the patient has persistent normocytic anemia despite adequate vitamin B12 repletion.  His hemoglobin is at 12.3 today, stable from prior.  His CBC is otherwise normal with a normal MCV platelet count and white blood cell count.    Today I discussed with Mr. Chrostowski that his anemia has remained persistent without a clear etiology.  We have ordered an extensive set of lab work including nutritional studies, lysis labs, SPEP, reticulocyte panel, and peripheral blood film.  So far no clear etiology has been discerned.  The patient does have some concerning symptoms including feeling cold, numbness of the hands and feet.  I noted with him that bone marrow biopsy may reveal etiology but would likely be low yield in this setting of a mild isolated normocytic anemia.  Patient noted he would like to pursue a bone marrow biopsy.  We will have this arranged.  #Normocytic anemia: --Recently diagnosed with B12 deficiency by PCP, started on B12 injections monthly --Hemoglobin today at 12.3, stable from prior.  Vitamin B12 levels WNL. -- Prior labs showed no other concerning nutritional deficiencies or abnormalities causing his normocytic anemia. --We will order a CT-guided bone marrow biopsy to further assess. --RTC in 6 months with repeat labs unless above workup requires further intervention.   Orders  Placed This Encounter  Procedures   CT BIOPSY    Standing Status:   Future    Standing Expiration Date:   09/20/2022    Order Specific Question:   Lab orders requested (DO NOT place separate lab orders, these will be automatically ordered during procedure specimen collection):    Answer:   Surgical Pathology    Order Specific Question:   Reason for Exam (SYMPTOM  OR DIAGNOSIS REQUIRED)    Answer:   anemia of unclear etiology, requesting bone marrow biopsy    Order Specific Question:   Preferred location?    Answer:   [facility]   CT BONE MARROW BIOPSY & ASPIRATION    Standing Status:   Future    Standing Expiration Date:   09/20/2022    Order Specific Question:   Reason for Exam (SYMPTOM  OR DIAGNOSIS REQUIRED)    Answer:   anemia of unclear etiology, requesting bone marrow biopsy    Order Specific Question:   Preferred location?    Answer:   [facility]   Vitamin B12    Standing Status:   Future    Number of Occurrences:   1    Standing Expiration Date:   09/20/2022   Methylmalonic acid, serum    Standing Status:   Future    Number of Occurrences:   1    Standing Expiration Date:   09/20/2022     All questions were answered. The patient knows to call the clinic with any problems, questions or concerns.  A total of more than 30 minutes were spent on this encounter with face-to-face time and non-face-to-face time, including preparing to see the patient, ordering tests and/or medications, counseling the patient and coordination of care as outlined above.   Ledell Peoples, MD Department of Hematology/Oncology [place] [facility] Phone: [phone] Pager: [phone] Email: [email or web address]  09/20/2021 4:47 PM

## 2021-09-24 DIAGNOSIS — G4733 Obstructive sleep apnea (adult) (pediatric): Secondary | ICD-10-CM | POA: Diagnosis not present

## 2021-09-26 LAB — METHYLMALONIC ACID, SERUM: Methylmalonic Acid, Quantitative: 147 nmol/L (ref 0–378)

## 2021-10-21 NOTE — H&P (Signed)
Chief Complaint: Patient was seen in consultation today for image guided bone marrow biopsy and aspiration at the request of [place] T IV  Referring Physician(s): Dorsey,John T IV  Supervising Physician: Arne Cleveland  Patient Status: [facility] - Out-pt  History of Present Illness: Rickey Hurley is a 55 y.o. male with PMH of anemia, asthma, C. difficile, gout, viral pericarditis, HLD, HTN and sleep apnea.  Patient has been followed since March 2022 for normocytic anemia of unclear ideology. Patient was referred by Dr. Narda Rutherford to IR for bone marrow biopsy.   Past Medical History:  Diagnosis Date   Anal fistula 12/21/2009   Anemia    Arthritis    Asthma    CLOSTRIDIUM DIFFICILE COLITIS 04/19/2010   COLONIC POLYPS, HX OF 4/59/9774   Complication of anesthesia    trouble keeping pt asleep.    CONJUNCTIVITIS, ACUTE 04/11/2010   GOUT 12/21/2009   H/O viral pericarditis    HYPERLIPIDEMIA 12/21/2009   HYPERTENSION 12/21/2009   PAC 12/21/2009   SINUSITIS- ACUTE-NOS 04/11/2010   Sleep apnea     Past Surgical History:  Procedure Laterality Date   anal lateral inferior sphincterotomy from c diff     ANKLE ARTHROSCOPY Right 12/28/2020   Procedure: RIGHT ANKLE ARTHROSCOPY, DEBRIDEMENT AND EXCISION BONE SPUR;  Surgeon: Newt Minion, MD;  Location: [facility];  Service: Orthopedics;  Laterality: Right;   ANKLE DEBRIDEMENT Right    right ankle ORIF      Allergies: Lisinopril and Moxifloxacin  Medications: Prior to Admission medications   Medication Sig Start Date End Date Taking? Authorizing Provider  allopurinol (ZYLOPRIM) 300 MG tablet TAKE ONE TABLET BY MOUTH DAILY 05/15/21   Janith Lima, MD     Family History  Adopted: Yes  Problem Relation Age of Onset   Cancer Neg Hx    Diabetes Neg Hx    Early death Neg Hx    Hearing loss Neg Hx    Heart disease Neg Hx    Hyperlipidemia Neg Hx    Hypertension Neg Hx    Stroke Neg Hx     Social History   Socioeconomic History    Marital status: Single    Spouse name: Not on file   Number of children: 0   Years of education: Not on file   Highest education level: Not on file  Occupational History   Occupation: vice Press photographer: [employer]  Tobacco Use   Smoking status: Never   Smokeless tobacco: Never  Vaping Use   Vaping Use: Never used  Substance and Sexual Activity   Alcohol use: Yes    Comment: socially   Drug use: No   Sexual activity: Yes    Birth control/protection: None  Other Topics Concern   Not on file  Social History Narrative   Not on file   Social Determinants of Health   Financial Resource Strain: Not on file  Food Insecurity: Not on file  Transportation Needs: Not on file  Physical Activity: Not on file  Stress: Not on file  Social Connections: Not on file    Review of Systems: A 12 point ROS discussed and pertinent positives are indicated in the HPI above.  All other systems are negative.  Review of Systems  Constitutional:  Negative for chills and fever.  HENT:  Negative for nosebleeds.   Eyes:  Negative for visual disturbance.  Respiratory:  Negative for cough and shortness of breath.   Cardiovascular:  Negative for  chest pain and leg swelling.  Gastrointestinal:  Negative for abdominal pain, blood in stool, nausea and vomiting.  Genitourinary:  Negative for hematuria.  Neurological:  Negative for dizziness, light-headedness and headaches.   Vital Signs: BP (!) 154/98   Pulse 61   Temp 98.2 F (36.8 C) (Oral)   Resp 16   Ht _0  (1.803 m)   Wt 210 lb (95.3 kg)   SpO2 100%   BMI 29.29 kg/m   Physical Exam Constitutional:      Appearance: Normal appearance. He is not ill-appearing.  HENT:     Head: Normocephalic and atraumatic.     Mouth/Throat:     Mouth: Mucous membranes are moist.     Pharynx: Oropharynx is clear.  Cardiovascular:     Rate and Rhythm: Regular rhythm. Bradycardia present.     Pulses: Normal pulses.     Heart sounds:  Normal heart sounds. No murmur heard.   No friction rub. No gallop.  Pulmonary:     Effort: Pulmonary effort is normal. No respiratory distress.     Breath sounds: Normal breath sounds. No stridor. No wheezing, rhonchi or rales.  Abdominal:     General: Bowel sounds are normal. There is no distension.     Tenderness: There is no abdominal tenderness. There is no guarding.  Musculoskeletal:     Right lower leg: No edema.     Left lower leg: No edema.  Skin:    General: Skin is warm and dry.  Neurological:     Mental Status: He is alert and oriented to person, place, and time.  Psychiatric:        Mood and Affect: Mood normal.        Behavior: Behavior normal.        Thought Content: Thought content normal.        Judgment: Judgment normal.    Imaging: No results found.  Labs:  CBC: Recent Labs    06/21/21 1405 07/10/21 1608 09/20/21 1410 10/22/21 1005  WBC 4.4 4.8 4.8 4.7  HGB 12.6* 13.0 12.3* 13.5  HCT 36.2* 38.3* 35.3* 39.5  PLT 197 204.0 202 204    COAGS: No results for input(s): INR, APTT in the last 8760 hours.  BMP: Recent Labs    12/28/20 0856 03/11/21 1550 06/21/21 1405 07/10/21 1608 09/20/21 1410  NA 139 139 142 140 143  K 3.5 3.8 4.2 3.9 3.9  CL 106 101 108 105 109  CO2 _1 GLUCOSE 133* 135* 89 81 109*  BUN _2 CALCIUM 9.3 9.3 9.4 9.6 9.5  CREATININE 0.89 1.06 0.76 0.79 0.74  GFRNONAA >60  --  >60  --  >60    LIVER FUNCTION TESTS: Recent Labs    12/28/20 0856 06/21/21 1405 09/20/21 1410  BILITOT 0.7 0.6 0.5  AST 32 23 35  ALT 53* 22 72*  ALKPHOS 32* 52 76  PROT 6.5 6.6 6.9  ALBUMIN 3.9 3.9 4.1    TUMOR MARKERS: No results for input(s): AFPTM, CEA, CA199, CHROMGRNA in the last 8760 hours.  Assessment and Plan: History of anemia, asthma, C. difficile, gout, viral pericarditis, HLD, HTN and sleep apnea.  Patient has been followed since March 2022 for normocytic anemia of unclear ideology. Patient was  referred by Dr. Narda Rutherford to IR for bone marrow biopsy.   Pt sitting upright in bed watching TV. He is A&O, calm and pleasant. He is in no distress.  Pt states he is NPO per order Today's labs pending VSS  Risks and benefits of bone marrow biopsy and aspiration was discussed with the patient and/or patient's family including, but not limited to bleeding, infection, damage to adjacent structures or low yield requiring additional tests.  All of the questions were answered and there is agreement to proceed.  Consent signed and in chart.   Thank you for this interesting consult.  I greatly enjoyed meeting Johncarlos Holtsclaw and look forward to participating in their care.  A copy of this report was sent to the requesting provider on this date.  Electronically Signed: Tyson Alias, NP 10/22/2021, 10:44 AM   I spent a total of 30 minutes in face to face in clinical consultation, greater than 50% of which was counseling/coordinating care for much guided bone marrow biopsy and aspiration.

## 2021-10-22 ENCOUNTER — Other Ambulatory Visit (HOSPITAL_COMMUNITY): Payer: Self-pay | Admitting: Physician Assistant

## 2021-10-22 ENCOUNTER — Ambulatory Visit (HOSPITAL_COMMUNITY)
Admission: RE | Admit: 2021-10-22 | Discharge: 2021-10-22 | Disposition: A | Discharge status: Home / Self Care | Payer: BLUE CROSS/BLUE SHIELD | Source: Ambulatory Visit | Attending: Hematology and Oncology | Admitting: Hematology and Oncology

## 2021-10-22 ENCOUNTER — Encounter (HOSPITAL_COMMUNITY): Payer: Self-pay

## 2021-10-22 ENCOUNTER — Other Ambulatory Visit: Payer: Self-pay

## 2021-10-22 DIAGNOSIS — M109 Gout, unspecified: Secondary | ICD-10-CM | POA: Insufficient documentation

## 2021-10-22 DIAGNOSIS — I10 Essential (primary) hypertension: Secondary | ICD-10-CM | POA: Diagnosis not present

## 2021-10-22 DIAGNOSIS — G4730 Sleep apnea, unspecified: Secondary | ICD-10-CM | POA: Diagnosis not present

## 2021-10-22 DIAGNOSIS — E785 Hyperlipidemia, unspecified: Secondary | ICD-10-CM | POA: Insufficient documentation

## 2021-10-22 DIAGNOSIS — D539 Nutritional anemia, unspecified: Secondary | ICD-10-CM | POA: Diagnosis not present

## 2021-10-22 DIAGNOSIS — D649 Anemia, unspecified: Secondary | ICD-10-CM | POA: Insufficient documentation

## 2021-10-22 DIAGNOSIS — J45909 Unspecified asthma, uncomplicated: Secondary | ICD-10-CM | POA: Insufficient documentation

## 2021-10-22 LAB — CBC WITH DIFFERENTIAL/PLATELET
Abs Immature Granulocytes: 0.01 10*3/uL (ref 0.00–0.07)
Basophils Absolute: 0 10*3/uL (ref 0.0–0.1)
Basophils Relative: 0 %
Eosinophils Absolute: 0.1 10*3/uL (ref 0.0–0.5)
Eosinophils Relative: 2 %
HCT: 39.5 % (ref 39.0–52.0)
Hemoglobin: 13.5 g/dL (ref 13.0–17.0)
Immature Granulocytes: 0 %
Lymphocytes Relative: 44 %
Lymphs Abs: 2 10*3/uL (ref 0.7–4.0)
MCH: 30.1 pg (ref 26.0–34.0)
MCHC: 34.2 g/dL (ref 30.0–36.0)
MCV: 88.2 fL (ref 80.0–100.0)
Monocytes Absolute: 0.5 10*3/uL (ref 0.1–1.0)
Monocytes Relative: 10 %
Neutro Abs: 2.1 10*3/uL (ref 1.7–7.7)
Neutrophils Relative %: 44 %
Platelets: 204 10*3/uL (ref 150–400)
RBC: 4.48 MIL/uL (ref 4.22–5.81)
RDW: 13.3 % (ref 11.5–15.5)
WBC: 4.7 10*3/uL (ref 4.0–10.5)
nRBC: 0 % (ref 0.0–0.2)

## 2021-10-22 MED ORDER — SODIUM CHLORIDE 0.9 % IV SOLN: INTRAVENOUS | Status: DC

## 2021-10-22 MED ORDER — MIDAZOLAM HCL 2 MG/2ML IJ SOLN
INTRAMUSCULAR | Status: AC | PRN
  Administered 2021-10-22 (×2): 1 mg via INTRAVENOUS
  Administered 2021-10-22: 2 mg via INTRAVENOUS

## 2021-10-22 MED ORDER — FENTANYL CITRATE (PF) 100 MCG/2ML IJ SOLN
INTRAMUSCULAR | Status: AC | PRN
  Administered 2021-10-22 (×2): 50 ug via INTRAVENOUS

## 2021-10-22 MED ORDER — HYDROCODONE-ACETAMINOPHEN 5-325 MG PO TABS: 1.0000 | ORAL_TABLET | ORAL | Status: DC | PRN

## 2021-10-22 MED ORDER — LIDOCAINE HCL (PF) 1 % IJ SOLN
INTRAMUSCULAR | Status: AC | PRN
  Administered 2021-10-22: 20 mL

## 2021-10-22 NOTE — Discharge Instructions (Signed)
For questions /concerns may call Interventional Radiology at [phone] ° °You may remove your dressing and shower tomorrow afternoon ° ° ° °Bone Marrow Aspiration and Bone Marrow Biopsy, Adult, Care After °This sheet gives you information about how to care for yourself after your procedure. Your health care provider may also give you more specific instructions. If you have problems or questions, contact your health careprovider. °What can I expect after the procedure? °After the procedure, it is common to have: °Mild pain and tenderness. °Swelling. °Bruising. °Follow these instructions at home: °Puncture site care °Follow instructions from your health care provider about how to take care of the puncture site. Make sure you: °Wash your hands with soap and water before and after you change your bandage (dressing). If soap and water are not available, use hand sanitizer. °Change your dressing as told by your health care provider. °Check your puncture site every day for signs of infection. Check for: °More redness, swelling, or pain. °Fluid or blood. °Warmth. °Pus or a bad smell. ° °Activity °Return to your normal activities as told by your health care provider. Ask your health care provider what activities are safe for you. °Do not lift anything that is heavier than 10 lb (4.5 kg), or the limit that you are told, until your health care provider says that it is safe. °Do not drive for 24 hours if you were given a sedative during your procedure. °General instructions °Take over-the-counter and prescription medicines only as told by your health care provider. °Do not take baths, swim, or use a hot tub until your health care provider approves. Ask your health care provider if you may take showers. You may only be allowed to take sponge baths. °If directed, put ice on the affected area. To do this: °Put ice in a plastic bag. °Place a towel between your skin and the bag. °Leave the ice on for 20 minutes, 2-3 times a  day. °Keep all follow-up visits as told by your health care provider. This is important. ° °Contact a health care provider if: °Your pain is not controlled with medicine. °You have a fever. °You have more redness, swelling, or pain around the puncture site. °You have fluid or blood coming from the puncture site. °Your puncture site feels warm to the touch. °You have pus or a bad smell coming from the puncture site. °Summary °After the procedure, it is common to have mild pain, tenderness, swelling, and bruising. °Follow instructions from your health care provider about how to take care of the puncture site and what activities are safe for you. °Take over-the-counter and prescription medicines only as told by your health care provider. °Contact a health care provider if you have any signs of infection, such as fluid or blood coming from the puncture site. °This information is not intended to replace advice given to you by your health care provider. Make sure you discuss any questions you have with your healthcare provider. ° ° °Moderate Conscious Sedation, Adult, Care After °This sheet gives you information about how to care for yourself after your procedure. Your health care provider may also give you more specific instructions. If you have problems or questions, contact your health careprovider. °What can I expect after the procedure? °After the procedure, it is common to have: °Sleepiness for several hours. °Impaired judgment for several hours. °Difficulty with balance. °Vomiting if you eat too soon. °Follow these instructions at home: °For the time period you were told by your health care provider: °  Rest. °Do not participate in activities where you could fall or become injured. °Do not drive or use machinery. °Do not drink alcohol. °Do not take sleeping pills or medicines that cause drowsiness. °Do not make important decisions or sign legal documents. °Do not take care of children on your own. °Eating and  drinking ° °Follow the diet recommended by your health care provider. °Drink enough fluid to keep your urine pale yellow. °If you vomit: °Drink water, juice, or soup when you can drink without vomiting. °Make sure you have little or no nausea before eating solid foods. ° °General instructions °Take over-the-counter and prescription medicines only as told by your health care provider. °Have a responsible adult stay with you for the time you are told. It is important to have someone help care for you until you are awake and alert. °Do not smoke. °Keep all follow-up visits as told by your health care provider. This is important. °Contact a health care provider if: °You are still sleepy or having trouble with balance after 24 hours. °You feel light-headed. °You keep feeling nauseous or you keep vomiting. °You develop a rash. °You have a fever. °You have redness or swelling around the IV site. °Get help right away if: °You have trouble breathing. °You have new-onset confusion at home. °Summary °After the procedure, it is common to feel sleepy, have impaired judgment, or feel nauseous if you eat too soon. °Rest after you get home. Know the things you should not do after the procedure. °Follow the diet recommended by your health care provider and drink enough fluid to keep your urine pale yellow. °Get help right away if you have trouble breathing or new-onset confusion at home. °This information is not intended to replace advice given to you by your health care provider. Make sure you discuss any questions you have with your healthcare provider. °Document Revised: 03/09/2020 Document Reviewed: 10/06/2019 °Elsevier Patient Education © 2022 [employer]  °

## 2021-10-22 NOTE — Procedures (Signed)
  Procedure: CT bone marrow biopsy R iliac EBL:   minimal Complications:  none immediate  See full dictation in [facility].  Dillard Cannon MD Main # [phone] Pager  [phone] Mobile [phone]

## 2021-10-24 DIAGNOSIS — G4733 Obstructive sleep apnea (adult) (pediatric): Secondary | ICD-10-CM | POA: Diagnosis not present

## 2021-10-28 ENCOUNTER — Inpatient Hospital Stay: Payer: BLUE CROSS/BLUE SHIELD | Attending: Hematology and Oncology | Admitting: Hematology and Oncology

## 2021-10-28 ENCOUNTER — Other Ambulatory Visit: Payer: Self-pay

## 2021-10-28 ENCOUNTER — Inpatient Hospital Stay: Payer: BLUE CROSS/BLUE SHIELD

## 2021-10-28 VITALS — BP 135/87 | HR 60 | Temp 97.7°F | Resp 18 | Ht 71.0 in | Wt 220.8 lb

## 2021-10-28 DIAGNOSIS — D649 Anemia, unspecified: Secondary | ICD-10-CM | POA: Diagnosis not present

## 2021-10-28 DIAGNOSIS — Z9884 Bariatric surgery status: Secondary | ICD-10-CM | POA: Diagnosis not present

## 2021-10-28 DIAGNOSIS — E538 Deficiency of other specified B group vitamins: Secondary | ICD-10-CM | POA: Insufficient documentation

## 2021-10-28 DIAGNOSIS — Z713 Dietary counseling and surveillance: Secondary | ICD-10-CM | POA: Diagnosis not present

## 2021-10-28 NOTE — Progress Notes (Signed)
[place] Telephone:(336) [phone]   [phone]  PROGRESS NOTE  Patient Care Team: Janith Lima, MD as PCP - General (Internal Medicine)  Hematological/Oncological History # Normocytic Anemia 1) Labs from Dr. Scarlette Calico, PCP: -02/08/2021: WBC 7.4, Hgb 12.7 (L), MCV 90.6, Plt 259, LDH 133, Haptoglobin 183, Folate >23.6, Ferritin 123.2, Vitamin B12 230, Iron 79 -03/11/2021: WBC 15.9 (H), Hgb 12.5, (L), MCV 87.9, Plt 193  2) Establish care with Dede Query PA-C 3) 06/21/2021: WBC 4.4, Hgb 12.6, MCV 88.5, Plt 197 4) 09/20/2021: WBC 4.8, Hgb 12.3, MCV 87.2, Plt 202 5) 10/22/2021: Bone marrow biopsy performed, found to be normocellular with no clear abnormalities.  Interval History:  Rickey Hurley 55 y.o. male with medical history significant for normocytic anemia of unclear etiology who presents for a follow up visit. The patient's last visit was on 09/20/2021. In the interim since the last visit his hemoglobin has normalized, but his symptoms persist.  Bone marrow biopsy was performed with no clear etiologies.  On exam today Rickey Hurley notes the bone marrow biopsy procedure went well.  He notes that he was partially sedated during the procedure and did have some pain and pressure during the actual biopsy.  He reports that he did feel good on the day of the procedure and was not surprised to find out that his hemoglobin was 13.5 at the time.  He notes he continues to feel fluctuations in his energy levels and his symptoms.  He currently denies any overt signs of bleeding or blood in the urine/stool.  He currently denies any fevers, sweats, nausea, vomiting, or diarrhea.  A full 10 point ROS is listed below.  MEDICAL HISTORY:  Past Medical History:  Diagnosis Date   Anal fistula 12/21/2009   Anemia    Arthritis    Asthma    CLOSTRIDIUM DIFFICILE COLITIS 04/19/2010   COLONIC POLYPS, HX OF 7/98/9211   Complication of anesthesia    trouble keeping pt asleep.     CONJUNCTIVITIS, ACUTE 04/11/2010   GOUT 12/21/2009   H/O viral pericarditis    HYPERLIPIDEMIA 12/21/2009   HYPERTENSION 12/21/2009   PAC 12/21/2009   SINUSITIS- ACUTE-NOS 04/11/2010   Sleep apnea     SURGICAL HISTORY: Past Surgical History:  Procedure Laterality Date   anal lateral inferior sphincterotomy from c diff     ANKLE ARTHROSCOPY Right 12/28/2020   Procedure: RIGHT ANKLE ARTHROSCOPY, DEBRIDEMENT AND EXCISION BONE SPUR;  Surgeon: Newt Minion, MD;  Location: [facility];  Service: Orthopedics;  Laterality: Right;   ANKLE DEBRIDEMENT Right    right ankle ORIF      SOCIAL HISTORY: Social History   Socioeconomic History   Marital status: Single    Spouse name: Not on file   Number of children: 0   Years of education: Not on file   Highest education level: Not on file  Occupational History   Occupation: vice Press photographer: [employer]  Tobacco Use   Smoking status: Never   Smokeless tobacco: Never  Vaping Use   Vaping Use: Never used  Substance and Sexual Activity   Alcohol use: Yes    Comment: socially   Drug use: No   Sexual activity: Yes    Birth control/protection: None  Other Topics Concern   Not on file  Social History Narrative   Not on file   Social Determinants of Health   Financial Resource Strain: Not on file  Food Insecurity: Not on file  Transportation Needs: Not on file  Physical Activity: Not on file  Stress: Not on file  Social Connections: Not on file  Intimate Partner Violence: Not on file    FAMILY HISTORY: Family History  Adopted: Yes  Problem Relation Age of Onset   Cancer Neg Hx    Diabetes Neg Hx    Early death Neg Hx    Hearing loss Neg Hx    Heart disease Neg Hx    Hyperlipidemia Neg Hx    Hypertension Neg Hx    Stroke Neg Hx     ALLERGIES:  is allergic to lisinopril and moxifloxacin.  MEDICATIONS:  Current Outpatient Medications  Medication Sig Dispense Refill   allopurinol (ZYLOPRIM) 300 MG tablet TAKE ONE  TABLET BY MOUTH DAILY 90 tablet 0   Multiple Vitamin (MULTIVITAMIN ADULT PO) Multivitamin     Omega-3 Fatty Acids (FISH OIL OMEGA-3 PO) 1,400 mg.     No current facility-administered medications for this visit.    REVIEW OF SYSTEMS:   Constitutional: ( - ) fevers, ( - )  chills , ( - ) night sweats Eyes: ( - ) blurriness of vision, ( - ) double vision, ( - ) watery eyes Ears, nose, mouth, throat, and face: ( - ) mucositis, ( - ) sore throat Respiratory: ( - ) cough, ( - ) dyspnea, ( - ) wheezes Cardiovascular: ( - ) palpitation, ( - ) chest discomfort, ( - ) lower extremity swelling Gastrointestinal:  ( - ) nausea, ( - ) heartburn, ( - ) change in bowel habits Skin: ( - ) abnormal skin rashes Lymphatics: ( - ) new lymphadenopathy, ( - ) easy bruising Neurological: ( - ) numbness, ( - ) tingling, ( - ) new weaknesses Behavioral/Psych: ( - ) mood change, ( - ) new changes  All other systems were reviewed with the patient and are negative.  PHYSICAL EXAMINATION: ECOG PERFORMANCE STATUS: 0 - Asymptomatic  Vitals:   10/28/21 1346  BP: 135/87  Pulse: 60  Resp: 18  Temp: 97.7 F (36.5 C)  SpO2: 100%    Filed Weights   10/28/21 1346  Weight: 220 lb 12.8 oz (100.2 kg)     GENERAL: Well-appearing middle-aged Caucasian male, alert, no distress and comfortable SKIN: skin color, texture, turgor are normal, no rashes or significant lesions EYES: conjunctiva are pink and non-injected, sclera clear LUNGS: clear to auscultation and percussion with normal breathing effort HEART: regular rate & rhythm and no murmurs and no lower extremity edema PSYCH: alert & oriented x 3, fluent speech NEURO: no focal motor/sensory deficits  LABORATORY DATA:  I have reviewed the data as listed CBC Latest Ref Rng & Units 10/22/2021 09/20/2021 07/10/2021  WBC 4.0 - 10.5 K/uL 4.7 4.8 4.8  Hemoglobin 13.0 - 17.0 g/dL 13.5 12.3(L) 13.0  Hematocrit 39.0 - 52.0 % 39.5 35.3(L) 38.3(L)  Platelets 150 - 400  K/uL 204 202 204.0    CMP Latest Ref Rng & Units 09/20/2021 07/10/2021 06/21/2021  Glucose 70 - 99 mg/dL 109(H) 81 89  BUN 6 - 20 mg/dL 14 15 16   Creatinine 0.61 - 1.24 mg/dL 0.74 0.79 0.76  Sodium 135 - 145 mmol/L 143 140 142  Potassium 3.5 - 5.1 mmol/L 3.9 3.9 4.2  Chloride 98 - 111 mmol/L 109 105 108  CO2 22 - 32 mmol/L 25 25 26   Calcium 8.9 - 10.3 mg/dL 9.5 9.6 9.4  Total Protein 6.5 - 8.1 g/dL 6.9 - 6.6  Total Bilirubin 0.3 -  1.2 mg/dL 0.5 - 0.6  Alkaline Phos 38 - 126 U/L 76 - 52  AST 15 - 41 U/L 35 - 23  ALT 0 - 44 U/L 72(H) - 22    RADIOGRAPHIC STUDIES: CT BIOPSY  Result Date: 2021-11-15 CLINICAL DATA:  Normocytic anemia of unclear etiology EXAM: CT GUIDED DEEP ILIAC BONE ASPIRATION AND CORE BIOPSY TECHNIQUE: Patient was placed prone on the CT gantry and limited axial scans through the pelvis were obtained. Appropriate skin entry site was identified. Skin site was marked, prepped with chlorhexidine, draped in usual sterile fashion, and infiltrated locally with 1% lidocaine. Intravenous Fentanyl 176mg and Versed 470mwere administered as conscious sedation during continuous monitoring of the patient's level of consciousness and physiological / cardiorespiratory status by the radiology RN, with a total moderate sedation time of 10 minutes. Under CT fluoroscopic guidance an 11-gauge Cook trocar bone needle was advanced into the right iliac bone just lateral to the sacroiliac joint. Once needle tip position was confirmed, core and aspiration samples were obtained, submitted to pathology for approval. Post procedure scans show no hematoma or fracture. Patient tolerated procedure well. COMPLICATIONS: COMPLICATIONS none IMPRESSION: 1. Technically successful CT guided right iliac bone core and aspiration biopsy. Electronically Signed   By: D Lucrezia Europe.D.   On: 1112/23/20225:47   CT BONE MARROW BIOPSY & ASPIRATION  Result Date: 11December 23, 2022LINICAL DATA:  Normocytic anemia of unclear  etiology EXAM: CT GUIDED DEEP ILIAC BONE ASPIRATION AND CORE BIOPSY TECHNIQUE: Patient was placed prone on the CT gantry and limited axial scans through the pelvis were obtained. Appropriate skin entry site was identified. Skin site was marked, prepped with chlorhexidine, draped in usual sterile fashion, and infiltrated locally with 1% lidocaine. Intravenous Fentanyl 10038mand Versed 4mg75mre administered as conscious sedation during continuous monitoring of the patient's level of consciousness and physiological / cardiorespiratory status by the radiology RN, with a total moderate sedation time of 10 minutes. Under CT fluoroscopic guidance an 11-gauge Cook trocar bone needle was advanced into the right iliac bone just lateral to the sacroiliac joint. Once needle tip position was confirmed, core and aspiration samples were obtained, submitted to pathology for approval. Post procedure scans show no hematoma or fracture. Patient tolerated procedure well. COMPLICATIONS: COMPLICATIONS none IMPRESSION: 1. Technically successful CT guided right iliac bone core and aspiration biopsy. Electronically Signed   By: D  HLucrezia Europe.   On: 11/212/23/2247    ASSESSMENT & PLAN Kenaz Kerman Pfosty80. male with medical history significant for normocytic anemia of unclear etiology who presents for a follow up visit.  At this time the patient has persistent normocytic anemia despite adequate vitamin B12 repletion.  His hemoglobin is at 12.3 today, stable from prior.  His CBC is otherwise normal with a normal MCV platelet count and white blood cell count.    Previously I discussed with Mr. SettVealt his anemia has remained persistent without a clear etiology.  We have ordered an extensive set of lab work including nutritional studies, lysis labs, SPEP, reticulocyte panel, and peripheral blood film.  So far no clear etiology has been discerned.  The patient does have some concerning symptoms including feeling cold, numbness of the  hands and feet.  I noted with him that bone marrow biopsy may reveal etiology but would likely be low yield in this setting of a mild isolated normocytic anemia.  Patient noted he would like to pursue a bone marrow biopsy.  Bone marrow biopsy was performed  on 10/22/2021 and was found to be normal with no clear abnormalities.  #Normocytic anemia of Unclear Etiology --Recently diagnosed with B12 deficiency by PCP, started on B12 injections monthly --Hemoglobin today at 12.3 at last visit, stable from prior.  Vitamin B12 levels WNL. -- Prior labs showed no other concerning nutritional deficiencies or abnormalities causing his normocytic anemia. - bone marrow biopsy performed due to patient having symptoms he was concerned was related to his low blood counts.  No clear etiology was found for the patient's anemia on bone marrow biopsy. --RTC PRN. Continue to have routine f/u with PCP.   No orders of the defined types were placed in this encounter.  All questions were answered. The patient knows to call the clinic with any problems, questions or concerns.  A total of more than 25 minutes were spent on this encounter with face-to-face time and non-face-to-face time, including preparing to see the patient, ordering tests and/or medications, counseling the patient and coordination of care as outlined above.   Ledell Peoples, MD Department of Hematology/Oncology [place] [facility] Phone: [phone] Pager: [phone] Email: [email or web address]  10/28/2021 3:15 PM

## 2021-10-29 ENCOUNTER — Encounter (HOSPITAL_COMMUNITY): Payer: Self-pay | Admitting: Hematology and Oncology

## 2021-11-05 LAB — SURGICAL PATHOLOGY

## 2022-05-17 IMAGING — CT CT BIOPSY AND ASPIRATION BONE MARROW
1 of 2 series · 15 of 28 positions shown, 19 images · non-contrast
Comparison: none

CLINICAL DATA: Normocytic anemia of unclear etiology

EXAM:
CT GUIDED DEEP ILIAC BONE ASPIRATION AND CORE BIOPSY
TECHNIQUE: Patient was placed prone on the CT gantry and limited axial scans
through the pelvis were obtained. Appropriate skin entry site was
identified. Skin site was marked, prepped with chlorhexidine, draped
in usual sterile fashion, and infiltrated locally with 1% lidocaine.

[Series 2: i-spiral 5.0 br40 · axial · 0.94mm/px · z∈[+1020,+1097]mm · 15 of 25 slices shown, 19 images]
[im 2/25  mediastinal]
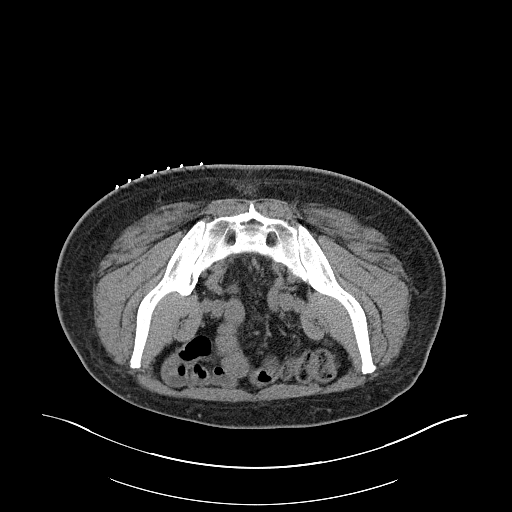
[im 2/25  lung]
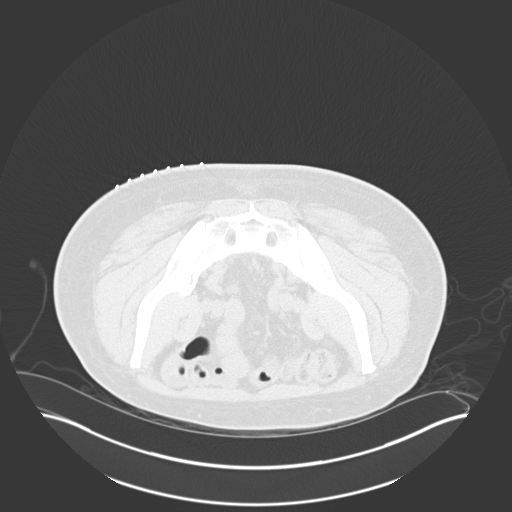
[im 4/25  lung]
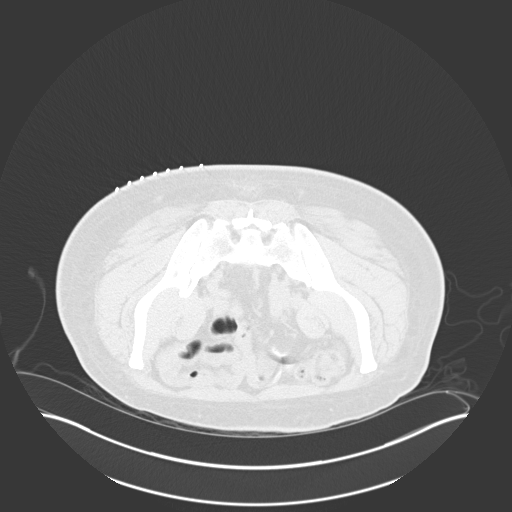
[im 5/25  lung]
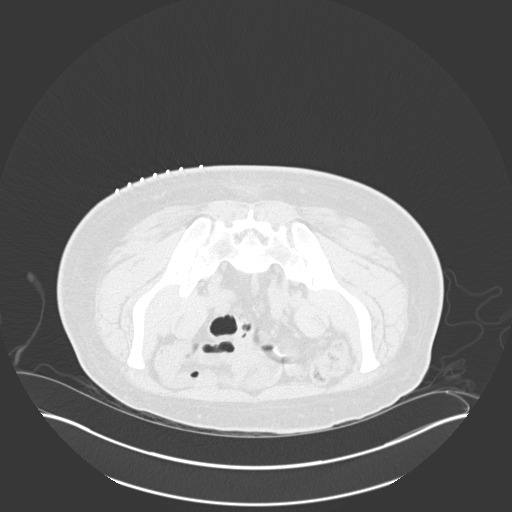
[im 7/25  lung]
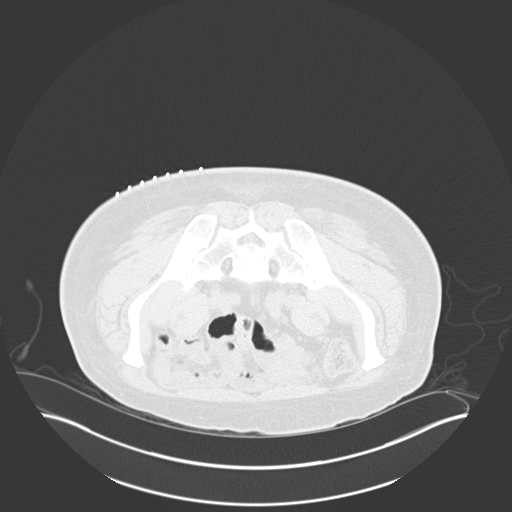
[im 8/25  mediastinal]
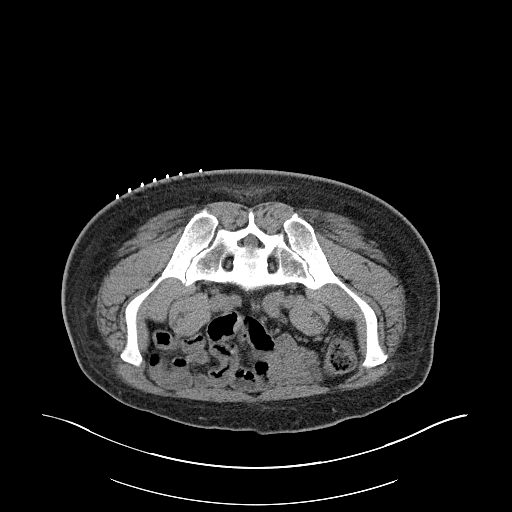
[im 8/25  lung]
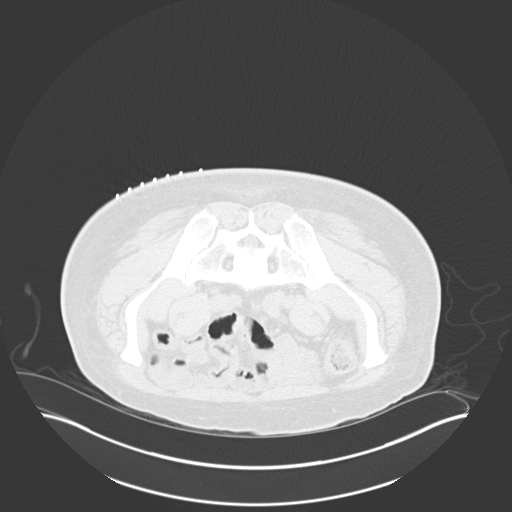
[im 10/25  lung]
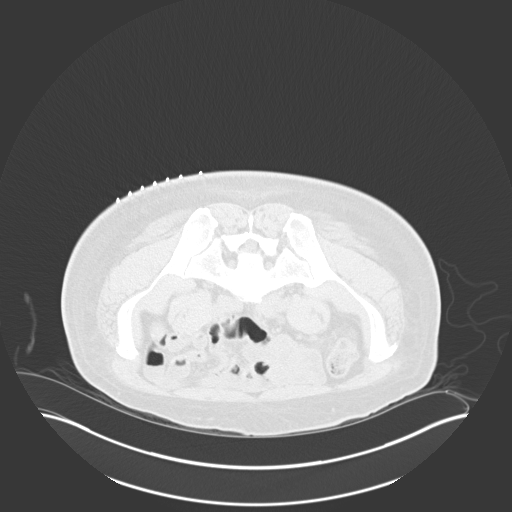
[im 11/25  lung]
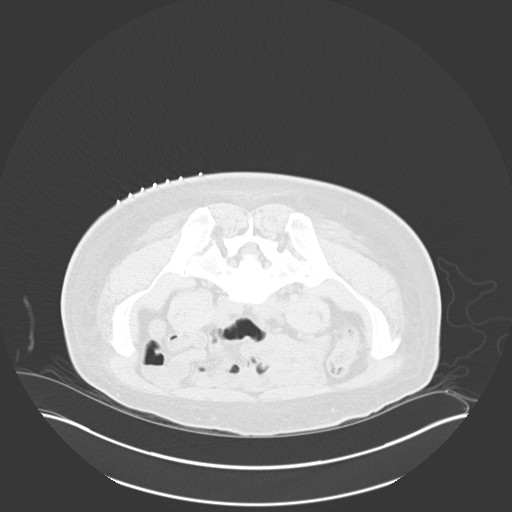
[im 13/25  lung]
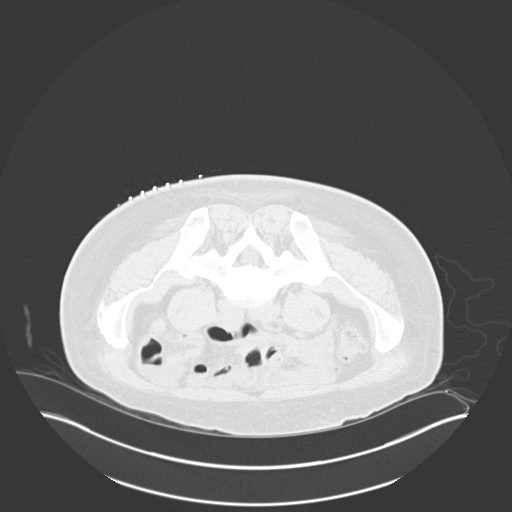
[im 15/25  mediastinal]
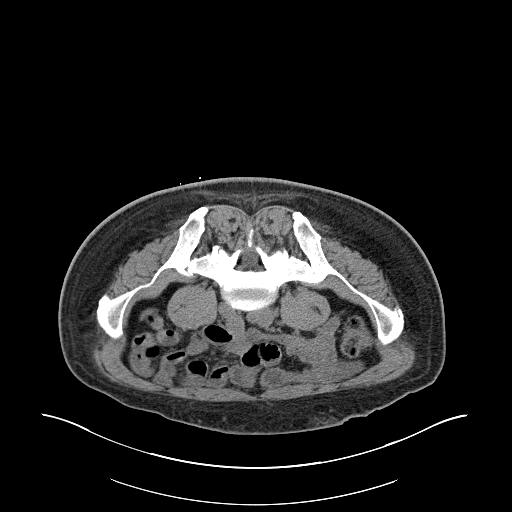
[im 15/25  lung]
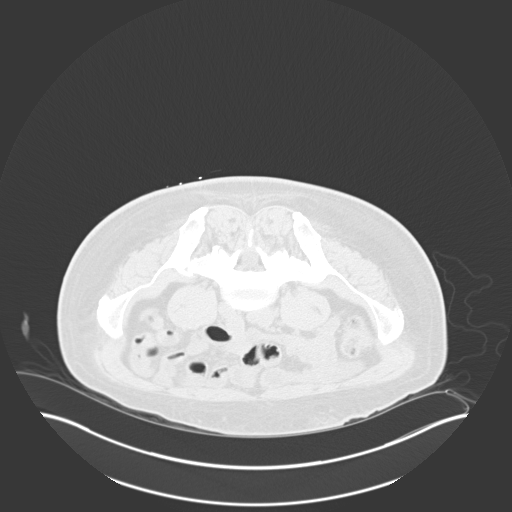
[im 16/25  lung]
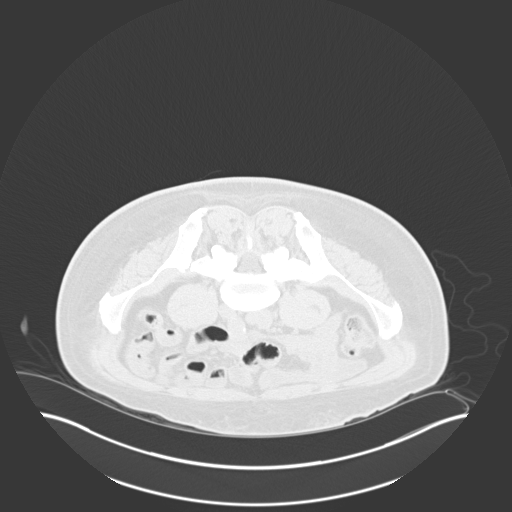
[im 18/25  lung]
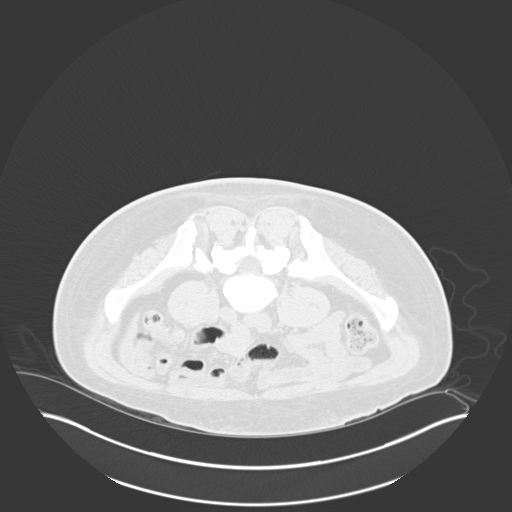
[im 19/25  lung]
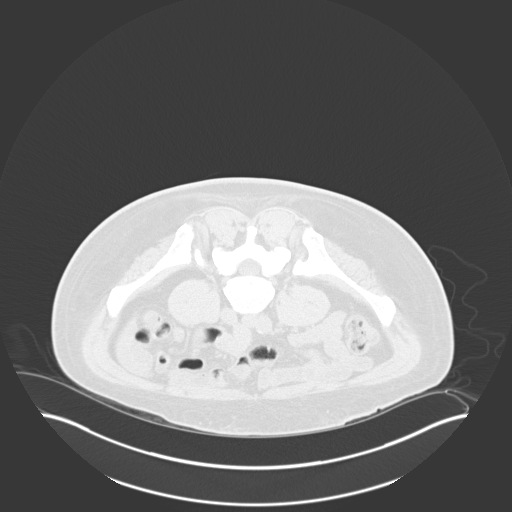
[im 21/25  mediastinal]
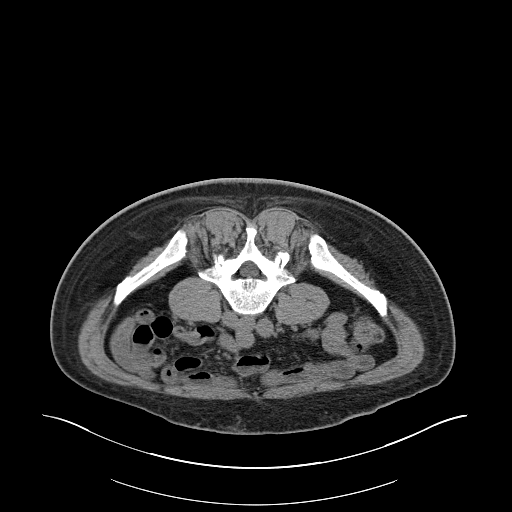
[im 21/25  lung]
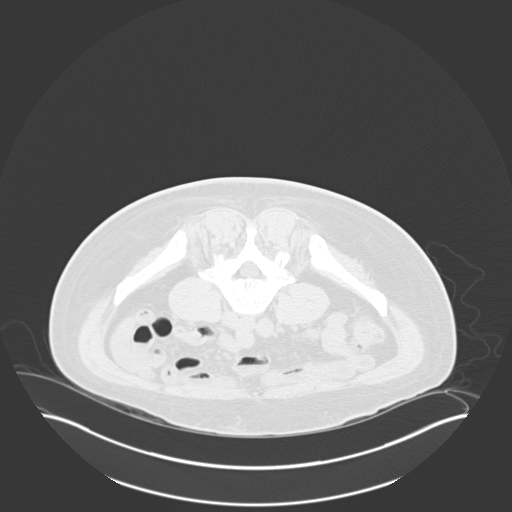
[im 22/25  lung]
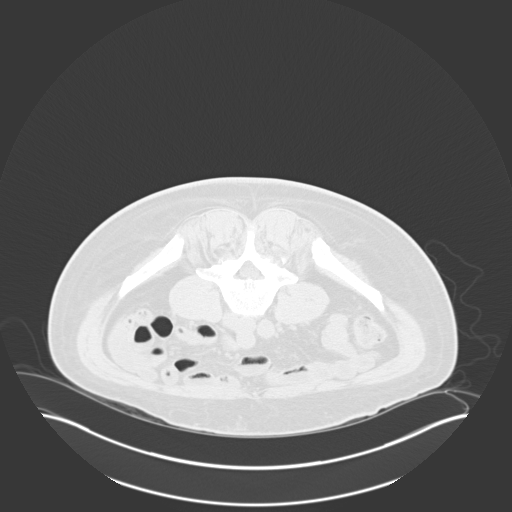
[im 24/25  lung]
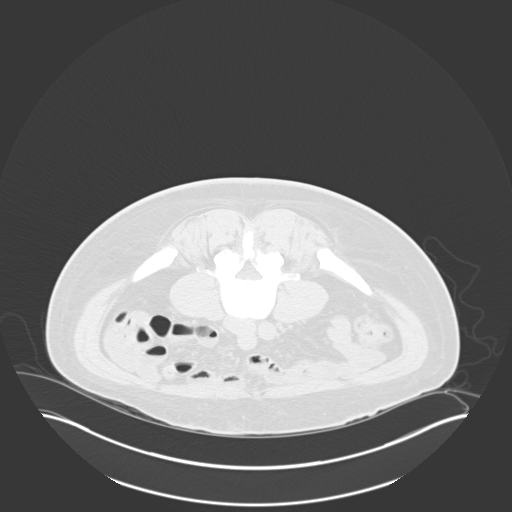

[15 of 28 positions shown; findings below may reference images not displayed]

Intravenous Fentanyl 300mcg and Versed 4mg were administered as
conscious sedation during continuous monitoring of the patient's
level of consciousness and physiological / cardiorespiratory status
by the radiology RN, with a total moderate sedation time of 10
minutes.

Under CT fluoroscopic guidance an 11-gauge Cook trocar bone needle
was advanced into the right iliac bone just lateral to the
sacroiliac joint. Once needle tip position was confirmed, core and
aspiration samples were obtained, submitted to pathology for
approval. Post procedure scans show no hematoma or fracture. Patient
tolerated procedure well.

COMPLICATIONS:
COMPLICATIONS
none
IMPRESSION: 1. Technically successful CT guided right iliac bone core and
aspiration biopsy.

## 2022-05-17 IMAGING — CT CT BIOPSY
1 of 2 series · 15 of 28 positions shown, 19 images · non-contrast
Comparison: none

CLINICAL DATA: Normocytic anemia of unclear etiology

EXAM:
CT GUIDED DEEP ILIAC BONE ASPIRATION AND CORE BIOPSY
TECHNIQUE: Patient was placed prone on the CT gantry and limited axial scans
through the pelvis were obtained. Appropriate skin entry site was
identified. Skin site was marked, prepped with chlorhexidine, draped
in usual sterile fashion, and infiltrated locally with 1% lidocaine.

[Series 2: i-spiral 5.0 br40 · axial · 0.94mm/px · z∈[+1020,+1097]mm · 15 of 25 slices shown, 19 images]
[im 2/25  mediastinal]
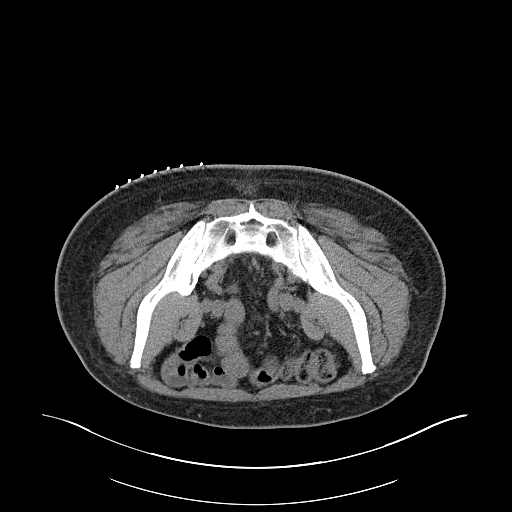
[im 2/25  lung]
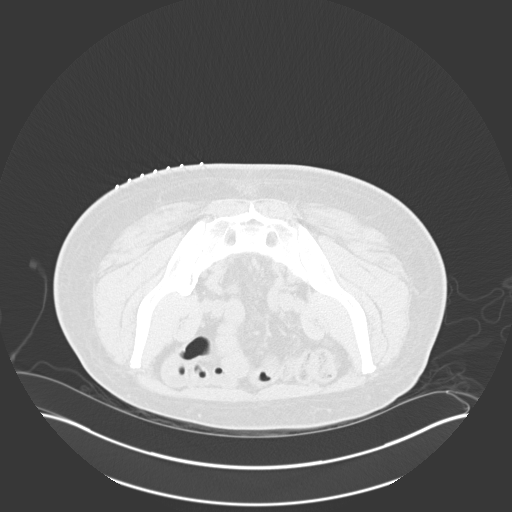
[im 4/25  lung]
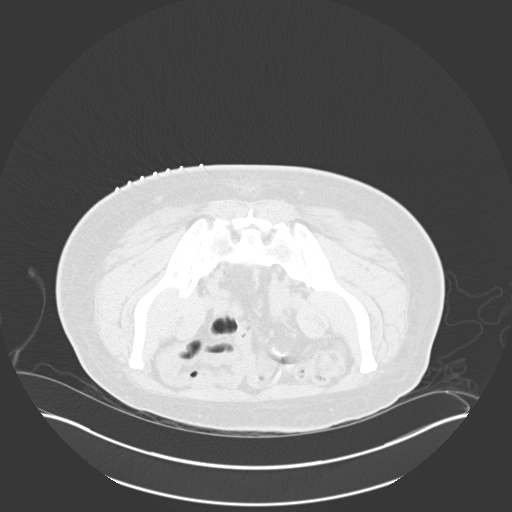
[im 5/25  lung]
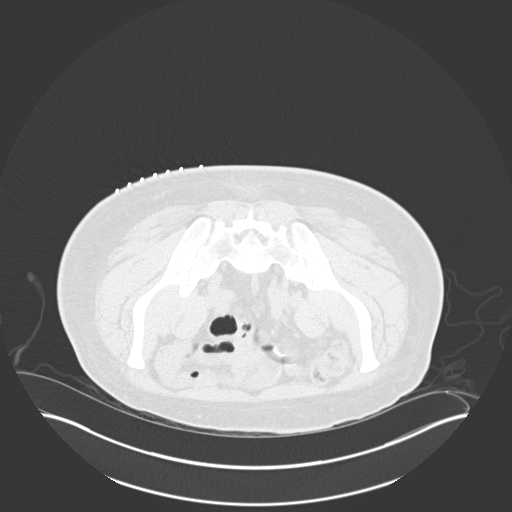
[im 7/25  lung]
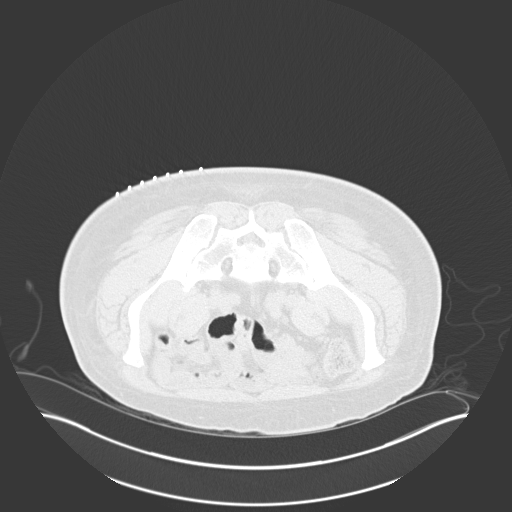
[im 8/25  mediastinal]
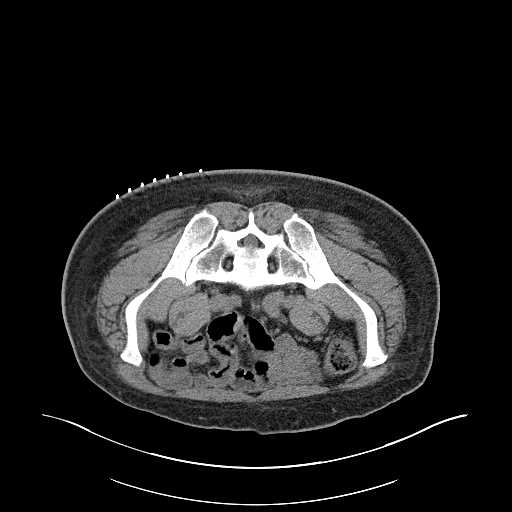
[im 8/25  lung]
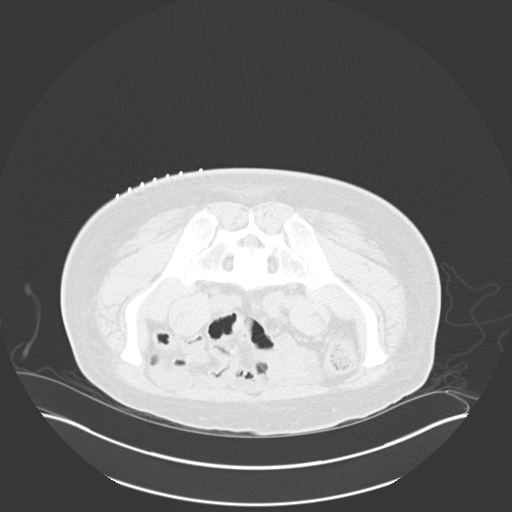
[im 10/25  lung]
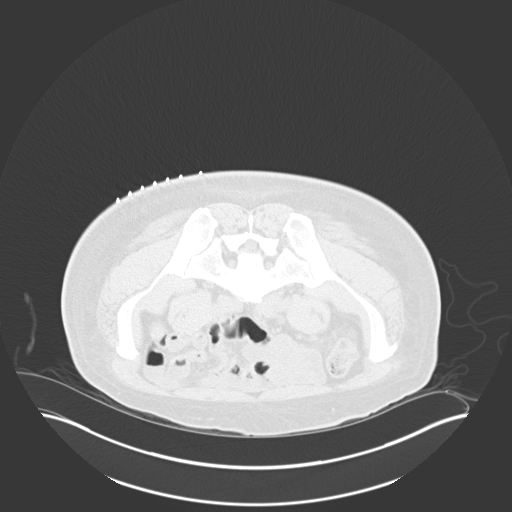
[im 11/25  lung]
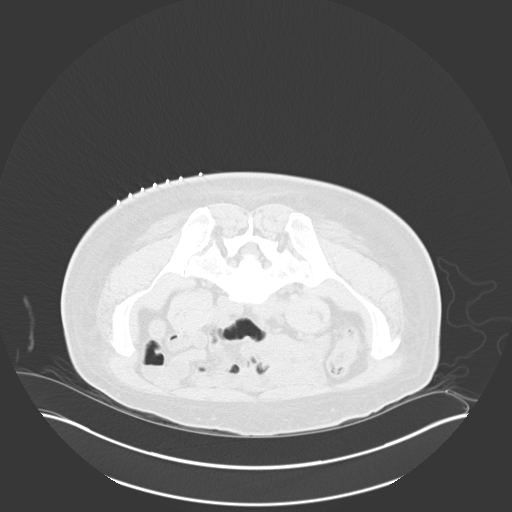
[im 13/25  lung]
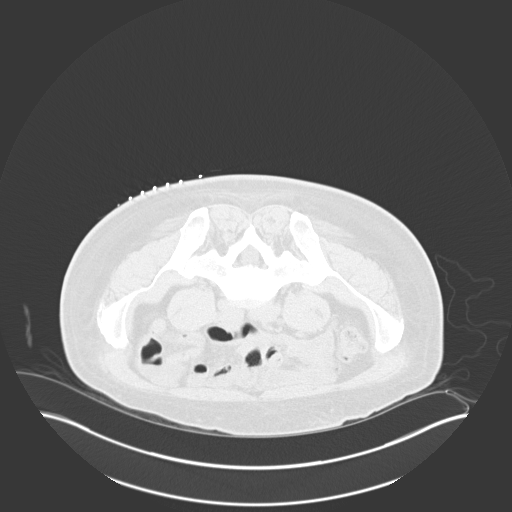
[im 15/25  mediastinal]
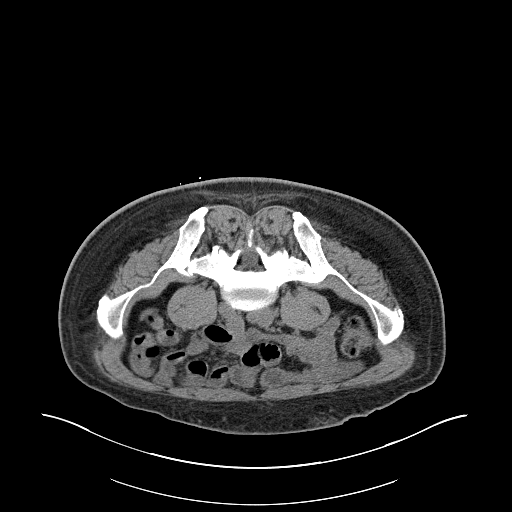
[im 15/25  lung]
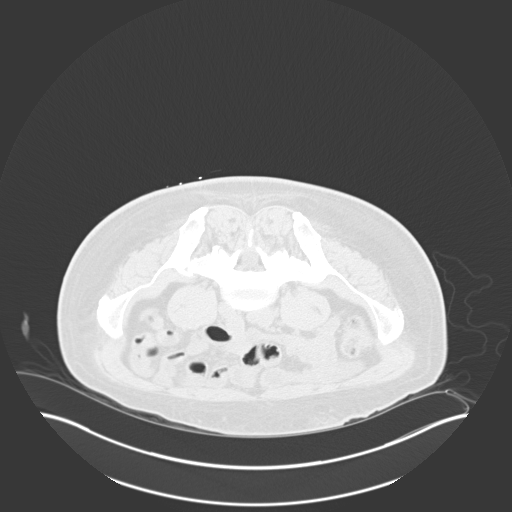
[im 16/25  lung]
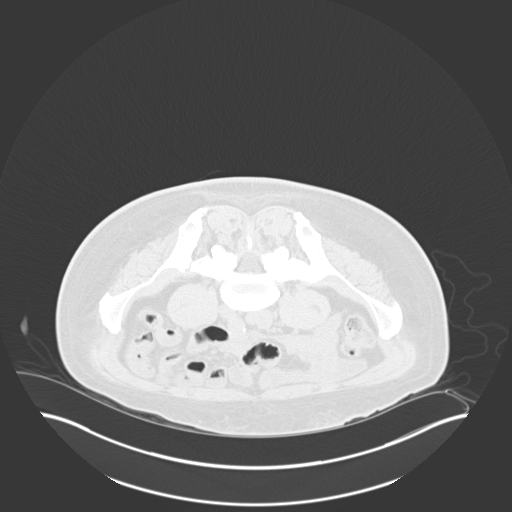
[im 18/25  lung]
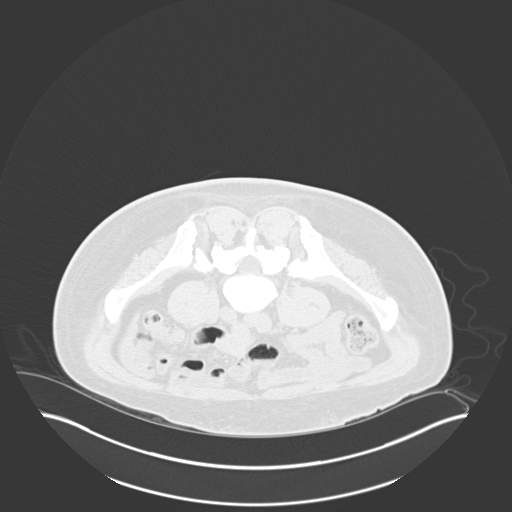
[im 19/25  lung]
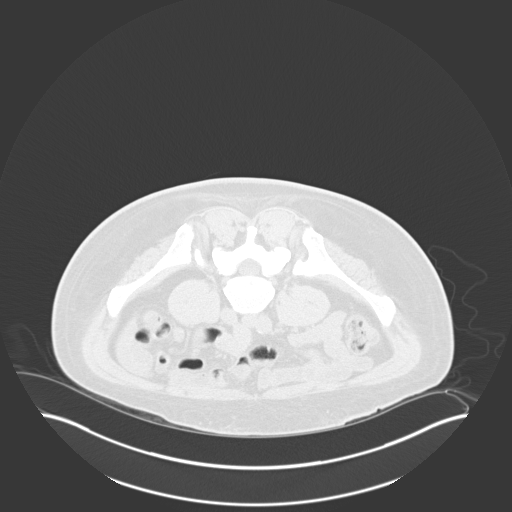
[im 21/25  mediastinal]
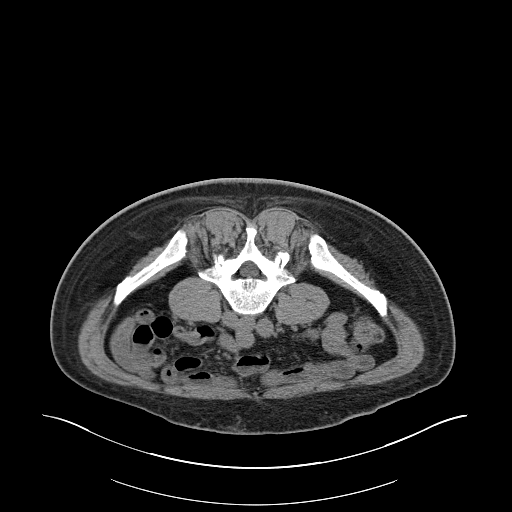
[im 21/25  lung]
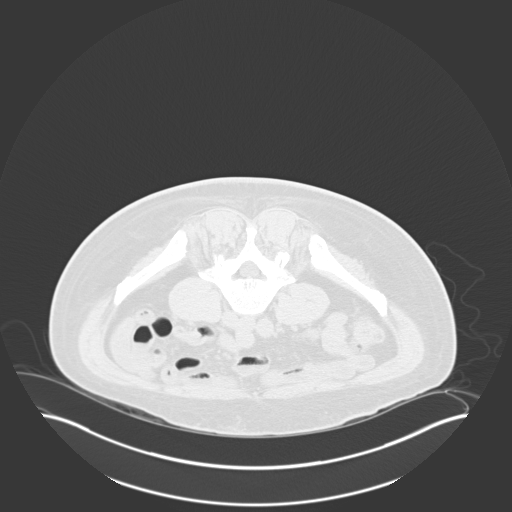
[im 22/25  lung]
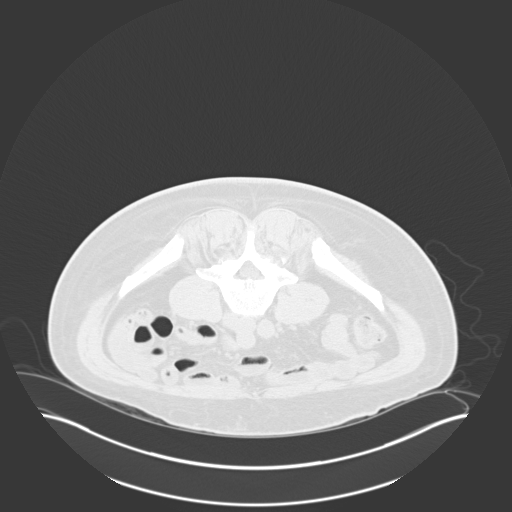
[im 24/25  lung]
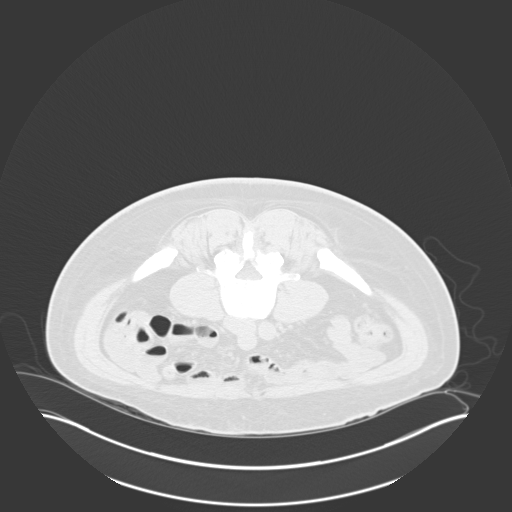

[15 of 28 positions shown; findings below may reference images not displayed]

Intravenous Fentanyl 300mcg and Versed 4mg were administered as
conscious sedation during continuous monitoring of the patient's
level of consciousness and physiological / cardiorespiratory status
by the radiology RN, with a total moderate sedation time of 10
minutes.

Under CT fluoroscopic guidance an 11-gauge Cook trocar bone needle
was advanced into the right iliac bone just lateral to the
sacroiliac joint. Once needle tip position was confirmed, core and
aspiration samples were obtained, submitted to pathology for
approval. Post procedure scans show no hematoma or fracture. Patient
tolerated procedure well.

COMPLICATIONS:
COMPLICATIONS
none
IMPRESSION: 1. Technically successful CT guided right iliac bone core and
aspiration biopsy.

## 2022-06-12 ENCOUNTER — Encounter: Payer: Self-pay | Admitting: Family Medicine

## 2022-06-18 ENCOUNTER — Ambulatory Visit: Payer: BLUE CROSS/BLUE SHIELD | Admitting: Family Medicine

## 2022-07-15 ENCOUNTER — Encounter: Payer: Self-pay | Admitting: Internal Medicine

## 2022-07-15 ENCOUNTER — Other Ambulatory Visit: Payer: Self-pay

## 2022-07-15 ENCOUNTER — Ambulatory Visit (INDEPENDENT_AMBULATORY_CARE_PROVIDER_SITE_OTHER): Payer: 59 | Admitting: Internal Medicine

## 2022-07-15 VITALS — BP 126/88 | HR 63 | Temp 98.2°F | Resp 18 | Ht 71.0 in | Wt 231.4 lb

## 2022-07-15 DIAGNOSIS — D510 Vitamin B12 deficiency anemia due to intrinsic factor deficiency: Secondary | ICD-10-CM

## 2022-07-15 DIAGNOSIS — I10 Essential (primary) hypertension: Secondary | ICD-10-CM | POA: Diagnosis not present

## 2022-07-15 DIAGNOSIS — E781 Pure hyperglyceridemia: Secondary | ICD-10-CM | POA: Diagnosis not present

## 2022-07-15 DIAGNOSIS — Z0001 Encounter for general adult medical examination with abnormal findings: Secondary | ICD-10-CM

## 2022-07-15 DIAGNOSIS — Z0000 Encounter for general adult medical examination without abnormal findings: Secondary | ICD-10-CM

## 2022-07-15 DIAGNOSIS — M1A09X0 Idiopathic chronic gout, multiple sites, without tophus (tophi): Secondary | ICD-10-CM | POA: Diagnosis not present

## 2022-07-15 DIAGNOSIS — E785 Hyperlipidemia, unspecified: Secondary | ICD-10-CM

## 2022-07-15 LAB — CBC WITH DIFFERENTIAL/PLATELET
Basophils Absolute: 0 10*3/uL (ref 0.0–0.1)
Basophils Relative: 0.9 % (ref 0.0–3.0)
Eosinophils Absolute: 0.1 10*3/uL (ref 0.0–0.7)
Eosinophils Relative: 2.1 % (ref 0.0–5.0)
HCT: 39.3 % (ref 39.0–52.0)
Hemoglobin: 13.8 g/dL (ref 13.0–17.0)
Lymphocytes Relative: 43.8 % (ref 12.0–46.0)
Lymphs Abs: 2.4 10*3/uL (ref 0.7–4.0)
MCHC: 35.1 g/dL (ref 30.0–36.0)
MCV: 89.5 fl (ref 78.0–100.0)
Monocytes Absolute: 0.4 10*3/uL (ref 0.1–1.0)
Monocytes Relative: 6.9 % (ref 3.0–12.0)
Neutro Abs: 2.5 10*3/uL (ref 1.4–7.7)
Neutrophils Relative %: 46.3 % (ref 43.0–77.0)
Platelets: 217 10*3/uL (ref 150.0–400.0)
RBC: 4.39 Mil/uL (ref 4.22–5.81)
RDW: 13.7 % (ref 11.5–15.5)
WBC: 5.4 10*3/uL (ref 4.0–10.5)

## 2022-07-15 LAB — PSA: PSA: 0.45 ng/mL (ref 0.10–4.00)

## 2022-07-15 LAB — TSH: TSH: 0.99 u[IU]/mL (ref 0.35–5.50)

## 2022-07-15 NOTE — Patient Instructions (Signed)
Health Maintenance, Male Adopting a healthy lifestyle and getting preventive care are important in promoting health and wellness. Ask your health care provider about: The right schedule for you to have regular tests and exams. Things you can do on your own to prevent diseases and keep yourself healthy. What should I know about diet, weight, and exercise? Eat a healthy diet  Eat a diet that includes plenty of vegetables, fruits, low-fat dairy products, and lean protein. Do not eat a lot of foods that are high in solid fats, added sugars, or sodium. Maintain a healthy weight Body mass index (BMI) is a measurement that can be used to identify possible weight problems. It estimates body fat based on height and weight. Your health care provider can help determine your BMI and help you achieve or maintain a healthy weight. Get regular exercise Get regular exercise. This is one of the most important things you can do for your health. Most adults should: Exercise for at least 150 minutes each week. The exercise should increase your heart rate and make you sweat (moderate-intensity exercise). Do strengthening exercises at least twice a week. This is in addition to the moderate-intensity exercise. Spend less time sitting. Even light physical activity can be beneficial. Watch cholesterol and blood lipids Have your blood tested for lipids and cholesterol at 55 years of age, then have this test every 5 years. You may need to have your cholesterol levels checked more often if: Your lipid or cholesterol levels are high. You are older than 55 years of age. You are at high risk for heart disease. What should I know about cancer screening? Many types of cancers can be detected early and may often be prevented. Depending on your health history and family history, you may need to have cancer screening at various ages. This may include screening for: Colorectal cancer. Prostate cancer. Skin cancer. Lung  cancer. What should I know about heart disease, diabetes, and high blood pressure? Blood pressure and heart disease High blood pressure causes heart disease and increases the risk of stroke. This is more likely to develop in people who have high blood pressure readings or are overweight. Talk with your health care provider about your target blood pressure readings. Have your blood pressure checked: Every 3-5 years if you are 18-39 years of age. Every year if you are 40 years old or older. If you are between the ages of 65 and 75 and are a current or former smoker, ask your health care provider if you should have a one-time screening for abdominal aortic aneurysm (AAA). Diabetes Have regular diabetes screenings. This checks your fasting blood sugar level. Have the screening done: Once every three years after age 45 if you are at a normal weight and have a low risk for diabetes. More often and at a younger age if you are overweight or have a high risk for diabetes. What should I know about preventing infection? Hepatitis B If you have a higher risk for hepatitis B, you should be screened for this virus. Talk with your health care provider to find out if you are at risk for hepatitis B infection. Hepatitis C Blood testing is recommended for: Everyone born from 1945 through 1965. Anyone with known risk factors for hepatitis C. Sexually transmitted infections (STIs) You should be screened each year for STIs, including gonorrhea and chlamydia, if: You are sexually active and are younger than 55 years of age. You are older than 55 years of age and your   health care provider tells you that you are at risk for this type of infection. Your sexual activity has changed since you were last screened, and you are at increased risk for chlamydia or gonorrhea. Ask your health care provider if you are at risk. Ask your health care provider about whether you are at high risk for HIV. Your health care provider  may recommend a prescription medicine to help prevent HIV infection. If you choose to take medicine to prevent HIV, you should first get tested for HIV. You should then be tested every 3 months for as long as you are taking the medicine. Follow these instructions at home: Alcohol use Do not drink alcohol if your health care provider tells you not to drink. If you drink alcohol: Limit how much you have to 0-2 drinks a day. Know how much alcohol is in your drink. In the [place] one drink equals one 12 oz bottle of beer (355 mL), one 5 oz glass of wine (148 mL), or one 1 oz glass of hard liquor (44 mL). Lifestyle Do not use any products that contain nicotine or tobacco. These products include cigarettes, chewing tobacco, and vaping devices, such as e-cigarettes. If you need help quitting, ask your health care provider. Do not use street drugs. Do not share needles. Ask your health care provider for help if you need support or information about quitting drugs. General instructions Schedule regular health, dental, and eye exams. Stay current with your vaccines. Tell your health care provider if: You often feel depressed. You have ever been abused or do not feel safe at home. Summary Adopting a healthy lifestyle and getting preventive care are important in promoting health and wellness. Follow your health care provider's instructions about healthy diet, exercising, and getting tested or screened for diseases. Follow your health care provider's instructions on monitoring your cholesterol and blood pressure. This information is not intended to replace advice given to you by your health care provider. Make sure you discuss any questions you have with your health care provider. Document Revised: 04/01/2021 Document Reviewed: 04/01/2021 Elsevier Patient Education  2023 [employer]  

## 2022-07-15 NOTE — Progress Notes (Unsigned)
 Subjective:  Patient ID: Rickey Hurley, male    DOB: 03/22/1966  Age: 56 y.o. MRN: [id number]  CC: Annual Exam (Patient states he is here for a CPE), Hypertension, and Hyperlipidemia   HPI Rickey Hurley presents for a CPX -  He is 2 years status post gastric sleeve.  He is no longer taking medications for gout, hypertension, or hyperlipidemia.  He is active and denies chest pain, shortness of breath, diaphoresis, edema, or fatigue.   Outpatient Medications Prior to Visit  Medication Sig Dispense Refill   allopurinol (ZYLOPRIM) 300 MG tablet TAKE ONE TABLET BY MOUTH DAILY 90 tablet 0   Multiple Vitamin (MULTIVITAMIN ADULT PO) Multivitamin     Omega-3 Fatty Acids (FISH OIL OMEGA-3 PO) 1,400 mg.     No facility-administered medications prior to visit.    ROS Review of Systems  Constitutional: Negative.  Negative for diaphoresis and fatigue.  HENT: Negative.    Eyes: Negative.   Respiratory:  Negative for cough, chest tightness, shortness of breath and wheezing.   Cardiovascular:  Negative for chest pain, palpitations and leg swelling.  Gastrointestinal:  Negative for abdominal pain, blood in stool, constipation and diarrhea.  Endocrine: Negative.   Genitourinary: Negative.  Negative for difficulty urinating.  Musculoskeletal: Negative.   Skin: Negative.   Neurological: Negative.  Negative for dizziness, weakness and light-headedness.  Hematological:  Negative for adenopathy. Does not bruise/bleed easily.  Psychiatric/Behavioral: Negative.      Objective:  BP 126/88   Pulse 63   Temp 98.2 F (36.8 C) (Oral)   Resp 18   Ht 5' 11" (1.803 m)   Wt 231 lb 6.4 oz (105 kg)   SpO2 97%   BMI 32.27 kg/m   BP Readings from Last 3 Encounters:  07/15/22 126/88  10/28/21 135/87  10/22/21 (!) 132/91    Wt Readings from Last 3 Encounters:  07/15/22 231 lb 6.4 oz (105 kg)  10/28/21 220 lb 12.8 oz (100.2 kg)  10/22/21 210 lb (95.3 kg)    Physical Exam Vitals reviewed.  HENT:      Mouth/Throat:     Mouth: Mucous membranes are moist.  Eyes:     General: No scleral icterus.    Conjunctiva/sclera: Conjunctivae normal.  Cardiovascular:     Rate and Rhythm: Normal rate and regular rhythm.     Heart sounds: No murmur heard. Pulmonary:     Effort: Pulmonary effort is normal.     Breath sounds: No stridor. No wheezing, rhonchi or rales.  Abdominal:     General: Abdomen is flat.     Palpations: There is no mass.     Tenderness: There is no abdominal tenderness. There is no guarding.     Hernia: No hernia is present. There is no hernia in the left inguinal area or right inguinal area.  Genitourinary:    Pubic Area: No rash.      Penis: Normal and circumcised.      Testes: Normal.     Epididymis:     Right: Normal.     Left: Normal.     Prostate: Not enlarged, not tender and no nodules present.     Rectum: Normal. Guaiac result negative. No mass, tenderness, anal fissure, external hemorrhoid or internal hemorrhoid. Normal anal tone.  Musculoskeletal:        General: Normal range of motion.     Cervical back: Neck supple.  Lymphadenopathy:     Cervical: No cervical adenopathy.     Lower Body:   No right inguinal adenopathy. No left inguinal adenopathy.  Skin:    General: Skin is warm and dry.  Neurological:     General: No focal deficit present.     Mental Status: He is alert. Mental status is at baseline.  Psychiatric:        Mood and Affect: Mood normal.        Behavior: Behavior normal.     Lab Results  Component Value Date   WBC 5.4 07/15/2022   HGB 13.8 07/15/2022   HCT 39.3 07/15/2022   PLT 217.0 07/15/2022   GLUCOSE 87 07/15/2022   CHOL 170 07/15/2022   TRIG 206.0 (H) 07/15/2022   HDL 41.10 07/15/2022   LDLDIRECT 102.0 07/15/2022   LDLCALC 52 07/10/2021   ALT 42 07/15/2022   AST 29 07/15/2022   NA 141 07/15/2022   K 4.0 07/15/2022   CL 104 07/15/2022   CREATININE 0.87 07/15/2022   BUN 14 07/15/2022   CO2 29 07/15/2022   TSH 0.99  07/15/2022   PSA 0.45 07/15/2022   HGBA1C 4.5 (L) 07/10/2021    CT BIOPSY  Result Date: 10/22/2021 CLINICAL DATA:  Normocytic anemia of unclear etiology EXAM: CT GUIDED DEEP ILIAC BONE ASPIRATION AND CORE BIOPSY TECHNIQUE: Patient was placed prone on the CT gantry and limited axial scans through the pelvis were obtained. Appropriate skin entry site was identified. Skin site was marked, prepped with chlorhexidine, draped in usual sterile fashion, and infiltrated locally with 1% lidocaine. Intravenous Fentanyl 100mcg and Versed 4mg were administered as conscious sedation during continuous monitoring of the patient's level of consciousness and physiological / cardiorespiratory status by the radiology RN, with a total moderate sedation time of 10 minutes. Under CT fluoroscopic guidance an 11-gauge Cook trocar bone needle was advanced into the right iliac bone just lateral to the sacroiliac joint. Once needle tip position was confirmed, core and aspiration samples were obtained, submitted to pathology for approval. Post procedure scans show no hematoma or fracture. Patient tolerated procedure well. COMPLICATIONS: COMPLICATIONS none IMPRESSION: 1. Technically successful CT guided right iliac bone core and aspiration biopsy. Electronically Signed   By: D  Hassell M.D.   On: 10/22/2021 15:47   CT BONE MARROW BIOPSY & ASPIRATION  Result Date: 10/22/2021 CLINICAL DATA:  Normocytic anemia of unclear etiology EXAM: CT GUIDED DEEP ILIAC BONE ASPIRATION AND CORE BIOPSY TECHNIQUE: Patient was placed prone on the CT gantry and limited axial scans through the pelvis were obtained. Appropriate skin entry site was identified. Skin site was marked, prepped with chlorhexidine, draped in usual sterile fashion, and infiltrated locally with 1% lidocaine. Intravenous Fentanyl 100mcg and Versed 4mg were administered as conscious sedation during continuous monitoring of the patient's level of consciousness and physiological /  cardiorespiratory status by the radiology RN, with a total moderate sedation time of 10 minutes. Under CT fluoroscopic guidance an 11-gauge Cook trocar bone needle was advanced into the right iliac bone just lateral to the sacroiliac joint. Once needle tip position was confirmed, core and aspiration samples were obtained, submitted to pathology for approval. Post procedure scans show no hematoma or fracture. Patient tolerated procedure well. COMPLICATIONS: COMPLICATIONS none IMPRESSION: 1. Technically successful CT guided right iliac bone core and aspiration biopsy. Electronically Signed   By: D  Hassell M.D.   On: 10/22/2021 15:47    Assessment & Plan:   Clemmie was seen today for annual exam, hypertension and hyperlipidemia.  Diagnoses and all orders for this visit:  Essential hypertension-good morning how are   you  His blood pressure is adequately well controlled. -     Basic metabolic panel; Future -     TSH; Future -     Urinalysis, Routine w reflex microscopic; Future -     Hepatic function panel; Future -     CBC with Differential/Platelet; Future -     CBC with Differential/Platelet -     Hepatic function panel -     Urinalysis, Routine w reflex microscopic -     TSH -     Basic metabolic panel  Idiopathic chronic gout of multiple sites without tophus- He has achieved his uric acid goal and has had no recent episodes of gouty attacks. -     Uric acid; Future -     Uric acid  Encounter for general adult medical examination with abnormal findings- Exam completed, labs reviewed-statin therapy is not indicated, vaccines are up-to-date, cancer screenings are up-to-date, patient education was given. -     Lipid panel; Future -     PSA; Future -     PSA -     Lipid panel  Vitamin B12 deficiency anemia due to intrinsic factor deficiency  Pure hyperglyceridemia- His trigs are mildly elevated but not high enough to be treated.  Other orders -     LDL cholesterol, direct   I am having  Adelfo Friend maintain his allopurinol, Omega-3 Fatty Acids (FISH OIL OMEGA-3 PO), and Multiple Vitamin (MULTIVITAMIN ADULT PO).  No orders of the defined types were placed in this encounter.    Follow-up: Return in about 6 months (around 01/15/2023).  Thomas Jones, MD 

## 2022-07-16 LAB — URINALYSIS, ROUTINE W REFLEX MICROSCOPIC
Bilirubin Urine: NEGATIVE
Hgb urine dipstick: NEGATIVE
Ketones, ur: NEGATIVE
Leukocytes,Ua: NEGATIVE
Nitrite: NEGATIVE
RBC / HPF: NONE SEEN (ref 0–?)
Specific Gravity, Urine: 1.02 (ref 1.000–1.030)
Total Protein, Urine: NEGATIVE
Urine Glucose: NEGATIVE
Urobilinogen, UA: 1 (ref 0.0–1.0)
pH: 7 (ref 5.0–8.0)

## 2022-07-16 LAB — HEPATIC FUNCTION PANEL
ALT: 42 U/L (ref 0–53)
AST: 29 U/L (ref 0–37)
Albumin: 4.4 g/dL (ref 3.5–5.2)
Alkaline Phosphatase: 54 U/L (ref 39–117)
Bilirubin, Direct: 0.1 mg/dL (ref 0.0–0.3)
Total Bilirubin: 0.5 mg/dL (ref 0.2–1.2)
Total Protein: 6.6 g/dL (ref 6.0–8.3)

## 2022-07-16 LAB — BASIC METABOLIC PANEL
BUN: 14 mg/dL (ref 6–23)
CO2: 29 mEq/L (ref 19–32)
Calcium: 9.3 mg/dL (ref 8.4–10.5)
Chloride: 104 mEq/L (ref 96–112)
Creatinine, Ser: 0.87 mg/dL (ref 0.40–1.50)
GFR: 97.18 mL/min (ref >60.00)
Glucose, Bld: 87 mg/dL (ref 70–99)
Potassium: 4 mEq/L (ref 3.5–5.1)
Sodium: 141 mEq/L (ref 135–145)

## 2022-07-16 LAB — LIPID PANEL
Cholesterol: 170 mg/dL (ref 0–200)
HDL: 41.1 mg/dL (ref >39.00)
NonHDL: 128.52
Total CHOL/HDL Ratio: 4
Triglycerides: 206 mg/dL — ABNORMAL HIGH (ref 0.0–149.0)
VLDL: 41.2 mg/dL — ABNORMAL HIGH (ref 0.0–40.0)

## 2022-07-16 LAB — URIC ACID: Uric Acid, Serum: 5.5 mg/dL (ref 4.0–7.8)

## 2022-07-16 LAB — LDL CHOLESTEROL, DIRECT: Direct LDL: 102 mg/dL

## 2022-07-23 ENCOUNTER — Encounter: Payer: Self-pay | Admitting: Internal Medicine

## 2022-07-23 DIAGNOSIS — M1A09X0 Idiopathic chronic gout, multiple sites, without tophus (tophi): Secondary | ICD-10-CM

## 2022-07-23 MED ORDER — ALLOPURINOL 300 MG PO TABS: 300.0000 mg | ORAL_TABLET | Freq: Every day | ORAL | 1 refills | Status: DC

## 2023-01-03 ENCOUNTER — Other Ambulatory Visit: Payer: Self-pay | Admitting: Internal Medicine

## 2023-01-03 DIAGNOSIS — M1A09X0 Idiopathic chronic gout, multiple sites, without tophus (tophi): Secondary | ICD-10-CM

## 2023-05-09 ENCOUNTER — Encounter: Payer: Self-pay | Admitting: Internal Medicine

## 2023-05-20 ENCOUNTER — Other Ambulatory Visit: Payer: Self-pay | Admitting: Internal Medicine

## 2023-05-20 DIAGNOSIS — M1A09X0 Idiopathic chronic gout, multiple sites, without tophus (tophi): Secondary | ICD-10-CM

## 2023-06-04 ENCOUNTER — Other Ambulatory Visit: Payer: Self-pay | Admitting: Internal Medicine

## 2023-06-04 DIAGNOSIS — M1A09X0 Idiopathic chronic gout, multiple sites, without tophus (tophi): Secondary | ICD-10-CM

## 2023-08-11 ENCOUNTER — Other Ambulatory Visit: Payer: Self-pay | Admitting: Internal Medicine

## 2023-08-11 DIAGNOSIS — M1A09X0 Idiopathic chronic gout, multiple sites, without tophus (tophi): Secondary | ICD-10-CM

## 2023-09-24 ENCOUNTER — Ambulatory Visit (INDEPENDENT_AMBULATORY_CARE_PROVIDER_SITE_OTHER): Payer: 59 | Admitting: Internal Medicine

## 2023-09-24 ENCOUNTER — Other Ambulatory Visit: Payer: Self-pay | Admitting: Internal Medicine

## 2023-09-24 ENCOUNTER — Encounter: Payer: Self-pay | Admitting: Internal Medicine

## 2023-09-24 VITALS — BP 148/94 | HR 56 | Temp 98.3°F | Resp 16 | Ht 71.0 in | Wt 235.7 lb

## 2023-09-24 DIAGNOSIS — M1A09X0 Idiopathic chronic gout, multiple sites, without tophus (tophi): Secondary | ICD-10-CM

## 2023-09-24 DIAGNOSIS — E559 Vitamin D deficiency, unspecified: Secondary | ICD-10-CM

## 2023-09-24 DIAGNOSIS — Z0000 Encounter for general adult medical examination without abnormal findings: Secondary | ICD-10-CM | POA: Diagnosis not present

## 2023-09-24 DIAGNOSIS — M5442 Lumbago with sciatica, left side: Secondary | ICD-10-CM

## 2023-09-24 DIAGNOSIS — Z0001 Encounter for general adult medical examination with abnormal findings: Secondary | ICD-10-CM

## 2023-09-24 DIAGNOSIS — G8929 Other chronic pain: Secondary | ICD-10-CM

## 2023-09-24 DIAGNOSIS — I10 Essential (primary) hypertension: Secondary | ICD-10-CM | POA: Diagnosis not present

## 2023-09-24 DIAGNOSIS — G4733 Obstructive sleep apnea (adult) (pediatric): Secondary | ICD-10-CM

## 2023-09-24 DIAGNOSIS — K635 Polyp of colon: Secondary | ICD-10-CM

## 2023-09-24 DIAGNOSIS — I119 Hypertensive heart disease without heart failure: Secondary | ICD-10-CM

## 2023-09-24 DIAGNOSIS — E781 Pure hyperglyceridemia: Secondary | ICD-10-CM

## 2023-09-24 DIAGNOSIS — Z903 Acquired absence of stomach [part of]: Secondary | ICD-10-CM

## 2023-09-24 LAB — URINALYSIS, ROUTINE W REFLEX MICROSCOPIC
Bilirubin Urine: NEGATIVE
Hgb urine dipstick: NEGATIVE
Ketones, ur: NEGATIVE
Leukocytes,Ua: NEGATIVE
Nitrite: NEGATIVE
RBC / HPF: NONE SEEN (ref 0–?)
Specific Gravity, Urine: 1.015 (ref 1.000–1.030)
Total Protein, Urine: NEGATIVE
Urine Glucose: NEGATIVE
Urobilinogen, UA: 1 (ref 0.0–1.0)
pH: 7 (ref 5.0–8.0)

## 2023-09-24 LAB — LIPID PANEL
Cholesterol: 195 mg/dL (ref 0–200)
HDL: 48.7 mg/dL (ref >39.00)
LDL Cholesterol: 116 mg/dL — ABNORMAL HIGH (ref 0–99)
NonHDL: 146.47
Total CHOL/HDL Ratio: 4
Triglycerides: 154 mg/dL — ABNORMAL HIGH (ref 0.0–149.0)
VLDL: 30.8 mg/dL (ref 0.0–40.0)

## 2023-09-24 LAB — CBC WITH DIFFERENTIAL/PLATELET
Basophils Absolute: 0 10*3/uL (ref 0.0–0.1)
Basophils Relative: 0.9 % (ref 0.0–3.0)
Eosinophils Absolute: 0.1 10*3/uL (ref 0.0–0.7)
Eosinophils Relative: 2.1 % (ref 0.0–5.0)
HCT: 42 % (ref 39.0–52.0)
Hemoglobin: 14.1 g/dL (ref 13.0–17.0)
Lymphocytes Relative: 41.1 % (ref 12.0–46.0)
Lymphs Abs: 2.1 10*3/uL (ref 0.7–4.0)
MCHC: 33.5 g/dL (ref 30.0–36.0)
MCV: 91.8 fL (ref 78.0–100.0)
Monocytes Absolute: 0.5 10*3/uL (ref 0.1–1.0)
Monocytes Relative: 8.9 % (ref 3.0–12.0)
Neutro Abs: 2.4 10*3/uL (ref 1.4–7.7)
Neutrophils Relative %: 47 % (ref 43.0–77.0)
Platelets: 250 10*3/uL (ref 150.0–400.0)
RBC: 4.58 Mil/uL (ref 4.22–5.81)
RDW: 13.8 % (ref 11.5–15.5)
WBC: 5.2 10*3/uL (ref 4.0–10.5)

## 2023-09-24 LAB — URIC ACID: Uric Acid, Serum: 4.3 mg/dL (ref 4.0–7.8)

## 2023-09-24 LAB — BASIC METABOLIC PANEL
BUN: 14 mg/dL (ref 6–23)
CO2: 28 meq/L (ref 19–32)
Calcium: 9.6 mg/dL (ref 8.4–10.5)
Chloride: 103 meq/L (ref 96–112)
Creatinine, Ser: 0.71 mg/dL (ref 0.40–1.50)
GFR: 102.47 mL/min (ref >60.00)
Glucose, Bld: 88 mg/dL (ref 70–99)
Potassium: 4 meq/L (ref 3.5–5.1)
Sodium: 141 meq/L (ref 135–145)

## 2023-09-24 LAB — TSH: TSH: 1.84 u[IU]/mL (ref 0.35–5.50)

## 2023-09-24 LAB — HEPATIC FUNCTION PANEL
ALT: 46 U/L (ref 0–53)
AST: 30 U/L (ref 0–37)
Albumin: 4.4 g/dL (ref 3.5–5.2)
Alkaline Phosphatase: 65 U/L (ref 39–117)
Bilirubin, Direct: 0.1 mg/dL (ref 0.0–0.3)
Total Bilirubin: 0.7 mg/dL (ref 0.2–1.2)
Total Protein: 7 g/dL (ref 6.0–8.3)

## 2023-09-24 LAB — PSA: PSA: 0.6 ng/mL (ref 0.10–4.00)

## 2023-09-24 LAB — VITAMIN D 25 HYDROXY (VIT D DEFICIENCY, FRACTURES): VITD: 43.13 ng/mL (ref 30.00–100.00)

## 2023-09-24 MED ORDER — ALLOPURINOL 300 MG PO TABS: 300.0000 mg | ORAL_TABLET | Freq: Every day | ORAL | 1 refills | Status: DC

## 2023-09-24 MED ORDER — TIZANIDINE HCL 4 MG PO CAPS: 4.0000 mg | ORAL_CAPSULE | Freq: Three times a day (TID) | ORAL | 0 refills | Status: DC | PRN

## 2023-09-24 MED ORDER — OLMESARTAN MEDOXOMIL 20 MG PO TABS: 20.0000 mg | ORAL_TABLET | Freq: Every day | ORAL | 1 refills | Status: DC

## 2023-09-24 NOTE — Progress Notes (Signed)
Subjective:  Patient ID: Rickey Hurley, male    DOB: 01-25-1966  Age: 57 y.o. MRN: [id number]  CC: Annual Exam (Annual Exam), Hypertension, and Hyperlipidemia   HPI Kiva Lueken presents for a CPX and f/up ----  Discussed the use of AI scribe software for clinical note transcription with the patient, who gave verbal consent to proceed.  History of Present Illness   The patient reports an overall improvement in health and physical activity. He has been actively engaged in farm work, including walking, cutting trees, and mowing, without experiencing any chest pain, shortness of breath, dizziness, or lightheadedness.   The patient is compliant with his vitamin supplements and uses a CPAP machine nightly for sleep apnea. He has noticed a significant difference in his sleep quality when the machine is not used. The patient has not had any recent gout attacks since his ankle surgery.  He has a history of high blood pressure but is not currently on any medication for it.   The patient's heart rate is typically below 60 when at rest but increases with physical activity. He has not experienced any symptoms of weakness, dizziness, or lightheadedness associated with a low heart rate.  The patient has not reported any issues with urination or any blood in his stool. He has not experienced any pain or swelling in his ankles post-surgery.       Outpatient Medications Prior to Visit  Medication Sig Dispense Refill   Multiple Vitamin (MULTIVITAMIN ADULT PO) Multivitamin     Omega-3 Fatty Acids (FISH OIL OMEGA-3 PO) 1,400 mg.     allopurinol (ZYLOPRIM) 300 MG tablet TAKE 1 TABLET DAILY 90 tablet 0   tiZANidine (ZANAFLEX) 4 MG capsule Take 4 mg by mouth 1 day or 1 dose.     No facility-administered medications prior to visit.    ROS Review of Systems  Constitutional:  Positive for unexpected weight change (wt gain). Negative for chills, diaphoresis and fatigue.  HENT: Negative.    Eyes: Negative.    Respiratory:  Positive for apnea. Negative for cough, choking, shortness of breath and wheezing.   Cardiovascular:  Negative for chest pain, palpitations and leg swelling.  Gastrointestinal:  Negative for abdominal pain, blood in stool, constipation, diarrhea, nausea and vomiting.  Endocrine: Negative.   Genitourinary:  Negative for difficulty urinating and dysuria.  Musculoskeletal: Negative.  Negative for arthralgias, back pain, myalgias and neck pain.  Skin: Negative.   Allergic/Immunologic: Negative.   Neurological: Negative.  Negative for dizziness and light-headedness.  Hematological:  Negative for adenopathy. Does not bruise/bleed easily.  Psychiatric/Behavioral: Negative.      Objective:  BP (!) 148/94 Comment: BP (R) 148/94 (L) 144/92  Pulse (!) 56   Temp 98.3 F (36.8 C) (Oral)   Resp 16   Ht 5\' 11"  (1.803 m)   Wt 235 lb 11.2 oz (106.9 kg)   SpO2 98%   BMI 32.87 kg/m   BP Readings from Last 3 Encounters:  09/24/23 (!) 148/94  07/15/22 126/88  10/28/21 135/87    Wt Readings from Last 3 Encounters:  09/24/23 235 lb 11.2 oz (106.9 kg)  07/15/22 231 lb 6.4 oz (105 kg)  10/28/21 220 lb 12.8 oz (100.2 kg)    Physical Exam Vitals reviewed.  Constitutional:      Appearance: Normal appearance.  HENT:     Mouth/Throat:     Mouth: Mucous membranes are moist.  Eyes:     General: No scleral icterus.    Conjunctiva/sclera: Conjunctivae  normal.  Cardiovascular:     Rate and Rhythm: Bradycardia present.     Pulses: Normal pulses.     Heart sounds: No murmur heard.    No friction rub. No gallop.     Comments: EKG- SB, 55 bpm ++LVH No Q waves or ST/T wave changes Pulmonary:     Effort: Pulmonary effort is normal.     Breath sounds: No stridor. No wheezing, rhonchi or rales.  Abdominal:     General: Abdomen is flat.     Palpations: There is no mass.     Tenderness: There is no abdominal tenderness. There is no guarding or rebound.     Hernia: No hernia is  present. There is no hernia in the left inguinal area or right inguinal area.  Genitourinary:    Pubic Area: No rash.      Penis: Normal and circumcised.      Testes: Normal.     Epididymis:     Right: Normal.     Left: Normal.     Prostate: Normal. Not enlarged, not tender and no nodules present.     Rectum: Normal. Guaiac result negative. No mass, tenderness, anal fissure, external hemorrhoid or internal hemorrhoid. Normal anal tone.  Musculoskeletal:     Cervical back: Neck supple.     Right lower leg: No edema.     Left lower leg: No edema.  Lymphadenopathy:     Cervical: No cervical adenopathy.     Lower Body: No right inguinal adenopathy. No left inguinal adenopathy.  Skin:    General: Skin is warm and dry.  Neurological:     General: No focal deficit present.     Mental Status: He is alert. Mental status is at baseline.  Psychiatric:        Mood and Affect: Mood normal.        Behavior: Behavior normal.     Lab Results  Component Value Date   WBC 5.2 09/24/2023   HGB 14.1 09/24/2023   HCT 42.0 09/24/2023   PLT 250.0 09/24/2023   GLUCOSE 88 09/24/2023   CHOL 195 09/24/2023   TRIG 154.0 (H) 09/24/2023   HDL 48.70 09/24/2023   LDLDIRECT 102.0 07/15/2022   LDLCALC 116 (H) 09/24/2023   ALT 46 09/24/2023   AST 30 09/24/2023   NA 141 09/24/2023   K 4.0 09/24/2023   CL 103 09/24/2023   CREATININE 0.71 09/24/2023   BUN 14 09/24/2023   CO2 28 09/24/2023   TSH 1.84 09/24/2023   PSA 0.60 09/24/2023   HGBA1C 4.5 (L) 07/10/2021    CT BIOPSY  Result Date: 10/22/2021 CLINICAL DATA:  Normocytic anemia of unclear etiology EXAM: CT GUIDED DEEP ILIAC BONE ASPIRATION AND CORE BIOPSY TECHNIQUE: Patient was placed prone on the CT gantry and limited axial scans through the pelvis were obtained. Appropriate skin entry site was identified. Skin site was marked, prepped with chlorhexidine, draped in usual sterile fashion, and infiltrated locally with 1% lidocaine. Intravenous  Fentanyl and Versed 4mg  were administered as conscious sedation during continuous monitoring of the patient's level of consciousness and physiological / cardiorespiratory status by the radiology RN, with a total moderate sedation time of 10 minutes. Under CT fluoroscopic guidance an 11-gauge Cook trocar bone needle was advanced into the right iliac bone just lateral to the sacroiliac joint. Once needle tip position was confirmed, core and aspiration samples were obtained, submitted to pathology for approval. Post procedure scans show no hematoma or fracture. Patient tolerated  procedure well. COMPLICATIONS: COMPLICATIONS none IMPRESSION: 1. Technically successful CT guided right iliac bone core and aspiration biopsy. Electronically Signed   By: Corlis Leak M.D.   On: 10/22/2021 15:47   CT BONE MARROW BIOPSY & ASPIRATION  Result Date: 10/22/2021 CLINICAL DATA:  Normocytic anemia of unclear etiology EXAM: CT GUIDED DEEP ILIAC BONE ASPIRATION AND CORE BIOPSY TECHNIQUE: Patient was placed prone on the CT gantry and limited axial scans through the pelvis were obtained. Appropriate skin entry site was identified. Skin site was marked, prepped with chlorhexidine, draped in usual sterile fashion, and infiltrated locally with 1% lidocaine. Intravenous Fentanyl and Versed 4mg  were administered as conscious sedation during continuous monitoring of the patient's level of consciousness and physiological / cardiorespiratory status by the radiology RN, with a total moderate sedation time of 10 minutes. Under CT fluoroscopic guidance an 11-gauge Cook trocar bone needle was advanced into the right iliac bone just lateral to the sacroiliac joint. Once needle tip position was confirmed, core and aspiration samples were obtained, submitted to pathology for approval. Post procedure scans show no hematoma or fracture. Patient tolerated procedure well. COMPLICATIONS: COMPLICATIONS none IMPRESSION: 1. Technically  successful CT guided right iliac bone core and aspiration biopsy. Electronically Signed   By: Corlis Leak M.D.   On: 10/22/2021 15:47    Assessment & Plan:   Essential hypertension- He has not achieved his blood pressure goal.  Will start an ARB. -     Basic metabolic panel; Future -     CBC with Differential/Platelet; Future -     TSH; Future -     Urinalysis, Routine w reflex microscopic; Future -     Hepatic function panel; Future -     EKG 12-Lead -     Olmesartan Medoxomil; Take 1 tablet (20 mg total) by mouth daily.  Dispense: 90 tablet; Refill: 1  Pure hyperglyceridemia- Statin is not indicated. -     Lipid panel; Future -     Hepatic function panel; Future  Vitamin D deficiency -     VITAMIN D 25 Hydroxy (Vit-D Deficiency, Fractures); Future  H/O gastric sleeve  Encounter for general adult medical examination with abnormal findings- Exam completed, labs reviewed, vaccines reviewed and updated, cancer screenings addressed, pt ed material was given.  -     PSA; Future  Idiopathic chronic gout of multiple sites without tophus- He has achieved his uric acid goal. -     Uric acid; Future -     Allopurinol; Take 1 tablet (300 mg total) by mouth daily.  Dispense: 90 tablet; Refill: 1  OSA on CPAP -     Ambulatory referral to Sleep Studies  LVH (left ventricular hypertrophy) due to hypertensive disease, without heart failure -     Olmesartan Medoxomil; Take 1 tablet (20 mg total) by mouth daily.  Dispense: 90 tablet; Refill: 1  Chronic left-sided low back pain with left-sided sciatica -     tiZANidine HCl; Take 1 capsule (4 mg total) by mouth 3 (three) times daily as needed for muscle spasms.  Dispense: 270 capsule; Refill: 0  Polyp of colon, unspecified part of colon, unspecified type -     Ambulatory referral to Gastroenterology     Follow-up: Return in about 6 months (around 03/23/2024).  Sanda Linger, MD

## 2023-09-24 NOTE — Patient Instructions (Signed)
Health Maintenance, Male Adopting a healthy lifestyle and getting preventive care are important in promoting health and wellness. Ask your health care provider about: The right schedule for you to have regular tests and exams. Things you can do on your own to prevent diseases and keep yourself healthy. What should I know about diet, weight, and exercise? Eat a healthy diet  Eat a diet that includes plenty of vegetables, fruits, low-fat dairy products, and lean protein. Do not eat a lot of foods that are high in solid fats, added sugars, or sodium. Maintain a healthy weight Body mass index (BMI) is a measurement that can be used to identify possible weight problems. It estimates body fat based on height and weight. Your health care provider can help determine your BMI and help you achieve or maintain a healthy weight. Get regular exercise Get regular exercise. This is one of the most important things you can do for your health. Most adults should: Exercise for at least 150 minutes each week. The exercise should increase your heart rate and make you sweat (moderate-intensity exercise). Do strengthening exercises at least twice a week. This is in addition to the moderate-intensity exercise. Spend less time sitting. Even light physical activity can be beneficial. Watch cholesterol and blood lipids Have your blood tested for lipids and cholesterol at 56 years of age, then have this test every 5 years. You may need to have your cholesterol levels checked more often if: Your lipid or cholesterol levels are high. You are older than 56 years of age. You are at high risk for heart disease. What should I know about cancer screening? Many types of cancers can be detected early and may often be prevented. Depending on your health history and family history, you may need to have cancer screening at various ages. This may include screening for: Colorectal cancer. Prostate cancer. Skin cancer. Lung  cancer. What should I know about heart disease, diabetes, and high blood pressure? Blood pressure and heart disease High blood pressure causes heart disease and increases the risk of stroke. This is more likely to develop in people who have high blood pressure readings or are overweight. Talk with your health care provider about your target blood pressure readings. Have your blood pressure checked: Every 3-5 years if you are 18-39 years of age. Every year if you are 40 years old or older. If you are between the ages of 65 and 75 and are a current or former smoker, ask your health care provider if you should have a one-time screening for abdominal aortic aneurysm (AAA). Diabetes Have regular diabetes screenings. This checks your fasting blood sugar level. Have the screening done: Once every three years after age 45 if you are at a normal weight and have a low risk for diabetes. More often and at a younger age if you are overweight or have a high risk for diabetes. What should I know about preventing infection? Hepatitis B If you have a higher risk for hepatitis B, you should be screened for this virus. Talk with your health care provider to find out if you are at risk for hepatitis B infection. Hepatitis C Blood testing is recommended for: Everyone born from 1945 through 1965. Anyone with known risk factors for hepatitis C. Sexually transmitted infections (STIs) You should be screened each year for STIs, including gonorrhea and chlamydia, if: You are sexually active and are younger than 56 years of age. You are older than 56 years of age and your   health care provider tells you that you are at risk for this type of infection. Your sexual activity has changed since you were last screened, and you are at increased risk for chlamydia or gonorrhea. Ask your health care provider if you are at risk. Ask your health care provider about whether you are at high risk for HIV. Your health care provider  may recommend a prescription medicine to help prevent HIV infection. If you choose to take medicine to prevent HIV, you should first get tested for HIV. You should then be tested every 3 months for as long as you are taking the medicine. Follow these instructions at home: Alcohol use Do not drink alcohol if your health care provider tells you not to drink. If you drink alcohol: Limit how much you have to 0-2 drinks a day. Know how much alcohol is in your drink. In the [place] one drink equals one 12 oz bottle of beer (355 mL), one 5 oz glass of wine (148 mL), or one 1 oz glass of hard liquor (44 mL). Lifestyle Do not use any products that contain nicotine or tobacco. These products include cigarettes, chewing tobacco, and vaping devices, such as e-cigarettes. If you need help quitting, ask your health care provider. Do not use street drugs. Do not share needles. Ask your health care provider for help if you need support or information about quitting drugs. General instructions Schedule regular health, dental, and eye exams. Stay current with your vaccines. Tell your health care provider if: You often feel depressed. You have ever been abused or do not feel safe at home. Summary Adopting a healthy lifestyle and getting preventive care are important in promoting health and wellness. Follow your health care provider's instructions about healthy diet, exercising, and getting tested or screened for diseases. Follow your health care provider's instructions on monitoring your cholesterol and blood pressure. This information is not intended to replace advice given to you by your health care provider. Make sure you discuss any questions you have with your health care provider. Document Revised: 04/01/2021 Document Reviewed: 04/01/2021 Elsevier Patient Education  2024 [employer]  

## 2023-11-10 ENCOUNTER — Other Ambulatory Visit: Payer: Self-pay | Admitting: Internal Medicine

## 2023-11-10 DIAGNOSIS — G8929 Other chronic pain: Secondary | ICD-10-CM

## 2024-01-24 ENCOUNTER — Other Ambulatory Visit: Payer: Self-pay | Admitting: Internal Medicine

## 2024-01-24 DIAGNOSIS — I10 Essential (primary) hypertension: Secondary | ICD-10-CM

## 2024-01-24 DIAGNOSIS — I119 Hypertensive heart disease without heart failure: Secondary | ICD-10-CM

## 2024-01-24 DIAGNOSIS — M1A09X0 Idiopathic chronic gout, multiple sites, without tophus (tophi): Secondary | ICD-10-CM

## 2024-03-23 ENCOUNTER — Encounter: Payer: Self-pay | Admitting: Internal Medicine

## 2024-03-23 ENCOUNTER — Ambulatory Visit (INDEPENDENT_AMBULATORY_CARE_PROVIDER_SITE_OTHER)

## 2024-03-23 ENCOUNTER — Ambulatory Visit: Payer: 59 | Admitting: Internal Medicine

## 2024-03-23 VITALS — BP 138/88 | HR 54 | Temp 98.0°F | Ht 71.0 in | Wt 247.0 lb

## 2024-03-23 DIAGNOSIS — M1A09X0 Idiopathic chronic gout, multiple sites, without tophus (tophi): Secondary | ICD-10-CM | POA: Diagnosis not present

## 2024-03-23 DIAGNOSIS — I10 Essential (primary) hypertension: Secondary | ICD-10-CM

## 2024-03-23 DIAGNOSIS — S6992XA Unspecified injury of left wrist, hand and finger(s), initial encounter: Secondary | ICD-10-CM

## 2024-03-23 LAB — BASIC METABOLIC PANEL WITH GFR
BUN: 16 mg/dL (ref 6–23)
CO2: 28 meq/L (ref 19–32)
Calcium: 9.2 mg/dL (ref 8.4–10.5)
Chloride: 106 meq/L (ref 96–112)
Creatinine, Ser: 0.72 mg/dL (ref 0.40–1.50)
GFR: 101.69 mL/min (ref >60.00)
Glucose, Bld: 92 mg/dL (ref 70–99)
Potassium: 3.9 meq/L (ref 3.5–5.1)
Sodium: 140 meq/L (ref 135–145)

## 2024-03-23 LAB — CBC WITH DIFFERENTIAL/PLATELET
Basophils Absolute: 0.1 10*3/uL (ref 0.0–0.1)
Basophils Relative: 1.1 % (ref 0.0–3.0)
Eosinophils Absolute: 0.1 10*3/uL (ref 0.0–0.7)
Eosinophils Relative: 2 % (ref 0.0–5.0)
HCT: 38.5 % — ABNORMAL LOW (ref 39.0–52.0)
Hemoglobin: 13.3 g/dL (ref 13.0–17.0)
Lymphocytes Relative: 43.3 % (ref 12.0–46.0)
Lymphs Abs: 2.2 10*3/uL (ref 0.7–4.0)
MCHC: 34.6 g/dL (ref 30.0–36.0)
MCV: 92.3 fl (ref 78.0–100.0)
Monocytes Absolute: 0.4 10*3/uL (ref 0.1–1.0)
Monocytes Relative: 8.1 % (ref 3.0–12.0)
Neutro Abs: 2.3 10*3/uL (ref 1.4–7.7)
Neutrophils Relative %: 45.5 % (ref 43.0–77.0)
Platelets: 224 10*3/uL (ref 150.0–400.0)
RBC: 4.17 Mil/uL — ABNORMAL LOW (ref 4.22–5.81)
RDW: 13.6 % (ref 11.5–15.5)
WBC: 5.2 10*3/uL (ref 4.0–10.5)

## 2024-03-23 LAB — URIC ACID: Uric Acid, Serum: 4.7 mg/dL (ref 4.0–7.8)

## 2024-03-23 MED ORDER — OLMESARTAN MEDOXOMIL 40 MG PO TABS: 40.0000 mg | ORAL_TABLET | Freq: Every day | ORAL | 0 refills | Status: DC

## 2024-03-23 NOTE — Progress Notes (Signed)
 Subjective:  Patient ID: Rickey Hurley, male    DOB: [birth year]  Age: 58 y.o. MRN: [id number]  CC: Follow-up (6 month follow up, BP check), Hand Pain (Middle finger, got it caught in a wood chipper, finger bend backwards. happened 6 weeks ago), and Hypertension   HPI Rickey Hurley presents for f/up ----  Discussed the use of AI scribe software for clinical note transcription with the patient, who gave verbal consent to proceed.  History of Present Illness   Rickey Hurley is a 58 year old male who presents with left middle finger pain and possible fracture.  He has been experiencing pain and tenderness in his left middle finger following an injury that occurred six weeks ago. The incident involved his finger getting caught in a twig breaker of a wood chipper while it was off, resulting in significant impact. He notes a deformity in the finger and experiences pain when bending or straightening it, although initially, it was not able to bend well. No numbness is present, but he reports weakness in the finger. He has been taking naproxen in the morning for pain management, which he states helps significantly.  He has a history of low heart rate in the fifties, which does not cause any symptoms such as dizziness or lightheadedness. He remains active, engaging in activities like using a push mower for extended periods without experiencing chest pain or shortness of breath.       Outpatient Medications Prior to Visit  Medication Sig Dispense Refill   allopurinol  (ZYLOPRIM ) 300 MG tablet TAKE 1 TABLET DAILY 90 tablet 1   Multiple Vitamin (MULTIVITAMIN ADULT PO) Multivitamin     Omega-3 Fatty Acids (FISH OIL OMEGA-3 PO) 1,400 mg.     tiZANidine  (ZANAFLEX ) 4 MG capsule Take 1 capsule (4 mg total) by mouth 3 (three) times daily as needed for muscle spasms. 270 capsule 1   olmesartan  (BENICAR ) 20 MG tablet TAKE 1 TABLET DAILY 90 tablet 1   No facility-administered medications prior to visit.     ROS Review of Systems  Constitutional: Negative.  Negative for appetite change, chills, diaphoresis, fatigue and fever.  HENT: Negative.    Eyes: Negative.   Respiratory: Negative.  Negative for chest tightness, shortness of breath and wheezing.   Cardiovascular:  Negative for chest pain, palpitations and leg swelling.  Gastrointestinal:  Negative for abdominal pain, constipation, diarrhea, nausea and vomiting.  Genitourinary: Negative.  Negative for difficulty urinating.  Musculoskeletal:  Positive for arthralgias. Negative for joint swelling.  Skin: Negative.   Neurological:  Negative for dizziness, weakness and headaches.  Hematological:  Negative for adenopathy. Does not bruise/bleed easily.  Psychiatric/Behavioral: Negative.      Objective:  BP 138/88 (BP Location: Left Arm, Patient Position: Sitting)   Pulse (!) 54   Temp 98 F (36.7 C) (Temporal)   Ht 5\' 11"  (1.803 m)   Wt 247 lb (112 kg)   SpO2 100%   BMI 34.45 kg/m   BP Readings from Last 3 Encounters:  03/23/24 138/88  09/24/23 (!) 148/94  07/15/22 126/88    Wt Readings from Last 3 Encounters:  03/23/24 247 lb (112 kg)  09/24/23 235 lb 11.2 oz (106.9 kg)  07/15/22 231 lb 6.4 oz (105 kg)    Physical Exam Vitals reviewed.  Constitutional:      Appearance: Normal appearance.  HENT:     Mouth/Throat:     Mouth: Mucous membranes are moist.  Eyes:     General: No scleral icterus.  Conjunctiva/sclera: Conjunctivae normal.  Cardiovascular:     Rate and Rhythm: Regular rhythm. Bradycardia present.     Heart sounds: No murmur heard.    No friction rub. No gallop.  Pulmonary:     Effort: Pulmonary effort is normal.     Breath sounds: No stridor. No wheezing, rhonchi or rales.  Abdominal:     General: Abdomen is flat.     Palpations: There is no mass.     Tenderness: There is no abdominal tenderness. There is no guarding.     Hernia: No hernia is present.  Musculoskeletal:        General: Deformity  present. Normal range of motion.     Cervical back: Neck supple.     Right lower leg: No edema.     Comments: LMF --- there is a palpable deformity on the dorsum, middle phalanx  Lymphadenopathy:     Cervical: No cervical adenopathy.  Skin:    General: Skin is dry.  Neurological:     General: No focal deficit present.     Mental Status: He is alert. Mental status is at baseline.  Psychiatric:        Mood and Affect: Mood normal.        Behavior: Behavior normal.     Lab Results  Component Value Date   WBC 5.2 03/23/2024   HGB 13.3 03/23/2024   HCT 38.5 (L) 03/23/2024   PLT 224.0 03/23/2024   GLUCOSE 92 03/23/2024   CHOL 195 09/24/2023   TRIG 154.0 (H) 09/24/2023   HDL 48.70 09/24/2023   LDLDIRECT 102.0 07/15/2022   LDLCALC 116 (H) 09/24/2023   ALT 46 09/24/2023   AST 30 09/24/2023   NA 140 03/23/2024   K 3.9 03/23/2024   CL 106 03/23/2024   CREATININE 0.72 03/23/2024   BUN 16 03/23/2024   CO2 28 03/23/2024   TSH 1.84 09/24/2023   PSA 0.60 09/24/2023   HGBA1C 4.5 (L) 07/10/2021    CT BIOPSY Result Date: 10/22/2021 CLINICAL DATA:  Normocytic anemia of unclear etiology EXAM: CT GUIDED DEEP ILIAC BONE ASPIRATION AND CORE BIOPSY TECHNIQUE: Patient was placed prone on the CT gantry and limited axial scans through the pelvis were obtained. Appropriate skin entry site was identified. Skin site was marked, prepped with chlorhexidine , draped in usual sterile fashion, and infiltrated locally with 1% lidocaine . Intravenous Fentanyl  100mcg and Versed  4mg  were administered as conscious sedation during continuous monitoring of the patient's level of consciousness and physiological / cardiorespiratory status by the radiology RN, with a total moderate sedation time of 10 minutes. Under CT fluoroscopic guidance an 11-gauge Cook trocar bone needle was advanced into the right iliac bone just lateral to the sacroiliac joint. Once needle tip position was confirmed, core and aspiration samples  were obtained, submitted to pathology for approval. Post procedure scans show no hematoma or fracture. Patient tolerated procedure well. COMPLICATIONS: COMPLICATIONS none IMPRESSION: 1. Technically successful CT guided right iliac bone core and aspiration biopsy. Electronically Signed   By: Nicoletta Barrier M.D.   On: 10/22/2021 15:47   CT BONE MARROW BIOPSY & ASPIRATION Result Date: 10/22/2021 CLINICAL DATA:  Normocytic anemia of unclear etiology EXAM: CT GUIDED DEEP ILIAC BONE ASPIRATION AND CORE BIOPSY TECHNIQUE: Patient was placed prone on the CT gantry and limited axial scans through the pelvis were obtained. Appropriate skin entry site was identified. Skin site was marked, prepped with chlorhexidine , draped in usual sterile fashion, and infiltrated locally with 1% lidocaine . Intravenous Fentanyl  100mcg  and Versed  4mg  were administered as conscious sedation during continuous monitoring of the patient's level of consciousness and physiological / cardiorespiratory status by the radiology RN, with a total moderate sedation time of 10 minutes. Under CT fluoroscopic guidance an 11-gauge Cook trocar bone needle was advanced into the right iliac bone just lateral to the sacroiliac joint. Once needle tip position was confirmed, core and aspiration samples were obtained, submitted to pathology for approval. Post procedure scans show no hematoma or fracture. Patient tolerated procedure well. COMPLICATIONS: COMPLICATIONS none IMPRESSION: 1. Technically successful CT guided right iliac bone core and aspiration biopsy. Electronically Signed   By: Nicoletta Barrier M.D.   On: 10/22/2021 15:47    DG Finger Middle Left Result Date: 03/23/2024 CLINICAL DATA:  injured 6 weeks ago EXAM: LEFT MIDDLE FINGER 2+V COMPARISON:  None Available. FINDINGS: No acute fracture or dislocation. There is no evidence of arthropathy or other focal bone abnormality. Mild soft tissue swelling about the distal phalanx of the 3rd digit. No radiopaque  foreign body. IMPRESSION: Mild soft tissue swelling about the distal phalanx of the 3rd digit (middle finger). No acute fracture or dislocation. Electronically Signed   By: Rance Burrows M.D.   On: 03/23/2024 09:07     Assessment & Plan:   Essential hypertension- He has not achieved his BP goal of 130/80. Will increase the ARB dose. -     Basic metabolic panel with GFR; Future -     CBC with Differential/Platelet; Future -     Olmesartan  Medoxomil; Take 1 tablet (40 mg total) by mouth daily.  Dispense: 90 tablet; Refill: 0  Idiopathic chronic gout of multiple sites without tophus -     Uric acid; Future  Injury of left middle finger, initial encounter -     DG Finger Middle Left; Future -     Ambulatory referral to Orthopedic Surgery     Follow-up: Return in about 6 months (around 09/22/2024).  Sandra Crouch, MD

## 2024-03-23 NOTE — Patient Instructions (Signed)
 Hypertension, Adult High blood pressure (hypertension) is when the force of blood pumping through the arteries is too strong. The arteries are the blood vessels that carry blood from the heart throughout the body. Hypertension forces the heart to work harder to pump blood and may cause arteries to become narrow or stiff. Untreated or uncontrolled hypertension can lead to a heart attack, heart failure, a stroke, kidney disease, and other problems. A blood pressure reading consists of a higher number over a lower number. Ideally, your blood pressure should be below 120/80. The first ("top") number is called the systolic pressure. It is a measure of the pressure in your arteries as your heart beats. The second ("bottom") number is called the diastolic pressure. It is a measure of the pressure in your arteries as the heart relaxes. What are the causes? The exact cause of this condition is not known. There are some conditions that result in high blood pressure. What increases the risk? Certain factors may make you more likely to develop high blood pressure. Some of these risk factors are under your control, including: Smoking. Not getting enough exercise or physical activity. Being overweight. Having too much fat, sugar, calories, or salt (sodium) in your diet. Drinking too much alcohol. Other risk factors include: Having a personal history of heart disease, diabetes, high cholesterol, or kidney disease. Stress. Having a family history of high blood pressure and high cholesterol. Having obstructive sleep apnea. Age. The risk increases with age. What are the signs or symptoms? High blood pressure may not cause symptoms. Very high blood pressure (hypertensive crisis) may cause: Headache. Fast or irregular heartbeats (palpitations). Shortness of breath. Nosebleed. Nausea and vomiting. Vision changes. Severe chest pain, dizziness, and seizures. How is this diagnosed? This condition is diagnosed by  measuring your blood pressure while you are seated, with your arm resting on a flat surface, your legs uncrossed, and your feet flat on the floor. The cuff of the blood pressure monitor will be placed directly against the skin of your upper arm at the level of your heart. Blood pressure should be measured at least twice using the same arm. Certain conditions can cause a difference in blood pressure between your right and left arms. If you have a high blood pressure reading during one visit or you have normal blood pressure with other risk factors, you may be asked to: Return on a different day to have your blood pressure checked again. Monitor your blood pressure at home for 1 week or longer. If you are diagnosed with hypertension, you may have other blood or imaging tests to help your health care provider understand your overall risk for other conditions. How is this treated? This condition is treated by making healthy lifestyle changes, such as eating healthy foods, exercising more, and reducing your alcohol intake. You may be referred for counseling on a healthy diet and physical activity. Your health care provider may prescribe medicine if lifestyle changes are not enough to get your blood pressure under control and if: Your systolic blood pressure is above 130. Your diastolic blood pressure is above 80. Your personal target blood pressure may vary depending on your medical conditions, your age, and other factors. Follow these instructions at home: Eating and drinking  Eat a diet that is high in fiber and potassium, and low in sodium, added sugar, and fat. An example of this eating plan is called the DASH diet. DASH stands for Dietary Approaches to Stop Hypertension. To eat this way: Eat  plenty of fresh fruits and vegetables. Try to fill one half of your plate at each meal with fruits and vegetables. Eat whole grains, such as whole-wheat pasta, brown rice, or whole-grain bread. Fill about one  fourth of your plate with whole grains. Eat or drink low-fat dairy products, such as skim milk or low-fat yogurt. Avoid fatty cuts of meat, processed or cured meats, and poultry with skin. Fill about one fourth of your plate with lean proteins, such as fish, chicken without skin, beans, eggs, or tofu. Avoid pre-made and processed foods. These tend to be higher in sodium, added sugar, and fat. Reduce your daily sodium intake. Many people with hypertension should eat less than 1,500 mg of sodium a day. Do not drink alcohol if: Your health care provider tells you not to drink. You are pregnant, may be pregnant, or are planning to become pregnant. If you drink alcohol: Limit how much you have to: 0-1 drink a day for women. 0-2 drinks a day for men. Know how much alcohol is in your drink. In the [place] one drink equals one 12 oz bottle of beer (355 mL), one 5 oz glass of wine (148 mL), or one 1 oz glass of hard liquor (44 mL). Lifestyle  Work with your health care provider to maintain a healthy body weight or to lose weight. Ask what an ideal weight is for you. Get at least 30 minutes of exercise that causes your heart to beat faster (aerobic exercise) most days of the week. Activities may include walking, swimming, or biking. Include exercise to strengthen your muscles (resistance exercise), such as Pilates or lifting weights, as part of your weekly exercise routine. Try to do these types of exercises for 30 minutes at least 3 days a week. Do not use any products that contain nicotine or tobacco. These products include cigarettes, chewing tobacco, and vaping devices, such as e-cigarettes. If you need help quitting, ask your health care provider. Monitor your blood pressure at home as told by your health care provider. Keep all follow-up visits. This is important. Medicines Take over-the-counter and prescription medicines only as told by your health care provider. Follow directions carefully. Blood  pressure medicines must be taken as prescribed. Do not skip doses of blood pressure medicine. Doing this puts you at risk for problems and can make the medicine less effective. Ask your health care provider about side effects or reactions to medicines that you should watch for. Contact a health care provider if you: Think you are having a reaction to a medicine you are taking. Have headaches that keep coming back (recurring). Feel dizzy. Have swelling in your ankles. Have trouble with your vision. Get help right away if you: Develop a severe headache or confusion. Have unusual weakness or numbness. Feel faint. Have severe pain in your chest or abdomen. Vomit repeatedly. Have trouble breathing. These symptoms may be an emergency. Get help right away. Call 911. Do not wait to see if the symptoms will go away. Do not drive yourself to the hospital. Summary Hypertension is when the force of blood pumping through your arteries is too strong. If this condition is not controlled, it may put you at risk for serious complications. Your personal target blood pressure may vary depending on your medical conditions, your age, and other factors. For most people, a normal blood pressure is less than 120/80. Hypertension is treated with lifestyle changes, medicines, or a combination of both. Lifestyle changes include losing weight, eating a healthy,  low-sodium diet, exercising more, and limiting alcohol. This information is not intended to replace advice given to you by your health care provider. Make sure you discuss any questions you have with your health care provider. Document Revised: 09/17/2021 Document Reviewed: 09/17/2021 Elsevier Patient Education  2024 [employer].

## 2024-03-25 ENCOUNTER — Encounter: Payer: Self-pay | Admitting: Internal Medicine

## 2024-03-25 ENCOUNTER — Ambulatory Visit (INDEPENDENT_AMBULATORY_CARE_PROVIDER_SITE_OTHER)

## 2024-03-25 ENCOUNTER — Ambulatory Visit: Admitting: Internal Medicine

## 2024-03-25 VITALS — BP 140/88 | HR 80 | Temp 98.2°F | Resp 16 | Wt 246.0 lb

## 2024-03-25 DIAGNOSIS — R310 Gross hematuria: Secondary | ICD-10-CM | POA: Diagnosis not present

## 2024-03-25 DIAGNOSIS — N200 Calculus of kidney: Secondary | ICD-10-CM

## 2024-03-25 DIAGNOSIS — I10 Essential (primary) hypertension: Secondary | ICD-10-CM | POA: Diagnosis not present

## 2024-03-25 DIAGNOSIS — R109 Unspecified abdominal pain: Secondary | ICD-10-CM

## 2024-03-25 LAB — URINALYSIS, ROUTINE W REFLEX MICROSCOPIC
Bilirubin Urine: NEGATIVE
Ketones, ur: NEGATIVE
Nitrite: NEGATIVE
Specific Gravity, Urine: 1.015 (ref 1.000–1.030)
Total Protein, Urine: NEGATIVE
Urine Glucose: NEGATIVE
Urobilinogen, UA: 1 (ref 0.0–1.0)
pH: 6 (ref 5.0–8.0)

## 2024-03-25 MED ORDER — OXYCODONE-ACETAMINOPHEN 7.5-325 MG PO TABS: 1.0000 | ORAL_TABLET | Freq: Three times a day (TID) | ORAL | 0 refills | Status: DC | PRN

## 2024-03-25 MED ORDER — TAMSULOSIN HCL 0.4 MG PO CAPS: 0.4000 mg | ORAL_CAPSULE | Freq: Every day | ORAL | 0 refills | Status: DC

## 2024-03-25 MED ORDER — PROMETHAZINE HCL 12.5 MG PO TABS: 12.5000 mg | ORAL_TABLET | Freq: Four times a day (QID) | ORAL | 0 refills | Status: DC | PRN

## 2024-03-25 NOTE — Patient Instructions (Signed)
 Flank Pain, Adult  Flank pain is pain that is located on the side of the body between the upper abdomen and the spine. This area is called the flank. The pain may occur over a short period of time (acute), or it may be long-term or recurring (chronic). It may be mild or severe. Flank pain can be caused by many things, including:  Muscle soreness or injury.  Kidney infection, kidney stones, or kidney disease.  Stress.  A disease of the spine (vertebral disk disease).  A lung infection (pneumonia).  Fluid around the lungs (pulmonary edema).  A skin rash caused by the chickenpox virus (shingles).  Tumors that affect the back of the abdomen.  Gallbladder disease.  Follow these instructions at home:    Drink enough fluid to keep your urine pale yellow.  Rest as told by your health care provider.  Take over-the-counter and prescription medicines only as told by your health care provider.  Keep a journal to track what has caused your flank pain and what has made it feel better.  Keep all follow-up visits. This is important.  Contact a health care provider if:  Your pain is not controlled with medicine.  You have new symptoms.  Your pain gets worse.  Your symptoms last longer than 2-3 days.  You have trouble urinating or you are urinating very frequently.  Get help right away if:  You have trouble breathing or you are short of breath.  Your abdomen hurts or it is swollen or red.  You have nausea or vomiting.  You feel faint, or you faint.  You have blood in your urine.  You have flank pain and a fever.  These symptoms may represent a serious problem that is an emergency. Do not wait to see if the symptoms will go away. Get medical help right away. Call your local emergency services (911 in the [place] Do not drive yourself to the hospital.  Summary  Flank pain is pain that is located on the side of the body between the upper abdomen and the spine.  The pain may occur over a short period of time (acute), or it may be  long-term or recurring (chronic). It may be mild or severe.  Flank pain can be caused by many things.  Contact your health care provider if your symptoms get worse or last longer than 2-3 days.  This information is not intended to replace advice given to you by your health care provider. Make sure you discuss any questions you have with your health care provider.  Document Revised: 01/21/2021 Document Reviewed: 01/21/2021  Elsevier Patient Education  2024 [employer].

## 2024-03-25 NOTE — Progress Notes (Unsigned)
 Subjective:  Patient ID: Rickey Hurley, male    DOB: 25-Nov-1965  Age: 58 y.o. MRN: [id number]  CC: Abdominal Pain, Hypertension, and Anemia   HPI Gillermo Paller presents for f/up ----  Discussed the use of AI scribe software for clinical note transcription with the patient, who gave verbal consent to proceed.  History of Present Illness   Rickey Hurley is a 58 year old male who presents with left flank pain for 2 days, likely due to a kidney stone.  He has been experiencing left flank pain, which began with hematuria, followed by severe, colicky pain that worsened overnight, peaking several times before improving after about six hours when he felt something moving down.  He experienced associated symptoms of nausea, vomiting, fever, and chills. He vomited twice and felt hot and cold but did not measure his temperature. He continues to feel nauseated, though not as severely as initially.  He did not take any pain medication during the night of the pain onset as he did not have any available. The following day, he took tramadol , which provided significant relief. He mentions passing what appeared to be a clot and describes the pain as a 'unique amount of pain'.  He has not had a fever since the initial episode, with a recent temperature reading of 98.34F. No current fever, nausea, vomiting, or chills.       Outpatient Medications Prior to Visit  Medication Sig Dispense Refill   allopurinol  (ZYLOPRIM ) 300 MG tablet TAKE 1 TABLET DAILY 90 tablet 1   Multiple Vitamin (MULTIVITAMIN ADULT PO) Multivitamin     olmesartan  (BENICAR ) 40 MG tablet Take 1 tablet (40 mg total) by mouth daily. 90 tablet 0   Omega-3 Fatty Acids (FISH OIL OMEGA-3 PO) 1,400 mg.     tiZANidine  (ZANAFLEX ) 4 MG capsule Take 1 capsule (4 mg total) by mouth 3 (three) times daily as needed for muscle spasms. 270 capsule 1   No facility-administered medications prior to visit.    ROS Review of Systems  Constitutional:  Positive  for chills. Negative for fatigue and fever.  HENT: Negative.    Eyes: Negative.   Respiratory: Negative.  Negative for cough and shortness of breath.   Cardiovascular:  Negative for chest pain, palpitations and leg swelling.  Gastrointestinal:  Positive for nausea. Negative for abdominal pain, blood in stool, constipation, diarrhea and vomiting.  Genitourinary:  Positive for flank pain and hematuria. Negative for difficulty urinating and dysuria.  Skin: Negative.   Neurological: Negative.   Hematological:  Negative for adenopathy. Does not bruise/bleed easily.  Psychiatric/Behavioral: Negative.      Objective:  BP (!) 140/88 (BP Location: Left Arm, Patient Position: Sitting, Cuff Size: Large)   Pulse 80   Temp 98.2 F (36.8 C) (Oral)   Resp 16   Wt 246 lb (111.6 kg)   SpO2 97%   BMI 34.31 kg/m   BP Readings from Last 3 Encounters:  03/25/24 (!) 140/88  03/23/24 138/88  09/24/23 (!) 148/94    Wt Readings from Last 3 Encounters:  03/25/24 246 lb (111.6 kg)  03/23/24 247 lb (112 kg)  09/24/23 235 lb 11.2 oz (106.9 kg)    Physical Exam Vitals reviewed.  Constitutional:      Appearance: He is not ill-appearing.  Eyes:     General: No scleral icterus.    Conjunctiva/sclera: Conjunctivae normal.  Cardiovascular:     Rate and Rhythm: Normal rate and regular rhythm.     Heart sounds: No  murmur heard.    No friction rub. No gallop.  Abdominal:     General: Abdomen is protuberant. Bowel sounds are normal. There is no distension.     Palpations: Abdomen is soft. There is no hepatomegaly, splenomegaly or mass.     Tenderness: There is no abdominal tenderness. There is left CVA tenderness. There is no right CVA tenderness, guarding or rebound.  Musculoskeletal:        General: Normal range of motion.     Cervical back: Neck supple.     Right lower leg: No edema.     Left lower leg: No edema.  Lymphadenopathy:     Cervical: No cervical adenopathy.  Skin:    General: Skin  is warm and dry.  Neurological:     General: No focal deficit present.     Mental Status: He is alert. Mental status is at baseline.     Lab Results  Component Value Date   WBC 5.2 03/23/2024   HGB 13.3 03/23/2024   HCT 38.5 (L) 03/23/2024   PLT 224.0 03/23/2024   GLUCOSE 92 03/23/2024   CHOL 195 09/24/2023   TRIG 154.0 (H) 09/24/2023   HDL 48.70 09/24/2023   LDLDIRECT 102.0 07/15/2022   LDLCALC 116 (H) 09/24/2023   ALT 46 09/24/2023   AST 30 09/24/2023   NA 140 03/23/2024   K 3.9 03/23/2024   CL 106 03/23/2024   CREATININE 0.72 03/23/2024   BUN 16 03/23/2024   CO2 28 03/23/2024   TSH 1.84 09/24/2023   PSA 0.60 09/24/2023   HGBA1C 4.5 (L) 07/10/2021    CT BIOPSY Result Date: 10/22/2021 CLINICAL DATA:  Normocytic anemia of unclear etiology EXAM: CT GUIDED DEEP ILIAC BONE ASPIRATION AND CORE BIOPSY TECHNIQUE: Patient was placed prone on the CT gantry and limited axial scans through the pelvis were obtained. Appropriate skin entry site was identified. Skin site was marked, prepped with chlorhexidine , draped in usual sterile fashion, and infiltrated locally with 1% lidocaine . Intravenous Fentanyl  100mcg and Versed  4mg  were administered as conscious sedation during continuous monitoring of the patient's level of consciousness and physiological / cardiorespiratory status by the radiology RN, with a total moderate sedation time of 10 minutes. Under CT fluoroscopic guidance an 11-gauge Cook trocar bone needle was advanced into the right iliac bone just lateral to the sacroiliac joint. Once needle tip position was confirmed, core and aspiration samples were obtained, submitted to pathology for approval. Post procedure scans show no hematoma or fracture. Patient tolerated procedure well. COMPLICATIONS: COMPLICATIONS none IMPRESSION: 1. Technically successful CT guided right iliac bone core and aspiration biopsy. Electronically Signed   By: Nicoletta Barrier M.D.   On: 10/22/2021 15:47   CT BONE  MARROW BIOPSY & ASPIRATION Result Date: 10/22/2021 CLINICAL DATA:  Normocytic anemia of unclear etiology EXAM: CT GUIDED DEEP ILIAC BONE ASPIRATION AND CORE BIOPSY TECHNIQUE: Patient was placed prone on the CT gantry and limited axial scans through the pelvis were obtained. Appropriate skin entry site was identified. Skin site was marked, prepped with chlorhexidine , draped in usual sterile fashion, and infiltrated locally with 1% lidocaine . Intravenous Fentanyl  100mcg and Versed  4mg  were administered as conscious sedation during continuous monitoring of the patient's level of consciousness and physiological / cardiorespiratory status by the radiology RN, with a total moderate sedation time of 10 minutes. Under CT fluoroscopic guidance an 11-gauge Cook trocar bone needle was advanced into the right iliac bone just lateral to the sacroiliac joint. Once needle tip position was confirmed,  core and aspiration samples were obtained, submitted to pathology for approval. Post procedure scans show no hematoma or fracture. Patient tolerated procedure well. COMPLICATIONS: COMPLICATIONS none IMPRESSION: 1. Technically successful CT guided right iliac bone core and aspiration biopsy. Electronically Signed   By: Nicoletta Barrier M.D.   On: 10/22/2021 15:47   DG ABD ACUTE 2+V W 1V CHEST Result Date: 03/25/2024 CLINICAL DATA:  Pain.  Vomiting, abdominal pain, blood in urine. EXAM: DG ABDOMEN ACUTE WITH 1 VIEW CHEST COMPARISON:  02/17/2018 FINDINGS: Heart size and pulmonary vascularity are normal. Lungs are clear. No pleural effusion or pneumothorax. Mediastinal contours appear intact. Surgical clips in the left upper quadrant. Gas and stool throughout the colon. No small or large bowel distention. No free intraperitoneal air. No abnormal air-fluid levels. No radiopaque stones. Degenerative changes in the spine and hips. Visualized soft tissue contours appear intact. IMPRESSION: 1. No evidence of active pulmonary disease. 2. Normal  nonobstructive bowel gas pattern. Electronically Signed   By: Boyce Byes M.D.   On: 03/25/2024 15:44       Assessment & Plan:   Acute left flank pain -     DG ABD ACUTE 2+V W 1V CHEST; Future -     CULTURE, URINE COMPREHENSIVE; Future -     Urinalysis, Routine w reflex microscopic; Future -     CT RENAL STONE STUDY; Future  Kidney stone on left side- -     Tamsulosin HCl; Take 1 capsule (0.4 mg total) by mouth daily.  Dispense: 30 capsule; Refill: 0 -     oxyCODONE -Acetaminophen ; Take 1 tablet by mouth every 8 (eight) hours as needed.  Dispense: 25 tablet; Refill: 0 -     Promethazine  HCl; Take 1 tablet (12.5 mg total) by mouth every 6 (six) hours as needed for nausea or vomiting.  Dispense: 30 tablet; Refill: 0 -     CT RENAL STONE STUDY; Future -     Indapamide ; Take 1 tablet (1.25 mg total) by mouth daily.  Dispense: 90 tablet; Refill: 0  Gross hematuria- Will evaluate for RCC, cyst, stones, hydronephrosis. -     CT RENAL STONE STUDY; Future  Abdominal pain, unspecified abdominal location  Essential hypertension- He has not achieved his BP goal. Will add a thiazide diuretic. -     Indapamide ; Take 1 tablet (1.25 mg total) by mouth daily.  Dispense: 90 tablet; Refill: 0     Follow-up: Return in about 3 months (around 06/25/2024).  Sandra Crouch, MD

## 2024-03-28 ENCOUNTER — Telehealth: Payer: Self-pay | Admitting: Internal Medicine

## 2024-03-28 LAB — CULTURE, URINE COMPREHENSIVE: RESULT:: NO GROWTH

## 2024-03-28 MED ORDER — INDAPAMIDE 1.25 MG PO TABS: 1.2500 mg | ORAL_TABLET | Freq: Every day | ORAL | 0 refills | Status: DC

## 2024-03-28 NOTE — Telephone Encounter (Signed)
 Copied from CRM [id number]. Topic: Referral - Question >> Mar 28, 2024  1:41 PM [place] E wrote: Reason for CRM: Patient questioning if his radiology referral could get sent to [facility] instead as it is $168 there, instead of over $1,000 where the referral was previously sent to. [facility] fax number is [phone].  ---  Please send pt's info to: [facility]

## 2024-03-28 NOTE — Telephone Encounter (Signed)
 Patient was seen in office Friday.

## 2024-03-28 NOTE — Addendum Note (Signed)
 Addended by: Arcadio Knuckles on: 03/28/2024 03:20 PM   Modules accepted: Orders

## 2024-03-30 NOTE — Telephone Encounter (Signed)
This has been sent electronically 

## 2024-04-06 ENCOUNTER — Ambulatory Visit: Payer: Self-pay | Admitting: Internal Medicine

## 2024-04-06 ENCOUNTER — Other Ambulatory Visit: Payer: Self-pay | Admitting: Internal Medicine

## 2024-04-06 DIAGNOSIS — N200 Calculus of kidney: Secondary | ICD-10-CM

## 2024-04-06 DIAGNOSIS — N281 Cyst of kidney, acquired: Secondary | ICD-10-CM | POA: Insufficient documentation

## 2024-04-20 ENCOUNTER — Ambulatory Visit: Admitting: Internal Medicine

## 2024-05-24 ENCOUNTER — Encounter: Payer: Self-pay | Admitting: Internal Medicine

## 2024-05-25 ENCOUNTER — Other Ambulatory Visit: Payer: Self-pay | Admitting: Internal Medicine

## 2024-05-25 DIAGNOSIS — K635 Polyp of colon: Secondary | ICD-10-CM

## 2024-05-30 ENCOUNTER — Encounter: Payer: Self-pay | Admitting: Internal Medicine

## 2024-05-30 ENCOUNTER — Ambulatory Visit: Admitting: Internal Medicine

## 2024-05-30 ENCOUNTER — Ambulatory Visit: Attending: Internal Medicine

## 2024-05-30 VITALS — BP 120/76 | HR 60 | Temp 98.2°F | Ht 71.0 in | Wt 241.0 lb

## 2024-05-30 DIAGNOSIS — I10 Essential (primary) hypertension: Secondary | ICD-10-CM

## 2024-05-30 DIAGNOSIS — R001 Bradycardia, unspecified: Secondary | ICD-10-CM | POA: Diagnosis not present

## 2024-05-30 DIAGNOSIS — N410 Acute prostatitis: Secondary | ICD-10-CM

## 2024-05-30 DIAGNOSIS — R300 Dysuria: Secondary | ICD-10-CM

## 2024-05-30 DIAGNOSIS — D539 Nutritional anemia, unspecified: Secondary | ICD-10-CM | POA: Diagnosis not present

## 2024-05-30 DIAGNOSIS — D510 Vitamin B12 deficiency anemia due to intrinsic factor deficiency: Secondary | ICD-10-CM | POA: Insufficient documentation

## 2024-05-30 LAB — IBC + FERRITIN
Ferritin: 111.3 ng/mL (ref 22.0–322.0)
Iron: 151 ug/dL (ref 42–165)
Saturation Ratios: 45.9 % (ref 20.0–50.0)
TIBC: 329 ug/dL (ref 250.0–450.0)
Transferrin: 235 mg/dL (ref 212.0–360.0)

## 2024-05-30 LAB — PSA: PSA: 1.07 ng/mL (ref 0.10–4.00)

## 2024-05-30 LAB — CBC WITH DIFFERENTIAL/PLATELET
Basophils Absolute: 0.1 K/uL (ref 0.0–0.1)
Basophils Relative: 1.1 % (ref 0.0–3.0)
Eosinophils Absolute: 0.1 K/uL (ref 0.0–0.7)
Eosinophils Relative: 1.9 % (ref 0.0–5.0)
HCT: 37.8 % — ABNORMAL LOW (ref 39.0–52.0)
Hemoglobin: 13 g/dL (ref 13.0–17.0)
Lymphocytes Relative: 39.7 % (ref 12.0–46.0)
Lymphs Abs: 2.1 K/uL (ref 0.7–4.0)
MCHC: 34.4 g/dL (ref 30.0–36.0)
MCV: 90.2 fl (ref 78.0–100.0)
Monocytes Absolute: 0.6 K/uL (ref 0.1–1.0)
Monocytes Relative: 10.4 % (ref 3.0–12.0)
Neutro Abs: 2.5 K/uL (ref 1.4–7.7)
Neutrophils Relative %: 46.9 % (ref 43.0–77.0)
Platelets: 236 K/uL (ref 150.0–400.0)
RBC: 4.19 Mil/uL — ABNORMAL LOW (ref 4.22–5.81)
RDW: 14.2 % (ref 11.5–15.5)
WBC: 5.4 K/uL (ref 4.0–10.5)

## 2024-05-30 LAB — URINALYSIS, ROUTINE W REFLEX MICROSCOPIC
Bilirubin Urine: NEGATIVE
Hgb urine dipstick: NEGATIVE
Leukocytes,Ua: NEGATIVE
Nitrite: NEGATIVE
Specific Gravity, Urine: 1.025 (ref 1.000–1.030)
Total Protein, Urine: 30 — AB
Urine Glucose: NEGATIVE
Urobilinogen, UA: 1 (ref 0.0–1.0)
pH: 6 (ref 5.0–8.0)

## 2024-05-30 LAB — VITAMIN B12: Vitamin B-12: >1500 pg/mL — ABNORMAL HIGH (ref 211–911)

## 2024-05-30 LAB — TSH: TSH: 1.17 u[IU]/mL (ref 0.35–5.50)

## 2024-05-30 LAB — FOLATE: Folate: 22.9 ng/mL (ref >5.9)

## 2024-05-30 MED ORDER — SULFAMETHOXAZOLE-TRIMETHOPRIM 800-160 MG PO TABS: 1.0000 | ORAL_TABLET | Freq: Two times a day (BID) | ORAL | 0 refills | Status: AC

## 2024-05-30 NOTE — Progress Notes (Unsigned)
 EP to read.

## 2024-05-30 NOTE — Progress Notes (Signed)
 Subjective:  Patient ID: Rickey Hurley, male    DOB: 25-Mar-1966  Age: 58 y.o. MRN: [id number]  CC: Dysuria, Pain (Continuous discomfort in his prostate area.  ), and Anal Fissure (Patient states that this has been 7 weeks and it's very uncomfortable and hurts more when he uses the bathroom.  )   HPI Rickey Hurley presents for f/up ----  Discussed the use of AI scribe software for clinical note transcription with the patient, who gave verbal consent to proceed.  History of Present Illness   The patient presents with symptoms suggestive of a recurrent prostate infection.  He has been experiencing painful urination, a constant feeling of pressure, and a frequent urge to urinate for two to three weeks without improvement. There is no discharge, and he is unsure about any STI exposure. He notes a constant ache in the pelvic area, particularly behind the bladder, which he associates with the prostate.  He experiences sudden hot and clammy sensations, which are atypical for him, but denies any overt fever or chills. He does not regularly check his temperature and has not been taking any antibiotics recently.  He previously experienced dizziness when taking Flomax , a diuretic, and increased blood pressure medication, leading him to stop Flomax . Since discontinuing Flomax , he has not experienced dizziness or lightheadedness.  He has a history of an anal fissure for the past seven weeks, which bleeds intermittently. He has been using a lidocaine  cream and applying a hot washcloth to the area three to four times a day to manage symptoms. He has not yet seen a GI specialist despite a referral made last week. He prefers not to undergo a DRE today.  No flank pain, abdominal pain, nausea, vomiting, dizziness, lightheadedness, chest pain, shortness of breath, or swelling in the lower extremities. 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       Outpatient  Medications Prior to Visit  Medication Sig Dispense Refill   allopurinol  (ZYLOPRIM ) 300 MG tablet TAKE 1 TABLET DAILY 90 tablet 1   Multiple Vitamin (MULTIVITAMIN ADULT PO) Multivitamin     olmesartan  (BENICAR ) 40 MG tablet Take 1 tablet (40 mg total) by mouth daily. 90 tablet 0   Omega-3 Fatty Acids (FISH OIL OMEGA-3 PO) 1,400 mg.     tiZANidine  (ZANAFLEX ) 4 MG capsule Take 1 capsule (4 mg total) by mouth 3 (three) times daily as needed for muscle spasms. 270 capsule 1   indapamide  (LOZOL ) 1.25 MG tablet Take 1 tablet (1.25 mg total) by mouth daily. 90 tablet 0   oxyCODONE -acetaminophen  (PERCOCET) 7.5-325 MG tablet Take 1 tablet by mouth every 8 (eight) hours as needed. 25 tablet 0   promethazine  (PHENERGAN ) 12.5 MG tablet Take 1 tablet (12.5 mg total) by mouth every 6 (six) hours as needed for nausea or vomiting. 30 tablet 0   tamsulosin  (FLOMAX ) 0.4 MG CAPS capsule Take 1 capsule (0.4 mg total) by mouth daily. 30 capsule 0   No facility-administered medications prior to visit.    ROS Review of Systems  Constitutional:  Negative for appetite change, chills, diaphoresis, fatigue and fever.  HENT: Negative.    Eyes:  Negative for visual disturbance.  Respiratory: Negative.  Negative for cough, chest tightness, shortness of breath and wheezing.   Cardiovascular:  Negative for chest pain, palpitations and leg swelling.  Gastrointestinal:  Positive for anal bleeding and rectal pain. Negative for abdominal pain, blood in stool, diarrhea, nausea and vomiting.  Genitourinary:  Positive for difficulty urinating and dysuria. Negative for decreased urine volume, flank pain, hematuria, penile discharge, penile pain, penile swelling, scrotal swelling, testicular pain and urgency.  Musculoskeletal: Negative.  Negative for arthralgias and myalgias.  Skin: Negative.   Neurological:  Negative for dizziness and weakness.  Hematological:  Negative for adenopathy. Does not bruise/bleed easily.   Psychiatric/Behavioral:  Positive for sleep disturbance.     Objective:  BP 120/76 (BP Location: Left Arm, Patient Position: Sitting, Cuff Size: Normal)   Pulse 60   Temp 98.2 F (36.8 C) (Oral)   Ht 5' 11 (1.803 m)   Wt 241 lb (109.3 kg)   SpO2 99%   BMI 33.61 kg/m   BP Readings from Last 3 Encounters:  05/30/24 120/76  03/25/24 (!) 140/88  03/23/24 138/88    Wt Readings from Last 3 Encounters:  05/30/24 241 lb (109.3 kg)  03/25/24 246 lb (111.6 kg)  03/23/24 247 lb (112 kg)    Physical Exam Vitals reviewed.  Constitutional:      Appearance: He is not ill-appearing.  HENT:     Nose: Nose normal.     Mouth/Throat:     Mouth: Mucous membranes are moist.  Eyes:     General: No scleral icterus.    Conjunctiva/sclera: Conjunctivae normal.  Cardiovascular:     Rate and Rhythm: Regular rhythm. Bradycardia present.     Pulses: Normal pulses.     Heart sounds: No murmur heard.    No friction rub. No gallop.     Comments: EKG-- SB with SA, 48 bpm Minimal LVH No Q waves or ST/T wave changes Pulmonary:     Effort: Pulmonary effort is normal.     Breath sounds: No stridor. No wheezing, rhonchi or rales.  Abdominal:     General: Abdomen is flat.     Palpations: There is no mass.     Tenderness: There is no abdominal tenderness. There is no guarding.     Hernia: No hernia is present.  Genitourinary:    Comments: DRE - deferred at his request Musculoskeletal:        General: Normal range of motion.     Cervical back: Neck supple.     Right lower leg: No edema.     Left lower leg: No edema.  Skin:    General: Skin is warm and dry.  Neurological:     General: No focal deficit present.     Mental Status: He is alert.  Psychiatric:        Mood and Affect: Mood normal.        Behavior: Behavior normal.     Lab Results  Component Value Date   WBC 5.4 05/30/2024   HGB 13.0 05/30/2024   HCT 37.8 (L) 05/30/2024   PLT 236.0 05/30/2024   GLUCOSE 92 03/23/2024    CHOL 195 09/24/2023   TRIG 154.0 (H) 09/24/2023   HDL 48.70 09/24/2023   LDLDIRECT 102.0 07/15/2022   LDLCALC 116 (H) 09/24/2023   ALT 46 09/24/2023   AST 30 09/24/2023   NA 140 03/23/2024   K 3.9 03/23/2024   CL 106 03/23/2024   CREATININE 0.72 03/23/2024   BUN 16 03/23/2024   CO2 28 03/23/2024   TSH 1.17 05/30/2024   PSA 1.07 05/30/2024   HGBA1C 4.5 (L) 07/10/2021    CT BIOPSY Result Date: 10/22/2021 CLINICAL DATA:  Normocytic anemia of unclear etiology EXAM: CT GUIDED DEEP ILIAC BONE ASPIRATION AND CORE BIOPSY TECHNIQUE: Patient was  placed prone on the CT gantry and limited axial scans through the pelvis were obtained. Appropriate skin entry site was identified. Skin site was marked, prepped with chlorhexidine , draped in usual sterile fashion, and infiltrated locally with 1% lidocaine . Intravenous Fentanyl  100mcg and Versed  4mg  were administered as conscious sedation during continuous monitoring of the patient's level of consciousness and physiological / cardiorespiratory status by the radiology RN, with a total moderate sedation time of 10 minutes. Under CT fluoroscopic guidance an 11-gauge Cook trocar bone needle was advanced into the right iliac bone just lateral to the sacroiliac joint. Once needle tip position was confirmed, core and aspiration samples were obtained, submitted to pathology for approval. Post procedure scans show no hematoma or fracture. Patient tolerated procedure well. COMPLICATIONS: COMPLICATIONS none IMPRESSION: 1. Technically successful CT guided right iliac bone core and aspiration biopsy. Electronically Signed   By: JONETTA Faes M.D.   On: 10/22/2021 15:47   CT BONE MARROW BIOPSY & ASPIRATION Result Date: 10/22/2021 CLINICAL DATA:  Normocytic anemia of unclear etiology EXAM: CT GUIDED DEEP ILIAC BONE ASPIRATION AND CORE BIOPSY TECHNIQUE: Patient was placed prone on the CT gantry and limited axial scans through the pelvis were obtained. Appropriate skin entry site  was identified. Skin site was marked, prepped with chlorhexidine , draped in usual sterile fashion, and infiltrated locally with 1% lidocaine . Intravenous Fentanyl  100mcg and Versed  4mg  were administered as conscious sedation during continuous monitoring of the patient's level of consciousness and physiological / cardiorespiratory status by the radiology RN, with a total moderate sedation time of 10 minutes. Under CT fluoroscopic guidance an 11-gauge Cook trocar bone needle was advanced into the right iliac bone just lateral to the sacroiliac joint. Once needle tip position was confirmed, core and aspiration samples were obtained, submitted to pathology for approval. Post procedure scans show no hematoma or fracture. Patient tolerated procedure well. COMPLICATIONS: COMPLICATIONS none IMPRESSION: 1. Technically successful CT guided right iliac bone core and aspiration biopsy. Electronically Signed   By: JONETTA Faes M.D.   On: 10/22/2021 15:47    Assessment & Plan:  Essential hypertension- His BP is over-controlled. Will discontinue the thiazide diuretic. -     CBC with Differential/Platelet; Future -     Urinalysis, Routine w reflex microscopic; Future -     EKG 12-Lead  Deficiency anemia- Vit levels are normal. -     IBC + Ferritin; Future -     Reticulocytes; Future -     Zinc ; Future -     Vitamin B1; Future -     Methylmalonic acid, serum; Future -     Folate; Future -     Vitamin B12; Future  Dysuria -     CULTURE, URINE COMPREHENSIVE; Future -     Chlamydia/Gonococcus/Trichomonas, NAA; Future  Prostatitis, acute -     PSA; Future -     Sulfamethoxazole -Trimethoprim ; Take 1 tablet by mouth 2 (two) times daily for 21 days.  Dispense: 42 tablet; Refill: 0  Bradycardia -     TSH; Future  Bradycardia with 41-50 beats per minute -     LONG TERM MONITOR (3-14 DAYS); Future  Other orders -     EKG     Follow-up: Return in about 3 months (around 08/30/2024).  Debby Molt, MD

## 2024-05-30 NOTE — Patient Instructions (Signed)
 Bradycardia, Adult Bradycardia is a slower-than-normal heartbeat. A normal resting heart rate for an adult ranges from 60 to 100 beats per minute. With bradycardia, the resting heart rate is less than 60 beats per minute. Bradycardia can prevent enough oxygen from reaching certain areas of your body when you are active. It can be serious if it keeps enough oxygen from reaching your brain and other parts of your body. Bradycardia is not a problem for everyone. For some healthy adults, a slow resting heart rate is normal. What are the causes? This condition may be caused by: A problem with the heart, including: A problem with the heart's electrical system, such as a heart block. With a heart block, electrical signals between the chambers of the heart are partially or completely blocked, so they are not able to work as they should. A problem with the heart's natural pacemaker (sinus node). Heart disease. A heart attack. Heart damage. Lyme disease. A heart infection. A heart condition that is present at birth (congenital heart defect). Certain medicines that treat heart conditions. Certain conditions, such as hypothyroidism and obstructive sleep apnea. Problems with the balance of chemicals and other substances, like potassium, in the blood. Trauma. Radiation therapy. What increases the risk? You are more likely to develop this condition if you: Are age 51 or older. Have high blood pressure (hypertension), high cholesterol (hyperlipidemia), or diabetes. Drink heavily, use tobacco or nicotine products, or use drugs. What are the signs or symptoms? Symptoms of this condition include: Light-headedness. Feeling faint or fainting. Fatigue and weakness. Trouble with activity or exercise. Shortness of breath. Chest pain (angina). Drowsiness. Confusion. Dizziness. How is this diagnosed? This condition may be diagnosed based on: Your symptoms. Your medical history. A physical exam. During  the exam, your health care provider will listen to your heartbeat and check your pulse. To confirm the diagnosis, your health care provider may order tests, such as: Blood tests. An electrocardiogram (ECG). This test records the heart's electrical activity. The test can show how fast your heart is beating and whether the heartbeat is steady. A test in which you wear a portable device (event recorder or Holter monitor) to record your heart's electrical activity while you go about your day. An exercise test. How is this treated? Treatment for this condition depends on the cause of the condition and how severe your symptoms are. Treatment may involve: Treatment of the underlying condition. Changing your medicines or how much medicine you take. Having a small, battery-operated device called a pacemaker implanted under the skin. When bradycardia occurs, this device can be used to increase your heart rate and help your heart beat in a regular rhythm. Follow these instructions at home: Lifestyle Manage any health conditions that contribute to bradycardia as told by your health care provider. Follow a heart-healthy diet. A nutrition specialist (dietitian) can help educate you about healthy food options and changes. Follow an exercise program that is approved by your health care provider. Maintain a healthy weight. Try to reduce or manage your stress, such as with yoga or meditation. If you need help reducing stress, ask your health care provider. Do not use any products that contain nicotine or tobacco. These products include cigarettes, chewing tobacco, and vaping devices, such as e-cigarettes. If you need help quitting, ask your health care provider. Do not use illegal drugs. Alcohol use If you drink alcohol: Limit how much you have to: 0-1 drink a day for women who are not pregnant. 0-2 drinks a day  for men. Know how much alcohol is in a drink. In the [place] one drink equals one 12 oz bottle of  beer (355 mL), one 5 oz glass of wine (148 mL), or one 1 oz glass of hard liquor (44 mL). General instructions Take over-the-counter and prescription medicines only as told by your health care provider. Keep all follow-up visits. This is important. How is this prevented? In some cases, bradycardia may be prevented by: Treating underlying medical problems. Stopping behaviors or medicines that can trigger the condition. Contact a health care provider if: You feel light-headed or dizzy. You almost faint. You feel weak or are easily fatigued during physical activity. You experience confusion or have memory problems. Get help right away if: You faint. You have chest pains or an irregular heartbeat (palpitations). You have trouble breathing. These symptoms may represent a serious problem that is an emergency. Do not wait to see if the symptoms will go away. Get medical help right away. Call your local emergency services (911 in the [place] Do not drive yourself to the hospital. Summary Bradycardia is a slower-than-normal heartbeat. With bradycardia, the resting heart rate is less than 60 beats per minute. Treatment for this condition depends on the cause. Manage any health conditions that contribute to bradycardia as told by your health care provider. Do not use any products that contain nicotine or tobacco. These products include cigarettes, chewing tobacco, and vaping devices, such as e-cigarettes. Keep all follow-up visits. This is important. This information is not intended to replace advice given to you by your health care provider. Make sure you discuss any questions you have with your health care provider. Document Revised: 03/03/2021 Document Reviewed: 03/03/2021 Elsevier Patient Education  2024 [employer].

## 2024-05-31 LAB — CHLAMYDIA/GONOCOCCUS/TRICHOMONAS, NAA
Chlamydia by NAA: NEGATIVE
Gonococcus by NAA: NEGATIVE
Trich vag by NAA: NEGATIVE

## 2024-06-03 ENCOUNTER — Ambulatory Visit: Payer: Self-pay | Admitting: Internal Medicine

## 2024-06-03 LAB — ZINC: Zinc: 65 ug/dL (ref 60–130)

## 2024-06-03 LAB — RETICULOCYTES
ABS Retic: 53430 {cells}/uL (ref 25000–90000)
Retic Ct Pct: 1.3 %

## 2024-06-03 LAB — VITAMIN B1: Vitamin B1 (Thiamine): 14 nmol/L (ref 8–30)

## 2024-06-03 LAB — METHYLMALONIC ACID, SERUM: Methylmalonic Acid, Quant: 116 nmol/L (ref 55–335)

## 2024-06-03 LAB — CULTURE, URINE COMPREHENSIVE: RESULT:: NO GROWTH

## 2024-06-14 ENCOUNTER — Telehealth: Payer: Self-pay | Admitting: Neurology

## 2024-06-14 NOTE — Telephone Encounter (Signed)
 Patient called in, stated he needs an appointment, his PCP referred him to us . I pulled up chart and asked if this was for sleep and neuro, he stated he just needs a new CPAP and doesn't want to have to go through 1,000s of dollars of appointments in order to do that or he can just order one offline illegally. I let him know that I don't typically handle sleep appointments, but could get him over to our sleep dept to speak with him about it, he interrupted and stated he would just order one offline illegally and hung up.

## 2024-06-26 DIAGNOSIS — R001 Bradycardia, unspecified: Secondary | ICD-10-CM | POA: Diagnosis not present

## 2024-06-27 ENCOUNTER — Other Ambulatory Visit: Payer: Self-pay | Admitting: Internal Medicine

## 2024-06-27 DIAGNOSIS — I472 Ventricular tachycardia, unspecified: Secondary | ICD-10-CM | POA: Insufficient documentation

## 2024-07-06 ENCOUNTER — Ambulatory Visit: Payer: Self-pay

## 2024-07-06 NOTE — Telephone Encounter (Signed)
  FYI Only or Action Required?: FYI only for provider.  Patient was last seen in primary care on 05/30/2024 by Joshua Debby CROME, MD.  Called Nurse Triage reporting Leg Swelling.  Symptoms began several days ago.  Interventions attempted: Rest, hydration, or home remedies.  Symptoms are: unchanged.  Triage Disposition: See Physician Within 24 Hours  Patient/caregiver understands and will follow disposition?: Yes     Reason for Disposition  [1] MODERATE leg swelling (e.g., swelling extends up to knees) AND [2] new-onset or getting worse  Answer Assessment - Initial Assessment Questions If patient's legs elevated they get less swollen and firm   1. ONSET: When did the swelling start? (e.g., minutes, hours, days)     Friday 2. LOCATION: What part of the leg is swollen?  Are both legs swollen or just one leg?     Bilateral - lower portion and up to around knee 3. SEVERITY: How bad is the swelling? (e.g., localized; mild, moderate, severe)     It varies 4. REDNESS: Is there redness or signs of infection?     No 5. PAIN: Is the swelling painful to touch? If Yes, ask: How painful is it?   (Scale 1-10; mild, moderate or severe)     No pain, just discomfort 7. CAUSE: What do you think is causing the leg swelling?     No 8. MEDICAL HISTORY: Do you have a history of blood clots (e.g., DVT), cancer, heart failure, kidney disease, or liver failure?     Patient was wearing heart monitor and said he had arrythmias, has cardiology referral. No history of blood clots 9. RECURRENT SYMPTOM: Have you had leg swelling before? If Yes, ask: When was the last time? What happened that time?     No 10. OTHER SYMPTOMS: Do you have any other symptoms? (e.g., chest pain, difficulty breathing)       No  Protocols used: Leg Swelling and Edema-A-AH

## 2024-07-06 NOTE — Telephone Encounter (Signed)
 Per PAS pt had disconnected during transfer.   Copied from CRM [id number]. Topic: Clinical - Red Word Triage >> Jul 06, 2024  2:30 PM Ahlexyia S wrote: Red Word that prompted transfer to Nurse Triage: Pt called in to schedule appt with Dr. Joshua. Pt mentioned that he had a heart monitor for some heart issues. Pt also stated that both of his legs are swollen but one is worse than the other. This started on Friday. Warm transferred to nurse triage.

## 2024-07-08 ENCOUNTER — Ambulatory Visit: Payer: Self-pay | Admitting: Family Medicine

## 2024-07-08 ENCOUNTER — Encounter: Payer: Self-pay | Admitting: Family Medicine

## 2024-07-08 ENCOUNTER — Ambulatory Visit: Admitting: Family Medicine

## 2024-07-08 VITALS — BP 136/84 | HR 50 | Temp 97.8°F | Ht 71.0 in | Wt 247.2 lb

## 2024-07-08 DIAGNOSIS — M7989 Other specified soft tissue disorders: Secondary | ICD-10-CM | POA: Insufficient documentation

## 2024-07-08 DIAGNOSIS — I499 Cardiac arrhythmia, unspecified: Secondary | ICD-10-CM | POA: Insufficient documentation

## 2024-07-08 DIAGNOSIS — R001 Bradycardia, unspecified: Secondary | ICD-10-CM

## 2024-07-08 DIAGNOSIS — I10 Essential (primary) hypertension: Secondary | ICD-10-CM | POA: Diagnosis not present

## 2024-07-08 DIAGNOSIS — I119 Hypertensive heart disease without heart failure: Secondary | ICD-10-CM | POA: Diagnosis not present

## 2024-07-08 LAB — COMPREHENSIVE METABOLIC PANEL WITH GFR
ALT: 25 U/L (ref 0–53)
AST: 23 U/L (ref 0–37)
Albumin: 4 g/dL (ref 3.5–5.2)
Alkaline Phosphatase: 40 U/L (ref 39–117)
BUN: 11 mg/dL (ref 6–23)
CO2: 31 meq/L (ref 19–32)
Calcium: 9 mg/dL (ref 8.4–10.5)
Chloride: 104 meq/L (ref 96–112)
Creatinine, Ser: 0.78 mg/dL (ref 0.40–1.50)
GFR: 99.05 mL/min (ref >60.00)
Glucose, Bld: 87 mg/dL (ref 70–99)
Potassium: 3.8 meq/L (ref 3.5–5.1)
Sodium: 142 meq/L (ref 135–145)
Total Bilirubin: 0.4 mg/dL (ref 0.2–1.2)
Total Protein: 6.4 g/dL (ref 6.0–8.3)

## 2024-07-08 LAB — CK: Total CK: 74 U/L (ref 17–232)

## 2024-07-08 LAB — D-DIMER, QUANTITATIVE: D-DIMER: 0.27 mg{FEU}/L (ref 0.00–0.49)

## 2024-07-08 LAB — CBC WITH DIFFERENTIAL/PLATELET
Basophils Absolute: 0 K/uL (ref 0.0–0.1)
Basophils Relative: 0.6 % (ref 0.0–3.0)
Eosinophils Absolute: 0.1 K/uL (ref 0.0–0.7)
Eosinophils Relative: 1.7 % (ref 0.0–5.0)
HCT: 34.9 % — ABNORMAL LOW (ref 39.0–52.0)
Hemoglobin: 12.1 g/dL — ABNORMAL LOW (ref 13.0–17.0)
Lymphocytes Relative: 30.8 % (ref 12.0–46.0)
Lymphs Abs: 1.4 K/uL (ref 0.7–4.0)
MCHC: 34.8 g/dL (ref 30.0–36.0)
MCV: 92.6 fl (ref 78.0–100.0)
Monocytes Absolute: 0.4 K/uL (ref 0.1–1.0)
Monocytes Relative: 7.9 % (ref 3.0–12.0)
Neutro Abs: 2.7 K/uL (ref 1.4–7.7)
Neutrophils Relative %: 59 % (ref 43.0–77.0)
Platelets: 221 K/uL (ref 150.0–400.0)
RBC: 3.77 Mil/uL — ABNORMAL LOW (ref 4.22–5.81)
RDW: 14.8 % (ref 11.5–15.5)
WBC: 4.6 K/uL (ref 4.0–10.5)

## 2024-07-08 LAB — TROPONIN I (HIGH SENSITIVITY): High Sens Troponin I: 7 ng/L (ref 2–17)

## 2024-07-08 LAB — BRAIN NATRIURETIC PEPTIDE: Pro B Natriuretic peptide (BNP): 81 pg/mL (ref 0.0–100.0)

## 2024-07-08 NOTE — Progress Notes (Signed)
 Acute Office Visit  Subjective:     Patient ID: Rickey Hurley, male    DOB: [birth year], 58 y.o.   MRN: [id number]  Chief Complaint  Patient presents with   Leg Swelling    Pt stated that he experiencing some swelling which started from the leg to mid thigh on both leg for the first time, tight, uncomfortable and but no redness     HPI  58 year old male presents for evaluation of bilateral lower extremity swelling.  Noticed this over the last 4 to 5 days.  States that he has much improved then from when he first noticed it.  Swelling improves with elevation and compression socks. Also reports arrhythmia that he notices because his watch is telling him that his heart rate is irregular. Has completed long-term monitor with rate variability of 35 to 172 with episodes of SVT and 1 episode of VT. Requesting cardiology referral be sent to [facility], he is an employee.  Does have pending cardiology appointment with Dr. Elmira with [facility] October 13. Denies chest pain, shortness of breath, areas of erythema, tenderness to lower extremities, changes in urinary habits. Reports that he just does not feel as well as he is used to feeling, unsure of how to attribute symptoms.    ROS Per HPI      Objective:    BP 136/84   Pulse (!) 50   Temp 97.8 F (36.6 C) (Temporal)   Ht 5' 11 (1.803 m)   Wt 247 lb 4 oz (112.2 kg)   SpO2 99%   BMI 34.48 kg/m    Physical Exam Vitals and nursing note reviewed.  Constitutional:      General: He is not in acute distress.    Appearance: Normal appearance.  HENT:     Head: Normocephalic and atraumatic.     Right Ear: External ear normal.     Left Ear: External ear normal.     Nose: Nose normal.     Mouth/Throat:     Mouth: Mucous membranes are moist.     Pharynx: Oropharynx is clear.  Eyes:     Extraocular Movements: Extraocular movements intact.  Cardiovascular:     Rate and Rhythm: Normal rate. Rhythm irregular.     Pulses: Normal pulses.      Heart sounds: Normal heart sounds. No murmur heard. Pulmonary:     Effort: Pulmonary effort is normal. No respiratory distress.     Breath sounds: Normal breath sounds. No wheezing, rhonchi or rales.  Musculoskeletal:        General: Normal range of motion.     Cervical back: Normal range of motion.     Right lower leg: Edema (+1 pitting) present.     Left lower leg: Edema (+1 pitting) present.  Lymphadenopathy:     Cervical: No cervical adenopathy.  Skin:    General: Skin is warm and dry.  Neurological:     General: No focal deficit present.     Mental Status: He is alert and oriented to person, place, and time.  Psychiatric:        Mood and Affect: Mood normal.        Behavior: Behavior normal.   EKG: Indication: HTN, irregular rhythm Rate: 48 Interpretation: unspecific ST wave abnormality Changes from previous: no   No results found for any visits on 07/08/24.      Assessment & Plan:   Essential hypertension -     Ambulatory referral to Cardiology -  Brain natriuretic peptide -     CBC with Differential/Platelet -     CK -     Comprehensive metabolic panel with GFR  Bradycardia -     Ambulatory referral to Cardiology  LVH (left ventricular hypertrophy) due to hypertensive disease, without heart failure -     Ambulatory referral to Cardiology -     Troponin I (High Sensitivity); Future -     Brain natriuretic peptide -     CBC with Differential/Platelet -     Comprehensive metabolic panel with GFR  Swelling of lower leg -     Ambulatory referral to Cardiology -     Troponin I (High Sensitivity); Future -     EKG 12-Lead -     Brain natriuretic peptide -     CBC with Differential/Platelet -     CK -     Comprehensive metabolic panel with GFR -     D-dimer, quantitative  Irregular heart rhythm -     EKG 12-Lead       Orders Placed This Encounter  Procedures   Brain natriuretic peptide   CBC with Differential/Platelet    Release to patient:    Immediate [1]   CK (Creatine Kinase)   Comprehensive metabolic panel with GFR    Release to patient:   Immediate [1]   D-dimer, quantitative   Ambulatory referral to Cardiology    Referral Priority:   Emergency    Referral Type:   Consultation    Referral Reason:   Specialty Services Required    Requested Specialty:   Cardiology    Number of Visits Requested:   1   EKG 12-Lead     No orders of the defined types were placed in this encounter.   Return if symptoms worsen or fail to improve.  Corean LITTIE Ku, FNP

## 2024-07-08 NOTE — Patient Instructions (Signed)
 We are checking labs today, will be in contact with any results that require further attention  Your EKG today is very similar to the one from last month, no significant changes.  I have placed an urgent cardiology referral to [facility] for you today.   Someone will be reaching out to get you scheduled. If you do not hear from anyone over the next week, please give them a call.   Please go to the ER for any chest pain, shortness of breath, or other serious concerns for further workup if needed  Follow up with Dr Joshua as scheduled.

## 2024-07-25 ENCOUNTER — Other Ambulatory Visit: Payer: Self-pay | Admitting: Internal Medicine

## 2024-07-25 DIAGNOSIS — M1A09X0 Idiopathic chronic gout, multiple sites, without tophus (tophi): Secondary | ICD-10-CM

## 2024-07-25 DIAGNOSIS — I10 Essential (primary) hypertension: Secondary | ICD-10-CM

## 2024-07-30 ENCOUNTER — Encounter: Payer: Self-pay | Admitting: Internal Medicine

## 2024-08-01 ENCOUNTER — Other Ambulatory Visit: Payer: Self-pay

## 2024-08-01 DIAGNOSIS — I10 Essential (primary) hypertension: Secondary | ICD-10-CM

## 2024-08-01 MED ORDER — OLMESARTAN MEDOXOMIL 40 MG PO TABS: 40.0000 mg | ORAL_TABLET | Freq: Every day | ORAL | 1 refills | Status: AC

## 2024-08-02 ENCOUNTER — Other Ambulatory Visit: Payer: Self-pay | Admitting: Internal Medicine

## 2024-08-02 DIAGNOSIS — G8929 Other chronic pain: Secondary | ICD-10-CM

## 2024-09-08 ENCOUNTER — Ambulatory Visit: Admitting: Cardiology

## 2024-10-31 ENCOUNTER — Encounter: Payer: Self-pay | Admitting: Internal Medicine

## 2024-11-02 NOTE — Telephone Encounter (Signed)
 We can see him for a gout flare

## 2024-11-08 ENCOUNTER — Ambulatory Visit: Payer: Self-pay | Admitting: Emergency Medicine

## 2024-11-08 ENCOUNTER — Ambulatory Visit: Admitting: Emergency Medicine

## 2024-11-08 ENCOUNTER — Ambulatory Visit (INDEPENDENT_AMBULATORY_CARE_PROVIDER_SITE_OTHER)

## 2024-11-08 ENCOUNTER — Encounter: Payer: Self-pay | Admitting: Emergency Medicine

## 2024-11-08 VITALS — BP 140/100 | HR 63 | Temp 97.9°F | Ht 71.0 in | Wt 253.0 lb

## 2024-11-08 DIAGNOSIS — G8929 Other chronic pain: Secondary | ICD-10-CM

## 2024-11-08 DIAGNOSIS — M25571 Pain in right ankle and joints of right foot: Secondary | ICD-10-CM

## 2024-11-08 DIAGNOSIS — M19071 Primary osteoarthritis, right ankle and foot: Secondary | ICD-10-CM | POA: Insufficient documentation

## 2024-11-08 DIAGNOSIS — M109 Gout, unspecified: Secondary | ICD-10-CM

## 2024-11-08 MED ORDER — COLCHICINE 0.6 MG PO TABS: 0.6000 mg | ORAL_TABLET | Freq: Two times a day (BID) | ORAL | 1 refills | Status: AC

## 2024-11-08 MED ORDER — OXYCODONE-ACETAMINOPHEN 5-325 MG PO TABS: 1.0000 | ORAL_TABLET | ORAL | 0 refills | Status: AC | PRN

## 2024-11-08 MED ORDER — PREDNISONE 20 MG PO TABS: 40.0000 mg | ORAL_TABLET | Freq: Every day | ORAL | 1 refills | Status: AC

## 2024-11-08 NOTE — Patient Instructions (Signed)
 Gout  Gout is painful swelling of your joints. Gout is a type of arthritis. It is caused by having too much uric acid in your body. Uric acid is a chemical that is made when your body breaks down substances called purines. If your body has too much uric acid, sharp crystals can form and build up in your joints. This causes pain and swelling. Gout attacks can happen quickly and be very painful (acute gout). Over time, the attacks can affect more joints and happen more often (chronic gout). What are the causes? Gout is caused by too much uric acid in your blood. This can happen because: Your kidneys do not remove enough uric acid from your blood. Your body makes too much uric acid. You eat too many foods that are high in purines. These foods include organ meats, some seafood, and beer. Trauma or stress can bring on an attack. What increases the risk? Having a family history of gout. Being male and middle-aged. Being male and having gone through menopause. Having an organ transplant. Taking certain medicines. Having certain conditions, such as: Being very overweight (obese). Lead poisoning. Kidney disease. A skin condition called psoriasis. Other risks include: Losing weight too quickly. Not having enough water in the body (being dehydrated). Drinking alcohol, especially beer. Drinking beverages that are sweetened with a type of sugar called fructose. What are the signs or symptoms? An attack of acute gout often starts at night and usually happens in just one joint. The most common place is the big toe. Other joints that may be affected include joints of the feet, ankle, knee, fingers, wrist, or elbow. Symptoms may include: Very bad pain. Warmth. Swelling. Stiffness. Tenderness. The affected joint may be very painful to touch. Shiny, red, or purple skin. Chills and fever. Chronic gout may cause symptoms more often. More joints may be involved. You may also have white or yellow lumps  (tophi) on your hands or feet or in other areas near your joints. How is this treated? Treatment for an acute attack may include medicines for pain and swelling, such as: NSAIDs, such as ibuprofen. Steroids taken by mouth or injected into a joint. Colchicine. This can be given by mouth or through an IV tube. Treatment to prevent future attacks may include: Taking small doses of NSAIDs or colchicine daily. Using a medicine that reduces uric acid levels in your blood, such as allopurinol. Making changes to your diet. You may need to see a food expert (dietitian) about what to eat and drink to prevent gout. Follow these instructions at home: During a gout attack  If told, put ice on the painful area. To do this: Put ice in a plastic bag. Place a towel between your skin and the bag. Leave the ice on for 20 minutes, 2-3 times a day. Take off the ice if your skin turns bright red. This is very important. If you cannot feel pain, heat, or cold, you have a greater risk of damage to the area. Raise the painful joint above the level of your heart as often as you can. Rest the joint as much as possible. If the joint is in your leg, you may be given crutches. Follow instructions from your doctor about what you cannot eat or drink. Avoiding future gout attacks Eat a low-purine diet. Avoid foods and drinks such as: Liver. Kidney. Anchovies. Asparagus. Herring. Mushrooms. Mussels. Beer. Stay at a healthy weight. If you want to lose weight, talk with your doctor. Do not  lose weight too fast. Start or continue an exercise plan as told by your doctor. Eating and drinking Avoid drinks sweetened by fructose. Drink enough fluids to keep your pee (urine) pale yellow. If you drink alcohol: Limit how much you have to: 0-1 drink a day for women who are not pregnant. 0-2 drinks a day for men. Know how much alcohol is in a drink. In the [place] one drink equals one 12 oz bottle of beer (355 mL), one 5 oz  glass of wine (148 mL), or one 1 oz glass of hard liquor (44 mL). General instructions Take over-the-counter and prescription medicines only as told by your doctor. Ask your doctor if you should avoid driving or using machines while you are taking your medicine. Return to your normal activities when your doctor says that it is safe. Keep all follow-up visits. Where to find more information [employer]: www.niams.http://www.myers.net/ Contact a doctor if: You have another gout attack. You still have symptoms of a gout attack after 10 days of treatment. You have problems (side effects) because of your medicines. You have chills or a fever. You have burning pain when you pee (urinate). You have pain in your lower back or belly. Get help right away if: You have very bad pain. Your pain cannot be controlled. You cannot pee. Summary Gout is painful swelling of the joints. The most common site of pain is the big toe, but it can affect other joints. Medicines and avoiding some foods can help to prevent and treat gout attacks. This information is not intended to replace advice given to you by your health care provider. Make sure you discuss any questions you have with your health care provider. Document Revised: 08/14/2021 Document Reviewed: 08/14/2021 Elsevier Patient Education  2024 [employer].

## 2024-11-08 NOTE — Assessment & Plan Note (Signed)
 Pain management discussed Presently using ibuprofen Ran out of colchicine  and prednisone 

## 2024-11-08 NOTE — Assessment & Plan Note (Addendum)
 Gout management discussed Recommend colchicine  0.6 mg twice a day for 5 days along with prednisone  40 mg daily for 5 days Use Percocet as needed for moderate to severe pain Recommend rheumatology evaluation.  Referral placed today. Normal recent uric acid levels and normal recent kidney function tests

## 2024-11-08 NOTE — Progress Notes (Signed)
 Rickey Hurley 58 y.o.   Chief Complaint  Patient presents with   gout flare up    Pt states that today make it 16 days that this flare up has started. Pt states that this is the worst flare up it located on his ankle (right) pt is needing a referral [facility] a specialist [facility]     HISTORY OF PRESENT ILLNESS: This is a 58 y.o. male with history of gout complaining of pain to right ankle Same on-call Rickey Hurley had surgery on years ago.  Has had some chronic pain in that area Requesting referral to rheumatologist of choice Also needs advice regarding pain management States prednisone , colchicine , Percocet helps No recent injuries Painful walking.  No other associated symptoms No other complaints or medical concerns today.  HPI   Prior to Admission medications  Medication Sig Start Date End Date Taking? Authorizing Provider  colchicine  0.6 MG tablet Take 1 tablet (0.6 mg total) by mouth 2 (two) times daily. Take 1.2 mg now and 0.6 mg one hour later. Take 0.6 mg daily after that x 5 days. 11/08/24 11/13/24 Yes Avelina Mcclurkin, Emil Schanz, MD  oxyCODONE -acetaminophen  (PERCOCET/ROXICET) 5-325 MG tablet Take 1 tablet by mouth every 4 (four) hours as needed for up to 5 days for severe pain (pain score 7-10). 11/08/24 11/13/24 Yes Nekhi Liwanag, Emil Schanz, MD  predniSONE  (DELTASONE ) 20 MG tablet Take 2 tablets (40 mg total) by mouth daily with breakfast for 7 days. 11/08/24 11/15/24 Yes Purcell Emil Schanz, MD  allopurinol  (ZYLOPRIM ) 300 MG tablet TAKE 1 TABLET DAILY 08/02/24   Joshua Debby CROME, MD  Multiple Vitamin (MULTIVITAMIN ADULT PO) Multivitamin    [provider]  olmesartan  (BENICAR ) 40 MG tablet Take 1 tablet (40 mg total) by mouth daily. 08/01/24   Joshua Debby CROME, MD  Omega-3 Fatty Acids (FISH OIL OMEGA-3 PO) 1,400 mg.    [provider]  tiZANidine  (ZANAFLEX ) 4 MG capsule TAKE 1 CAPSULE 3 TIMES A   DAY AS NEEDED FOR MUSCLE   SPASMS 08/02/24   Joshua Debby CROME, MD     Allergies[1]  Patient Active Problem List   Diagnosis Date Noted   Irregular heart rhythm 07/08/2024   Swelling of lower leg 07/08/2024   V tach (HCC) 06/27/2024   Deficiency anemia 05/30/2024   Vitamin B12 deficiency anemia due to intrinsic factor deficiency 05/30/2024   Dysuria 05/30/2024   Prostatitis, acute 05/30/2024   Bradycardia 05/30/2024   Bradycardia with 41-50 beats per minute 05/30/2024   Renal cyst 04/06/2024   Kidney stone on left side 03/25/2024   OSA on CPAP 09/24/2023   LVH (left ventricular hypertrophy) due to hypertensive disease, without heart failure 09/24/2023   Polyp of colon 09/24/2023   Encounter for general adult medical examination with abnormal findings 03/11/2021   Chronic left-sided low back pain with left-sided sciatica 05/14/2020   Tinnitus of both ears 07/11/2019   Vitamin D  deficiency 07/11/2019   Pure hyperglyceridemia 05/20/2018   Excessive daytime sleepiness 01/06/2018   Other seasonal allergic rhinitis 06/18/2016   Left lumbar radiculitis 08/22/2015   Gout 12/21/2009   Essential hypertension 12/21/2009   PAC 12/21/2009    Past Medical History:  Diagnosis Date   Anal fistula 12/21/2009   Anemia    Arthritis    Asthma    CLOSTRIDIUM DIFFICILE COLITIS 04/19/2010   COLONIC POLYPS, HX OF 12/21/2009   Complication of anesthesia    trouble keeping pt asleep.    CONJUNCTIVITIS, ACUTE 04/11/2010  GOUT 12/21/2009   H/O viral pericarditis    HYPERLIPIDEMIA 12/21/2009   HYPERTENSION 12/21/2009   PAC 12/21/2009   SINUSITIS- ACUTE-NOS 04/11/2010   Sleep apnea     Past Surgical History:  Procedure Laterality Date   anal lateral inferior sphincterotomy from c diff     ANKLE ARTHROSCOPY Right 12/28/2020   Procedure: RIGHT ANKLE ARTHROSCOPY, DEBRIDEMENT AND EXCISION BONE SPUR;  Surgeon: Harden Jerona GAILS, MD;  Location: [facility] OR;  Service: Orthopedics;  Laterality: Right;   ANKLE DEBRIDEMENT Right    right ankle ORIF      Social History    Socioeconomic History   Marital status: Single    Spouse name: Not on file   Number of children: 0   Years of education: Not on file   Highest education level: Doctorate  Occupational History   Occupation: vice Management Consultant: [employer]  Tobacco Use   Smoking status: Never   Smokeless tobacco: Never  Vaping Use   Vaping status: Never Used  Substance and Sexual Activity   Alcohol use: Yes    Comment: socially   Drug use: No   Sexual activity: Yes    Birth control/protection: None  Other Topics Concern   Not on file  Social History Narrative   Not on file   Social Drivers of Health   Tobacco Use: Low Risk (11/08/2024)   Patient History    Smoking Tobacco Use: Never    Smokeless Tobacco Use: Never    Passive Exposure: Not on file  Financial Resource Strain: Low Risk (11/07/2024)   Overall Financial Resource Strain (CARDIA)    Difficulty of Paying Living Expenses: Not hard at all  Food Insecurity: No Food Insecurity (11/07/2024)   Epic    Worried About Radiation Protection Practitioner of Food in the Last Year: Never true    Ran Out of Food in the Last Year: Never true  Transportation Needs: No Transportation Needs (11/07/2024)   Epic    Lack of Transportation (Medical): No    Lack of Transportation (Non-Medical): No  Physical Activity: Sufficiently Active (11/07/2024)   Exercise Vital Sign    Days of Exercise per Week: 3 days    Minutes of Exercise per Session: 50 min  Stress: No Stress Concern Present (11/07/2024)   [employer] of Occupational Health - Occupational Stress Questionnaire    Feeling of Stress: Only a little  Social Connections: Unknown (11/07/2024)   Social Connection and Isolation Panel    Frequency of Communication with Friends and Family: Twice a week    Frequency of Social Gatherings with Friends and Family: Twice a week    Attends Religious Services: Never    Database Administrator or Organizations: Yes    Attends Hospital Doctor: More than 4 times per year    Marital Status: Patient declined  Intimate Partner Violence: Unknown (02/27/2022)   Received from [facility]   HITS    Physically Hurt: Not on file    Insult or Talk Down To: Not on file    Threaten Physical Harm: Not on file    Scream or Curse: Not on file  Depression (PHQ2-9): Low Risk (11/08/2024)   Depression (PHQ2-9)    PHQ-2 Score: 0  Alcohol Screen: Low Risk (11/07/2024)   Alcohol Screen    Last Alcohol Screening Score (AUDIT): 2  Housing: Unknown (11/07/2024)   Epic    Unable to Pay for Housing in the Last Year: No  Number of Times Moved in the Last Year: Not on file    Homeless in the Last Year: No  Utilities: Not on file  Health Literacy: Not on file    Family History  Adopted: Yes  Problem Relation Age of Onset   Cancer Neg Hx    Diabetes Neg Hx    Early death Neg Hx    Hearing loss Neg Hx    Heart disease Neg Hx    Hyperlipidemia Neg Hx    Hypertension Neg Hx    Stroke Neg Hx      Review of Systems  Constitutional: Negative.  Negative for chills and fever.  HENT:  Negative for congestion and sore throat.   Respiratory: Negative.  Negative for cough and shortness of breath.   Cardiovascular: Negative.  Negative for chest pain and palpitations.  Gastrointestinal:  Negative for abdominal pain, nausea and vomiting.  Musculoskeletal:  Positive for joint pain (Chronic right ankle pain).  Skin: Negative.  Negative for rash.  Neurological: Negative.  Negative for dizziness and headaches.  All other systems reviewed and are negative.   Vitals:   11/08/24 0828 11/08/24 0835  BP: (!) 140/100 (!) 140/100  Pulse: 63   Temp: 97.9 F (36.6 C)   SpO2: 98%     Physical Exam Vitals reviewed.  Constitutional:      Appearance: Normal appearance.  Eyes:     Extraocular Movements: Extraocular movements intact.  Cardiovascular:     Rate and Rhythm: Normal rate.  Pulmonary:     Effort: Pulmonary effort is normal.   Musculoskeletal:     Comments: Right ankle: Painful range of motion.  No erythema or ecchymosis.  No significant swelling.  Old surgical scars noted.  Mild tenderness to deep palpation medial ankle Neurovascularly intact.  No findings of gout on first metatarsophalangeal joint  Skin:    General: Skin is warm and dry.  Neurological:     Mental Status: Rickey Hurley is alert and oriented to person, place, and time.  Psychiatric:        Mood and Affect: Mood normal.        Behavior: Behavior normal.      ASSESSMENT & PLAN: Problem List Items Addressed This Visit       Musculoskeletal and Integument   Acute gouty arthritis - Primary   Gout management discussed Recommend colchicine  0.6 mg twice a day for 5 days along with prednisone  40 mg daily for 5 days Use Percocet as needed for moderate to severe pain Recommend rheumatology evaluation.  Referral placed today. Normal recent uric acid levels and normal recent kidney function tests      Relevant Medications   predniSONE  (DELTASONE ) 20 MG tablet   colchicine  0.6 MG tablet   oxyCODONE -acetaminophen  (PERCOCET/ROXICET) 5-325 MG tablet   Other Relevant Orders   Ambulatory referral to Rheumatology   DG Ankle Complete Right (Completed)   Arthritis of right ankle   As seen on x-ray done today Pain management discussed Recommend rheumatology evaluation.  Referral placed today.      Relevant Medications   predniSONE  (DELTASONE ) 20 MG tablet   colchicine  0.6 MG tablet   oxyCODONE -acetaminophen  (PERCOCET/ROXICET) 5-325 MG tablet   Other Relevant Orders   Ambulatory referral to Rheumatology   DG Ankle Complete Right (Completed)     Other   Chronic pain of right ankle   Pain management discussed Presently using ibuprofen Ran out of colchicine  and prednisone       Relevant Medications   predniSONE  (  DELTASONE ) 20 MG tablet   oxyCODONE -acetaminophen  (PERCOCET/ROXICET) 5-325 MG tablet   Other Relevant Orders   DG Ankle Complete Right  (Completed)   Patient Instructions  Gout  Gout is painful swelling of your joints. Gout is a type of arthritis. It is caused by having too much uric acid in your body. Uric acid is a chemical that is made when your body breaks down substances called purines. If your body has too much uric acid, sharp crystals can form and build up in your joints. This causes pain and swelling. Gout attacks can happen quickly and be very painful (acute gout). Over time, the attacks can affect more joints and happen more often (chronic gout). What are the causes? Gout is caused by too much uric acid in your blood. This can happen because: Your kidneys do not remove enough uric acid from your blood. Your body makes too much uric acid. You eat too many foods that are high in purines. These foods include organ meats, some seafood, and beer. Trauma or stress can bring on an attack. What increases the risk? Having a family history of gout. Being male and middle-aged. Being male and having gone through menopause. Having an organ transplant. Taking certain medicines. Having certain conditions, such as: Being very overweight (obese). Lead poisoning. Kidney disease. A skin condition called psoriasis. Other risks include: Losing weight too quickly. Not having enough water in the body (being dehydrated). Drinking alcohol, especially beer. Drinking beverages that are sweetened with a type of sugar called fructose. What are the signs or symptoms? An attack of acute gout often starts at night and usually happens in just one joint. The most common place is the big toe. Other joints that may be affected include joints of the feet, ankle, knee, fingers, wrist, or elbow. Symptoms may include: Very bad pain. Warmth. Swelling. Stiffness. Tenderness. The affected joint may be very painful to touch. Shiny, red, or purple skin. Chills and fever. Chronic gout may cause symptoms more often. More joints may be involved.  You may also have white or yellow lumps (tophi) on your hands or feet or in other areas near your joints. How is this treated? Treatment for an acute attack may include medicines for pain and swelling, such as: NSAIDs, such as ibuprofen. Steroids taken by mouth or injected into a joint. Colchicine . This can be given by mouth or through an IV tube. Treatment to prevent future attacks may include: Taking small doses of NSAIDs or colchicine  daily. Using a medicine that reduces uric acid levels in your blood, such as allopurinol . Making changes to your diet. You may need to see a food expert (dietitian) about what to eat and drink to prevent gout. Follow these instructions at home: During a gout attack  If told, put ice on the painful area. To do this: Put ice in a plastic bag. Place a towel between your skin and the bag. Leave the ice on for 20 minutes, 2-3 times a day. Take off the ice if your skin turns bright red. This is very important. If you cannot feel pain, heat, or cold, you have a greater risk of damage to the area. Raise the painful joint above the level of your heart as often as you can. Rest the joint as much as possible. If the joint is in your leg, you may be given crutches. Follow instructions from your doctor about what you cannot eat or drink. Avoiding future gout attacks Eat a low-purine diet. Avoid foods  and drinks such as: Liver. Kidney. Anchovies. Asparagus. Herring. Mushrooms. Mussels. Beer. Stay at a healthy weight. If you want to lose weight, talk with your doctor. Do not lose weight too fast. Start or continue an exercise plan as told by your doctor. Eating and drinking Avoid drinks sweetened by fructose. Drink enough fluids to keep your pee (urine) pale yellow. If you drink alcohol: Limit how much you have to: 0-1 drink a day for women who are not pregnant. 0-2 drinks a day for men. Know how much alcohol is in a drink. In the [place] one drink equals one  12 oz bottle of beer (355 mL), one 5 oz glass of wine (148 mL), or one 1 oz glass of hard liquor (44 mL). General instructions Take over-the-counter and prescription medicines only as told by your doctor. Ask your doctor if you should avoid driving or using machines while you are taking your medicine. Return to your normal activities when your doctor says that it is safe. Keep all follow-up visits. Where to find more information [employer] of [employer]: www.niams.http://www.myers.net/ Contact a doctor if: You have another gout attack. You still have symptoms of a gout attack after 10 days of treatment. You have problems (side effects) because of your medicines. You have chills or a fever. You have burning pain when you pee (urinate). You have pain in your lower back or belly. Get help right away if: You have very bad pain. Your pain cannot be controlled. You cannot pee. Summary Gout is painful swelling of the joints. The most common site of pain is the big toe, but it can affect other joints. Medicines and avoiding some foods can help to prevent and treat gout attacks. This information is not intended to replace advice given to you by your health care provider. Make sure you discuss any questions you have with your health care provider. Document Revised: 08/14/2021 Document Reviewed: 08/14/2021 Elsevier Patient Education  2024 Elsevier Inc.     Emil Schaumann, MD [facility]    [1]  Allergies Allergen Reactions   Lisinopril  Cough   Moxifloxacin     REACTION: c diff with subsequent bowel resection

## 2024-11-08 NOTE — Assessment & Plan Note (Signed)
 As seen on x-ray done today Pain management discussed Recommend rheumatology evaluation.  Referral placed today.

## 2024-12-01 NOTE — Telephone Encounter (Signed)
 Referral closed 12/01/2024
